# Patient Record
Sex: Male | Born: 1950 | Hispanic: No | Marital: Single | Smoking: Current every day smoker
Health system: Southern US, Community
[De-identification: ages and names within clinical notes are randomized; demographics above are authoritative.]

---

## 1999-02-02 ENCOUNTER — Encounter: Admission: RE | Admit: 1999-02-02 | Discharge: 1999-02-02 | Payer: Self-pay | Admitting: Neurosurgery

## 1999-02-02 ENCOUNTER — Encounter: Payer: Self-pay | Admitting: Neurosurgery

## 2009-04-27 ENCOUNTER — Ambulatory Visit: Payer: Self-pay | Admitting: Gastroenterology

## 2009-10-16 ENCOUNTER — Ambulatory Visit: Payer: Self-pay | Admitting: Family

## 2011-12-27 DIAGNOSIS — B1920 Unspecified viral hepatitis C without hepatic coma: Secondary | ICD-10-CM | POA: Insufficient documentation

## 2011-12-27 DIAGNOSIS — Z720 Tobacco use: Secondary | ICD-10-CM | POA: Insufficient documentation

## 2011-12-27 DIAGNOSIS — F1011 Alcohol abuse, in remission: Secondary | ICD-10-CM | POA: Insufficient documentation

## 2011-12-27 DIAGNOSIS — G8929 Other chronic pain: Secondary | ICD-10-CM | POA: Insufficient documentation

## 2012-06-28 ENCOUNTER — Ambulatory Visit: Payer: Self-pay | Admitting: Gastroenterology

## 2012-07-04 DIAGNOSIS — Z0189 Encounter for other specified special examinations: Secondary | ICD-10-CM | POA: Insufficient documentation

## 2013-07-05 DIAGNOSIS — Z800 Family history of malignant neoplasm of digestive organs: Secondary | ICD-10-CM | POA: Insufficient documentation

## 2013-09-09 ENCOUNTER — Ambulatory Visit: Payer: Self-pay | Admitting: Pain Medicine

## 2018-02-28 ENCOUNTER — Institutional Professional Consult (permissible substitution): Payer: Self-pay | Admitting: Internal Medicine

## 2018-03-13 ENCOUNTER — Ambulatory Visit
Admission: RE | Admit: 2018-03-13 | Discharge: 2018-03-13 | Disposition: A | Discharge status: Home / Self Care | Payer: Medicare HMO | Source: Ambulatory Visit | Attending: Pulmonary Disease | Admitting: Pulmonary Disease

## 2018-03-13 ENCOUNTER — Encounter: Payer: Self-pay | Admitting: Pulmonary Disease

## 2018-03-13 ENCOUNTER — Ambulatory Visit (INDEPENDENT_AMBULATORY_CARE_PROVIDER_SITE_OTHER): Payer: Medicare HMO | Admitting: Pulmonary Disease

## 2018-03-13 VITALS — BP 126/78 | HR 91 | Ht 76.0 in | Wt 149.0 lb

## 2018-03-13 DIAGNOSIS — F17210 Nicotine dependence, cigarettes, uncomplicated: Secondary | ICD-10-CM

## 2018-03-13 DIAGNOSIS — J439 Emphysema, unspecified: Secondary | ICD-10-CM

## 2018-03-13 DIAGNOSIS — R05 Cough: Secondary | ICD-10-CM

## 2018-03-13 DIAGNOSIS — R059 Cough, unspecified: Secondary | ICD-10-CM

## 2018-03-13 DIAGNOSIS — J449 Chronic obstructive pulmonary disease, unspecified: Secondary | ICD-10-CM | POA: Insufficient documentation

## 2018-03-13 DIAGNOSIS — R634 Abnormal weight loss: Secondary | ICD-10-CM

## 2018-03-13 DIAGNOSIS — Z7289 Other problems related to lifestyle: Secondary | ICD-10-CM

## 2018-03-13 DIAGNOSIS — Z789 Other specified health status: Secondary | ICD-10-CM

## 2018-03-13 MED ORDER — IPRATROPIUM-ALBUTEROL 20-100 MCG/ACT IN AERS: 1.0000 | INHALATION_SPRAY | Freq: Four times a day (QID) | RESPIRATORY_TRACT | 1 refills | Status: DC

## 2018-03-13 NOTE — Progress Notes (Signed)
Subjective:    Patient ID: Charles Ray, male    DOB: [birth year], 67 y.o.   MRN: [id number]  HPI The patient is 67 year old current smoker (1 PPD) who presents for evaluation of a cough that has been present for over a year.  He states that the cough is "constant" and is worse when he is moving around.  He states that albuterol improves it.  He does produce sputum particularly in the mornings this can be brownish to gray.  He has not seen any admixed blood.  Over the last 4 to 5 months he has lost approximately 20 pounds with no loss in appetite.  His last chest x-ray of record was in 2017.  He has had no chest imaging since.  He has never been given a diagnosis of COPD that he is aware of.  He has not had any orthopnea, paroxysmal nocturnal dyspnea or chest pain.  Does describe occasional lower extremity edema which usually resolves by elevating the lower extremities.  Does not endorse any fevers, chills or sweats.  He did does not endorse any other symptomatology.  Past medical history, surgical history and family history were reviewed and are as noted.  The patient smokes a pack of cigarettes per day has done so since age 45.  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  He has a dog as a pet no exotic pets or birds.  No unusual hobbies.  Has never been in the [employer].  He has lived in [place] all of his life.  He has not traveled outside the country no history of tuberculosis.  He does drink at least 3 glasses of "hard liquor" 3-4 times a week.   Review of Systems  Constitutional: Positive for unexpected weight change (Weight loss).  HENT: Negative.   Eyes: Negative.   Respiratory: Positive for cough.   Cardiovascular: Negative.   Gastrointestinal: Negative.   Endocrine: Negative.   Genitourinary: Negative.   Musculoskeletal: Negative.   Skin: Negative.   Allergic/Immunologic: Negative.   Neurological: Negative.   Hematological: Negative.     Psychiatric/Behavioral: Negative.   All other systems reviewed and are negative.      Objective:   Physical Exam  Constitutional: He is oriented to person, place, and time. He appears well-developed. He appears cachectic.  Non-toxic appearance. He appears ill (Chronically ill-appearing). No distress.  HENT:  Head: Normocephalic and atraumatic.  Right Ear: External ear normal.  Left Ear: External ear normal.  Nose: Nose normal.  Eyes: Pupils are equal, round, and reactive to light. EOM are normal. No scleral icterus.  Neck: Neck supple. No JVD present. No tracheal deviation present. No thyromegaly present.  Cardiovascular: Normal rate, regular rhythm, normal heart sounds and intact distal pulses.  Pulmonary/Chest: Effort normal and breath sounds normal. No respiratory distress.  Abdominal: Soft. He exhibits no distension.  Musculoskeletal: Normal range of motion. He exhibits no edema or tenderness.  Lymphadenopathy:    He has no cervical adenopathy.  Neurological: He is alert and oriented to person, place, and time.  No focal deficit  Skin: Skin is warm and dry. No rash noted. No erythema.  Psychiatric: He has a normal mood and affect. His behavior is normal. Judgment and thought content normal.    Spirometry was performed today shows no overt obstruction mid flows are reduced.  Cannot exclude mild concomitant restriction.  Chest x-ray was performed: Formal interpretation is pending but no overt masses noted.  Patient does have hyperinflation and interstitial changes consistent with chronic lung disease.      Assessment & Plan:   1.  COPD with chronic bronchitis and emphysema by clinical impression: Suspect that he has preserved FEV1 but need to obtain lung volumes, airway resistance and diffusion capacity determinations.  He will be scheduled for full PFTs.  Or now we will start Combivent 1 inhalation 4 times a day he appears to have improvement in his cough with  bronchodilators.  2.  Cough: This is likely due to poorly controlled COPD as well as constant irritation due to tobacco use.  Cannot exclude reflux associated with alcohol intake.  I have advised the patient to curtail cigarette smoking and to decrease alcohol intake.  He was also instructed on proper antireflux measures.  Started Combivent as above as he does note improvement with inhalers.  3.  Recent unexplained weight loss: Chest x-ray does not show evidence of mass however formal interpretation is pending.  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  4.  Tobacco dependence due to cigarettes: Patient was counseled with regards to discontinuation of smoking total time for counseling was 3 to 5 minutes.  5.  Alcohol use: Patient was advised to limit use of alcohol.  Thank you for allowing me to participate in this patient's care.

## 2018-03-13 NOTE — Patient Instructions (Addendum)
1.  You will be scheduled for breathing test.  2.  You will be scheduled to have a chest x-ray.  3.  We will see you in follow-up in 3 to 4 weeks time you are to contact us prior to that time should any new difficulties arise.

## 2018-03-29 ENCOUNTER — Ambulatory Visit: Payer: Medicare HMO | Attending: Pulmonary Disease

## 2018-03-29 DIAGNOSIS — J449 Chronic obstructive pulmonary disease, unspecified: Secondary | ICD-10-CM | POA: Insufficient documentation

## 2018-03-29 DIAGNOSIS — R05 Cough: Secondary | ICD-10-CM

## 2018-03-29 DIAGNOSIS — R059 Cough, unspecified: Secondary | ICD-10-CM

## 2018-03-29 MED ORDER — ALBUTEROL SULFATE (2.5 MG/3ML) 0.083% IN NEBU
2.5000 mg | INHALATION_SOLUTION | Freq: Once | RESPIRATORY_TRACT | Status: AC
  Administered 2018-03-29: 2.5 mg via RESPIRATORY_TRACT
  Filled 2018-03-29: qty 3

## 2018-04-05 ENCOUNTER — Ambulatory Visit: Payer: Medicare HMO | Admitting: Pulmonary Disease

## 2018-04-05 ENCOUNTER — Encounter: Payer: Self-pay | Admitting: Pulmonary Disease

## 2018-04-05 VITALS — BP 142/78 | HR 87 | Ht 76.0 in | Wt 157.0 lb

## 2018-04-05 DIAGNOSIS — R05 Cough: Secondary | ICD-10-CM | POA: Diagnosis not present

## 2018-04-05 DIAGNOSIS — R059 Cough, unspecified: Secondary | ICD-10-CM

## 2018-04-05 DIAGNOSIS — Z7289 Other problems related to lifestyle: Secondary | ICD-10-CM

## 2018-04-05 DIAGNOSIS — R634 Abnormal weight loss: Secondary | ICD-10-CM | POA: Diagnosis not present

## 2018-04-05 DIAGNOSIS — F17210 Nicotine dependence, cigarettes, uncomplicated: Secondary | ICD-10-CM

## 2018-04-05 DIAGNOSIS — Z789 Other specified health status: Secondary | ICD-10-CM

## 2018-04-05 DIAGNOSIS — J449 Chronic obstructive pulmonary disease, unspecified: Secondary | ICD-10-CM

## 2018-04-05 MED ORDER — UMECLIDINIUM-VILANTEROL 62.5-25 MCG/INH IN AEPB: 1.0000 | INHALATION_SPRAY | Freq: Every day | RESPIRATORY_TRACT | 6 refills | Status: DC

## 2018-04-05 NOTE — Progress Notes (Signed)
Patient seen in the office today and instructed on use of Anoro.  Patient expressed understanding and demonstrated technique. 

## 2018-04-05 NOTE — Progress Notes (Signed)
Subjective:    Patient ID: Charles Ray, male    DOB: Dec 20, 1950, 67 y.o.   MRN: [id number]  HPI The patient is 67 year old current smoker (1 PPD) who presents for follow-up of a cough that has been present for over a year.  We first evaluated him on 3 December. He states that the cough is "constant" and is worse when he is moving around. He states that albuterol improves it.  He does produce sputum particularly in the mornings this can be brownish to gray.  Since his prior visit he has been using albuterol regularly and has noted some improvement in his cough. He has not seen any hemoptysis.  Over the last 4 to 5 months he has lost approximately 20 pounds with no loss in appetite.  However, he is now regaining this weight. His last chest x-ray of record was in 2017, this was repeated on 3 December and showed findings consistent with emphysema but no mass. He has not had any orthopnea, paroxysmal nocturnal dyspnea or chest pain.  Does describe occasional lower extremity edema which usually resolves by elevating the lower extremities.  Does not endorse any fevers, chills or sweats.  He did does not endorse any other symptomatology.  He had pulmonary function testing on 19 December that shows mild obstructive defect by FEV1/FVC criteria, no significant bronchodilator response and moderate decrease in diffusion capacity.  This is consistent with emphysema with relatively preserved FEV1.  As noted previously he does consume alcohol daily.  Review of Systems 10 point review of systems was performed and is as noted above otherwise negative.    Objective:   Physical Exam  Constitutional: He is oriented to person, place, and time. He appears well-developed. He appears  thin.  Non-toxic appearance. He appears ill (Chronically ill-appearing), appears older than stated age. No distress.  HENT:  Head: Normocephalic and atraumatic.  Right Ear: External ear normal.  Left Ear: External ear normal.  Nose:  Nose normal.  Eyes: Pupils are equal, round, and reactive to light. EOM are normal. No scleral icterus.  Neck: Neck supple. No JVD present. No tracheal deviation present. No thyromegaly present.  Cardiovascular: Normal rate, regular rhythm, normal heart sounds and intact distal pulses.  Pulmonary/Chest: Effort normal and breath sounds normal. No respiratory distress.  Abdominal: Soft. He exhibits no distension.  Musculoskeletal: Normal range of motion. He exhibits no edema or tenderness.  Lymphadenopathy:    He has no cervical adenopathy.  Neurological: He is alert and oriented to person, place, and time.  No focal deficit  Skin: Skin is warm and dry. No rash noted. No erythema.  Psychiatric: He has a normal mood and affect. His behavior is normal. Judgment and thought content normal.       Assessment & Plan:   1.  COPD with chronic bronchitis and emphysema by clinical impression:  He has mild struct of defect by FEV1/FVC criteria.  Mild to moderate diffusion capacity impairment, consistent with emphysema with relatively preserved FEV1.  He did not get his Combivent inhaler filled.  We will discontinue this and start Anoro Ellipta, 1 inhalation daily.  The patient was taught the use of the dry powder inhaler as well as was provided with a coupon for a free month's worth.  We will see him in follow-up in 3 months time he is to contact us prior to that time should any new difficulties arise.  2.  Cough:  His cough is somewhat improved with use of albuterol  hopefully Anora will help ameliorate the symptom.  It is highly recommended that he discontinue smoking and this was discussed with him as he will continue to irritate his airway and likely perpetuate the symptom.  3.  Recent unexplained weight loss: Chest x-ray does not show evidence of mass he does have issues with emphysema.  He has started to regain lost weight.  His appetite is good.  4.  Tobacco dependence due to cigarettes: Patient  was counseled with regards to discontinuation of smoking total time for counseling was 3 to 5 minutes.  The patient's wife was also involved in this counseling as she is also smoking.  Recommended that both try to discontinue this habit together.  5.  Alcohol use: Patient was advised to limit use of alcohol.

## 2018-04-05 NOTE — Patient Instructions (Signed)
Anoro 1 puff daily  Follow up in 3 months

## 2018-09-20 ENCOUNTER — Encounter: Payer: Self-pay | Admitting: Emergency Medicine

## 2018-09-20 ENCOUNTER — Emergency Department
Admission: EM | Admit: 2018-09-20 | Discharge: 2018-09-20 | Disposition: A | Discharge status: Home / Self Care | Payer: Medicare HMO | Attending: Emergency Medicine | Admitting: Emergency Medicine

## 2018-09-20 ENCOUNTER — Emergency Department: Payer: Medicare HMO

## 2018-09-20 ENCOUNTER — Other Ambulatory Visit: Payer: Self-pay

## 2018-09-20 DIAGNOSIS — Z79899 Other long term (current) drug therapy: Secondary | ICD-10-CM | POA: Diagnosis not present

## 2018-09-20 DIAGNOSIS — F17210 Nicotine dependence, cigarettes, uncomplicated: Secondary | ICD-10-CM | POA: Diagnosis not present

## 2018-09-20 DIAGNOSIS — R531 Weakness: Secondary | ICD-10-CM | POA: Diagnosis not present

## 2018-09-20 DIAGNOSIS — M25552 Pain in left hip: Secondary | ICD-10-CM | POA: Diagnosis present

## 2018-09-20 LAB — BASIC METABOLIC PANEL
Anion gap: 6 (ref 5–15)
BUN: 28 mg/dL — ABNORMAL HIGH (ref 8–23)
CO2: 27 mmol/L (ref 22–32)
Calcium: 9 mg/dL (ref 8.9–10.3)
Chloride: 99 mmol/L (ref 98–111)
Creatinine, Ser: 0.99 mg/dL (ref 0.61–1.24)
GFR calc Af Amer: >60 mL/min (ref >60)
GFR calc non Af Amer: >60 mL/min (ref >60)
Glucose, Bld: 97 mg/dL (ref 70–99)
Potassium: 4.4 mmol/L (ref 3.5–5.1)
Sodium: 132 mmol/L — ABNORMAL LOW (ref 135–145)

## 2018-09-20 LAB — URINALYSIS, COMPLETE (UACMP) WITH MICROSCOPIC
Bacteria, UA: NONE SEEN
Bilirubin Urine: NEGATIVE
Glucose, UA: NEGATIVE mg/dL
Hgb urine dipstick: NEGATIVE
Ketones, ur: 5 mg/dL — AB
Leukocytes,Ua: NEGATIVE
Nitrite: NEGATIVE
Protein, ur: NEGATIVE mg/dL
Specific Gravity, Urine: 1.02 (ref 1.005–1.030)
Squamous Epithelial / HPF: NONE SEEN (ref 0–5)
pH: 5 (ref 5.0–8.0)

## 2018-09-20 LAB — CBC
HCT: 44.2 % (ref 39.0–52.0)
Hemoglobin: 14.8 g/dL (ref 13.0–17.0)
MCH: 31.2 pg (ref 26.0–34.0)
MCHC: 33.5 g/dL (ref 30.0–36.0)
MCV: 93.2 fL (ref 80.0–100.0)
Platelets: 357 10*3/uL (ref 150–400)
RBC: 4.74 MIL/uL (ref 4.22–5.81)
RDW: 14.3 % (ref 11.5–15.5)
WBC: 9 10*3/uL (ref 4.0–10.5)
nRBC: 0 % (ref 0.0–0.2)

## 2018-09-20 LAB — TROPONIN I: Troponin I: <0.03 ng/mL (ref <0.03)

## 2018-09-20 MED ORDER — SODIUM CHLORIDE 0.9 % IV BOLUS
1000.0000 mL | Freq: Once | INTRAVENOUS | Status: AC
  Administered 2018-09-20: 1000 mL via INTRAVENOUS

## 2018-09-20 NOTE — ED Provider Notes (Signed)
[facility] Emergency Department Provider Note   ____________________________________________   I have reviewed the triage vital signs and the nursing notes.   HISTORY  Chief Complaint Joint pain  History limited by: Not Limited   HPI Charles Ray is a 68 y.o. male who presents to the emergency department today with chief complaint of left hip and right knee pain.  The patient states he recently saw an orthopedic doctor for this and actually had injections performed.  However when asked about weakness he states he is also been feeling slightly weaker recently.  He states that he has had some falls but this is been happening for a long time.  He denies any recent fevers, chest pain or shortness of breath.   Records reviewed. Per medical record review patient has a history of recent visit to orthopedic surgeon for joint complaints.   History reviewed. No pertinent past medical history.  Patient Active Problem List   Diagnosis Date Noted  . Family history of colon cancer 07/05/2013  . Tests ordered 07/04/2012  . Hepatitis C 12/27/2011  . History of alcohol abuse 12/27/2011  . Tobacco use 12/27/2011  . Chronic pain 12/27/2011    History reviewed. No pertinent surgical history.  Prior to Admission medications   Medication Sig Start Date End Date Taking? Authorizing Provider  albuterol (VENTOLIN HFA) 108 (90 Base) MCG/ACT inhaler TWO PUFFS EVERY 4 HOURS AS NEEDED FOR WHEEZE AND COUGH. 10/31/17   [provider]  amitriptyline (ELAVIL) 150 MG tablet Take 2 tablets by mouth at bedtime. 09/08/17   [provider]  fluticasone (FLONASE) 50 MCG/ACT nasal spray Place into the nose. 09/06/17 09/06/18  [provider]  meloxicam (MOBIC) 15 MG tablet TAKE 1 TABLET BY MOUTH EVERY DAY 11/08/17   [provider]  Multiple Vitamin (MULTIVITAMIN) capsule Take by mouth.    [provider]  umeclidinium-vilanterol (ANORO ELLIPTA)  62.5-25 MCG/INH AEPB Inhale 1 puff into the lungs daily. 04/05/18   Salena SanerGonzalez, Carmen L, MD    Allergies Patient has no known allergies.  History reviewed. No pertinent family history.  Social History Social History   Tobacco Use  . Smoking status: Current Every Day Smoker    Packs/day: 1.00    Years: 50.00    Pack years: 50.00    Types: Cigarettes  . Smokeless tobacco: Never Used  Substance Use Topics  . Alcohol use: Yes    Alcohol/week: 3.0 standard drinks    Types: 3 Shots of liquor per week    Comment: 3 shots every evening  . Drug use: Never    Review of Systems Constitutional: No fever/chills. Positive for generalized weakness and falls.  Eyes: No visual changes. ENT: No sore throat. Cardiovascular: Denies chest pain. Respiratory: Denies shortness of breath. Gastrointestinal: No abdominal pain.  No nausea, no vomiting.  No diarrhea.   Genitourinary: Negative for dysuria. Musculoskeletal: Positive for left hip and right knee pain.  Skin: Negative for rash. Neurological: Negative for headaches, focal weakness or numbness.  ____________________________________________   PHYSICAL EXAM:  VITAL SIGNS: ED Triage Vitals  Enc Vitals Group     BP 09/20/18 1730 (!) 133/100     Pulse Rate 09/20/18 1728 81     Resp 09/20/18 1728 16     Temp 09/20/18 1728 97.9 F (36.6 C)     Temp Source 09/20/18 1728 Oral     SpO2 09/20/18 1632 96 %     Weight 09/20/18 1729 168 lb (76.2 kg)  Height 09/20/18 1729 6\' 4"  (1.93 m)     Head Circumference --      Peak Flow --      Pain Score 09/20/18 1728 0   Constitutional: Alert and oriented.  Eyes: Conjunctivae are normal.  ENT      Head: Normocephalic and atraumatic.      Nose: No congestion/rhinnorhea.      Mouth/Throat: Mucous membranes are moist.      Neck: No stridor. Hematological/Lymphatic/Immunilogical: No cervical lymphadenopathy. Cardiovascular: Normal rate, regular rhythm.  No murmurs, rubs, or gallops.   Respiratory: Normal respiratory effort without tachypnea nor retractions. Breath sounds are clear and equal bilaterally. No wheezes/rales/rhonchi. Gastrointestinal: Soft and non tender. No rebound. No guarding.  Genitourinary: Deferred Musculoskeletal: Normal range of motion in all extremities. No lower extremity edema. Neurologic:  Normal speech and language. No gross focal neurologic deficits are appreciated.  Skin:  Skin is warm, dry and intact. No rash noted. Psychiatric: Mood and affect are normal. Speech and behavior are normal. Patient exhibits appropriate insight and judgment.  ____________________________________________    LABS (pertinent positives/negatives)  Trop <0.03 BMP na 132, BUN 28 CBC wbc 9.0, hgb 14.8, plt 357 UA clear, ketones 5 otherwise unremarkable  ____________________________________________   EKG  I, Nance Pear, attending physician, personally viewed and interpreted this EKG  EKG Time: 1735 Rate: 77 Rhythm: normal sinus rhythm Axis: left axis deviation Intervals: qtc 448 QRS: narrow ST changes: no st elevation Impression: abnormal ekg   ____________________________________________    RADIOLOGY  CT head No acute abnormality  ____________________________________________   PROCEDURES  Procedures  ____________________________________________   INITIAL IMPRESSION / ASSESSMENT AND PLAN / ED COURSE  Pertinent labs & imaging results that were available during my care of the patient were reviewed by me and considered in my medical decision making (see chart for details).   Patient presented to the emergency department today with main complaint of joint pain although it appears that he really came to the emergency department today for weakness.  In terms of the joint pain patient is already following up with orthopedic surgery and recently had injections placed.  Do feel he can continue to follow-up.  No indication of joint infection.   In terms of the patient's weakness sodium level is slightly decreased.  Did wonder if patient was slightly dehydrated.  Patient was given IV fluids and said he felt better.  He did request discharge.  He was able to get up and ambulate with some help from nursing staff (patient states he normally uses a cane.)  Discussed with patient importance of following up with primary care.  ____________________________________________   FINAL CLINICAL IMPRESSION(S) / ED DIAGNOSES  Final diagnoses:  Weakness     Note: This dictation was prepared with Dragon dictation. Any transcriptional errors that result from this process are unintentional     Nance Pear, MD 09/20/18 2241

## 2018-09-20 NOTE — ED Triage Notes (Addendum)
Generalized weakness over past week.  Has fallen multiple times this week.  Today had trouble getting up so wife made pt come to ED.  No pain.  Does not feel dizzy or lightheaded and describes weakness as all over.  Unlabored.  Oriented X4.  Per pt no known medical problems. No arm drift. Speech clear.

## 2018-09-20 NOTE — ED Notes (Signed)
Pt to CT

## 2018-09-20 NOTE — ED Triage Notes (Signed)
Pt presents w/ c/o weakness starting today. Pt has had multiple falls in past 4 months. Pt fell this morning, injuring L forehead, hematoma, orthostatic for EMS, NSR. CSS negative, not anticoagulated.

## 2018-09-20 NOTE — Discharge Instructions (Addendum)
Please seek medical attention for any high fevers, chest pain, shortness of breath, change in behavior, persistent vomiting, bloody stool or any other new or concerning symptoms.  

## 2019-03-21 DIAGNOSIS — F17210 Nicotine dependence, cigarettes, uncomplicated: Secondary | ICD-10-CM | POA: Diagnosis not present

## 2019-03-21 DIAGNOSIS — Z79899 Other long term (current) drug therapy: Secondary | ICD-10-CM | POA: Diagnosis not present

## 2019-03-21 DIAGNOSIS — R634 Abnormal weight loss: Secondary | ICD-10-CM | POA: Diagnosis not present

## 2019-03-21 DIAGNOSIS — R300 Dysuria: Secondary | ICD-10-CM | POA: Diagnosis not present

## 2019-03-21 DIAGNOSIS — R3915 Urgency of urination: Secondary | ICD-10-CM | POA: Diagnosis not present

## 2019-03-21 DIAGNOSIS — R0600 Dyspnea, unspecified: Secondary | ICD-10-CM | POA: Diagnosis not present

## 2019-03-21 DIAGNOSIS — Z681 Body mass index (BMI) 19 or less, adult: Secondary | ICD-10-CM | POA: Diagnosis not present

## 2019-03-21 DIAGNOSIS — R350 Frequency of micturition: Secondary | ICD-10-CM | POA: Diagnosis not present

## 2019-03-21 DIAGNOSIS — Z125 Encounter for screening for malignant neoplasm of prostate: Secondary | ICD-10-CM | POA: Diagnosis not present

## 2019-03-21 DIAGNOSIS — R0609 Other forms of dyspnea: Secondary | ICD-10-CM | POA: Diagnosis not present

## 2019-10-01 DIAGNOSIS — H353132 Nonexudative age-related macular degeneration, bilateral, intermediate dry stage: Secondary | ICD-10-CM | POA: Diagnosis not present

## 2019-10-01 DIAGNOSIS — H5213 Myopia, bilateral: Secondary | ICD-10-CM | POA: Diagnosis not present

## 2019-10-01 DIAGNOSIS — H524 Presbyopia: Secondary | ICD-10-CM | POA: Diagnosis not present

## 2019-10-01 DIAGNOSIS — H2513 Age-related nuclear cataract, bilateral: Secondary | ICD-10-CM | POA: Diagnosis not present

## 2019-10-01 DIAGNOSIS — H532 Diplopia: Secondary | ICD-10-CM | POA: Diagnosis not present

## 2019-10-01 DIAGNOSIS — Z0100 Encounter for examination of eyes and vision without abnormal findings: Secondary | ICD-10-CM | POA: Diagnosis not present

## 2019-10-01 DIAGNOSIS — H43823 Vitreomacular adhesion, bilateral: Secondary | ICD-10-CM | POA: Diagnosis not present

## 2020-06-18 ENCOUNTER — Emergency Department: Payer: Medicare HMO

## 2020-06-18 ENCOUNTER — Inpatient Hospital Stay
Admission: EM | Admit: 2020-06-18 | Discharge: 2020-06-29 | DRG: 522 | Disposition: A | Discharge status: Skilled Nursing Facility | Payer: Medicare HMO | Attending: Hospitalist | Admitting: Hospitalist

## 2020-06-18 ENCOUNTER — Other Ambulatory Visit: Payer: Self-pay

## 2020-06-18 DIAGNOSIS — Z720 Tobacco use: Secondary | ICD-10-CM | POA: Diagnosis present

## 2020-06-18 DIAGNOSIS — S7292XA Unspecified fracture of left femur, initial encounter for closed fracture: Secondary | ICD-10-CM | POA: Diagnosis present

## 2020-06-18 DIAGNOSIS — S72042A Displaced fracture of base of neck of left femur, initial encounter for closed fracture: Secondary | ICD-10-CM | POA: Diagnosis not present

## 2020-06-18 DIAGNOSIS — F1010 Alcohol abuse, uncomplicated: Secondary | ICD-10-CM | POA: Diagnosis not present

## 2020-06-18 DIAGNOSIS — W19XXXA Unspecified fall, initial encounter: Secondary | ICD-10-CM

## 2020-06-18 DIAGNOSIS — M1612 Unilateral primary osteoarthritis, left hip: Secondary | ICD-10-CM | POA: Diagnosis present

## 2020-06-18 DIAGNOSIS — S72002D Fracture of unspecified part of neck of left femur, subsequent encounter for closed fracture with routine healing: Secondary | ICD-10-CM | POA: Diagnosis not present

## 2020-06-18 DIAGNOSIS — Z743 Need for continuous supervision: Secondary | ICD-10-CM | POA: Diagnosis not present

## 2020-06-18 DIAGNOSIS — R636 Underweight: Secondary | ICD-10-CM | POA: Diagnosis present

## 2020-06-18 DIAGNOSIS — M25552 Pain in left hip: Secondary | ICD-10-CM | POA: Diagnosis present

## 2020-06-18 DIAGNOSIS — Z79899 Other long term (current) drug therapy: Secondary | ICD-10-CM

## 2020-06-18 DIAGNOSIS — S72032A Displaced midcervical fracture of left femur, initial encounter for closed fracture: Principal | ICD-10-CM | POA: Diagnosis present

## 2020-06-18 DIAGNOSIS — J439 Emphysema, unspecified: Secondary | ICD-10-CM | POA: Diagnosis not present

## 2020-06-18 DIAGNOSIS — R5381 Other malaise: Secondary | ICD-10-CM | POA: Diagnosis not present

## 2020-06-18 DIAGNOSIS — R279 Unspecified lack of coordination: Secondary | ICD-10-CM | POA: Diagnosis not present

## 2020-06-18 DIAGNOSIS — F17210 Nicotine dependence, cigarettes, uncomplicated: Secondary | ICD-10-CM | POA: Diagnosis present

## 2020-06-18 DIAGNOSIS — Z681 Body mass index (BMI) 19 or less, adult: Secondary | ICD-10-CM | POA: Diagnosis not present

## 2020-06-18 DIAGNOSIS — Y9301 Activity, walking, marching and hiking: Secondary | ICD-10-CM | POA: Diagnosis present

## 2020-06-18 DIAGNOSIS — Y92480 Sidewalk as the place of occurrence of the external cause: Secondary | ICD-10-CM

## 2020-06-18 DIAGNOSIS — W010XXA Fall on same level from slipping, tripping and stumbling without subsequent striking against object, initial encounter: Secondary | ICD-10-CM | POA: Diagnosis present

## 2020-06-18 DIAGNOSIS — F32A Depression, unspecified: Secondary | ICD-10-CM | POA: Diagnosis present

## 2020-06-18 DIAGNOSIS — Z96642 Presence of left artificial hip joint: Secondary | ICD-10-CM | POA: Diagnosis not present

## 2020-06-18 DIAGNOSIS — S72022A Displaced fracture of epiphysis (separation) (upper) of left femur, initial encounter for closed fracture: Secondary | ICD-10-CM

## 2020-06-18 DIAGNOSIS — G8918 Other acute postprocedural pain: Secondary | ICD-10-CM | POA: Diagnosis not present

## 2020-06-18 DIAGNOSIS — Z20822 Contact with and (suspected) exposure to covid-19: Secondary | ICD-10-CM | POA: Diagnosis present

## 2020-06-18 DIAGNOSIS — Z419 Encounter for procedure for purposes other than remedying health state, unspecified: Secondary | ICD-10-CM

## 2020-06-18 DIAGNOSIS — S72002A Fracture of unspecified part of neck of left femur, initial encounter for closed fracture: Secondary | ICD-10-CM | POA: Diagnosis not present

## 2020-06-18 DIAGNOSIS — Z471 Aftercare following joint replacement surgery: Secondary | ICD-10-CM | POA: Diagnosis not present

## 2020-06-18 LAB — CBC WITH DIFFERENTIAL/PLATELET
Abs Immature Granulocytes: 0.09 10*3/uL — ABNORMAL HIGH (ref 0.00–0.07)
Basophils Absolute: 0.1 10*3/uL (ref 0.0–0.1)
Basophils Relative: 1 %
Eosinophils Absolute: 0.2 10*3/uL (ref 0.0–0.5)
Eosinophils Relative: 2 %
HCT: 37.9 % — ABNORMAL LOW (ref 39.0–52.0)
Hemoglobin: 12.6 g/dL — ABNORMAL LOW (ref 13.0–17.0)
Immature Granulocytes: 1 %
Lymphocytes Relative: 10 %
Lymphs Abs: 1.2 10*3/uL (ref 0.7–4.0)
MCH: 31.2 pg (ref 26.0–34.0)
MCHC: 33.2 g/dL (ref 30.0–36.0)
MCV: 93.8 fL (ref 80.0–100.0)
Monocytes Absolute: 1 10*3/uL (ref 0.1–1.0)
Monocytes Relative: 8 %
Neutro Abs: 9.6 10*3/uL — ABNORMAL HIGH (ref 1.7–7.7)
Neutrophils Relative %: 78 %
Platelets: 375 10*3/uL (ref 150–400)
RBC: 4.04 MIL/uL — ABNORMAL LOW (ref 4.22–5.81)
RDW: 14.2 % (ref 11.5–15.5)
WBC: 12.2 10*3/uL — ABNORMAL HIGH (ref 4.0–10.5)
nRBC: 0 % (ref 0.0–0.2)

## 2020-06-18 LAB — BASIC METABOLIC PANEL
Anion gap: 8 (ref 5–15)
BUN: 40 mg/dL — ABNORMAL HIGH (ref 8–23)
CO2: 26 mmol/L (ref 22–32)
Calcium: 8.7 mg/dL — ABNORMAL LOW (ref 8.9–10.3)
Chloride: 104 mmol/L (ref 98–111)
Creatinine, Ser: 1.02 mg/dL (ref 0.61–1.24)
GFR, Estimated: >60 mL/min (ref >60)
Glucose, Bld: 93 mg/dL (ref 70–99)
Potassium: 4.6 mmol/L (ref 3.5–5.1)
Sodium: 138 mmol/L (ref 135–145)

## 2020-06-18 LAB — COMPREHENSIVE METABOLIC PANEL
ALT: 16 U/L (ref 0–44)
AST: 17 U/L (ref 15–41)
Albumin: 3.4 g/dL — ABNORMAL LOW (ref 3.5–5.0)
Alkaline Phosphatase: 106 U/L (ref 38–126)
Anion gap: 7 (ref 5–15)
BUN: 41 mg/dL — ABNORMAL HIGH (ref 8–23)
CO2: 27 mmol/L (ref 22–32)
Calcium: 8.8 mg/dL — ABNORMAL LOW (ref 8.9–10.3)
Chloride: 104 mmol/L (ref 98–111)
Creatinine, Ser: 1.03 mg/dL (ref 0.61–1.24)
GFR, Estimated: >60 mL/min (ref >60)
Glucose, Bld: 103 mg/dL — ABNORMAL HIGH (ref 70–99)
Potassium: 4.2 mmol/L (ref 3.5–5.1)
Sodium: 138 mmol/L (ref 135–145)
Total Bilirubin: 0.5 mg/dL (ref 0.3–1.2)
Total Protein: 6.7 g/dL (ref 6.5–8.1)

## 2020-06-18 LAB — CBC
HCT: 39.2 % (ref 39.0–52.0)
Hemoglobin: 13.2 g/dL (ref 13.0–17.0)
MCH: 31.2 pg (ref 26.0–34.0)
MCHC: 33.7 g/dL (ref 30.0–36.0)
MCV: 92.7 fL (ref 80.0–100.0)
Platelets: 366 10*3/uL (ref 150–400)
RBC: 4.23 MIL/uL (ref 4.22–5.81)
RDW: 14.2 % (ref 11.5–15.5)
WBC: 14.2 10*3/uL — ABNORMAL HIGH (ref 4.0–10.5)
nRBC: 0 % (ref 0.0–0.2)

## 2020-06-18 LAB — RESP PANEL BY RT-PCR (FLU A&B, COVID) ARPGX2
Influenza A by PCR: NEGATIVE
Influenza B by PCR: NEGATIVE
SARS Coronavirus 2 by RT PCR: NEGATIVE

## 2020-06-18 LAB — PHOSPHORUS: Phosphorus: 2.9 mg/dL (ref 2.5–4.6)

## 2020-06-18 LAB — PROTIME-INR
INR: 1 (ref 0.8–1.2)
Prothrombin Time: 12.4 seconds (ref 11.4–15.2)

## 2020-06-18 LAB — MAGNESIUM: Magnesium: 2.1 mg/dL (ref 1.7–2.4)

## 2020-06-18 MED ORDER — MORPHINE SULFATE (PF) 2 MG/ML IV SOLN
0.5000 mg | INTRAVENOUS | Status: DC | PRN
  Administered 2020-06-18 – 2020-06-19 (×3): 0.5 mg via INTRAVENOUS
  Filled 2020-06-18 (×3): qty 1

## 2020-06-18 MED ORDER — MULTIVITAMINS PO CAPS: 1.0000 | ORAL_CAPSULE | Freq: Every day | ORAL | Status: DC

## 2020-06-18 MED ORDER — LORAZEPAM 1 MG PO TABS: 1.0000 mg | ORAL_TABLET | ORAL | Status: AC | PRN

## 2020-06-18 MED ORDER — HYDROCODONE-ACETAMINOPHEN 5-325 MG PO TABS
1.0000 | ORAL_TABLET | Freq: Four times a day (QID) | ORAL | Status: DC | PRN
  Administered 2020-06-18 – 2020-06-24 (×10): 1 via ORAL
  Administered 2020-06-25: 2 via ORAL
  Administered 2020-06-25: 1 via ORAL
  Administered 2020-06-26: 2 via ORAL
  Administered 2020-06-26 – 2020-06-27 (×2): 1 via ORAL
  Administered 2020-06-28: 2 via ORAL
  Filled 2020-06-18 (×2): qty 1
  Filled 2020-06-18 (×2): qty 2
  Filled 2020-06-18 (×3): qty 1
  Filled 2020-06-18: qty 2
  Filled 2020-06-18 (×2): qty 1
  Filled 2020-06-18: qty 2
  Filled 2020-06-18 (×4): qty 1

## 2020-06-18 MED ORDER — THIAMINE HCL 100 MG/ML IJ SOLN
100.0000 mg | Freq: Every day | INTRAMUSCULAR | Status: DC
  Administered 2020-06-21 – 2020-06-26 (×2): 100 mg via INTRAVENOUS
  Filled 2020-06-18 (×3): qty 2

## 2020-06-18 MED ORDER — AMITRIPTYLINE HCL 100 MG PO TABS
300.0000 mg | ORAL_TABLET | Freq: Every day | ORAL | Status: DC
Start: 2020-06-18 — End: 2020-06-29
  Administered 2020-06-18 – 2020-06-28 (×11): 300 mg via ORAL
  Filled 2020-06-18 (×14): qty 3

## 2020-06-18 MED ORDER — FOLIC ACID 1 MG PO TABS
1.0000 mg | ORAL_TABLET | Freq: Every day | ORAL | Status: DC
  Administered 2020-06-20 – 2020-06-29 (×10): 1 mg via ORAL
  Filled 2020-06-18 (×9): qty 1

## 2020-06-18 MED ORDER — THIAMINE HCL 100 MG PO TABS
100.0000 mg | ORAL_TABLET | Freq: Every day | ORAL | Status: DC
  Administered 2020-06-20 – 2020-06-29 (×8): 100 mg via ORAL
  Filled 2020-06-18 (×9): qty 1

## 2020-06-18 MED ORDER — POLYETHYLENE GLYCOL 3350 17 G PO PACK: 17.0000 g | PACK | Freq: Every day | ORAL | Status: DC | PRN

## 2020-06-18 MED ORDER — NICOTINE 21 MG/24HR TD PT24: 21.0000 mg | MEDICATED_PATCH | Freq: Every day | TRANSDERMAL | Status: AC | PRN

## 2020-06-18 MED ORDER — ALBUTEROL SULFATE HFA 108 (90 BASE) MCG/ACT IN AERS
2.0000 | INHALATION_SPRAY | Freq: Four times a day (QID) | RESPIRATORY_TRACT | Status: DC | PRN
  Filled 2020-06-18: qty 6.7

## 2020-06-18 MED ORDER — ADULT MULTIVITAMIN W/MINERALS CH
1.0000 | ORAL_TABLET | Freq: Every day | ORAL | Status: DC
  Administered 2020-06-20 – 2020-06-29 (×10): 1 via ORAL
  Filled 2020-06-18 (×10): qty 1

## 2020-06-18 MED ORDER — CEFAZOLIN SODIUM-DEXTROSE 2-4 GM/100ML-% IV SOLN
2.0000 g | Freq: Once | INTRAVENOUS | Status: AC
  Administered 2020-06-19: 2 g via INTRAVENOUS
  Filled 2020-06-18: qty 100

## 2020-06-18 MED ORDER — LORAZEPAM 2 MG/ML IJ SOLN: 1.0000 mg | INTRAMUSCULAR | Status: AC | PRN

## 2020-06-18 NOTE — ED Notes (Signed)
ED Provider at bedside. 

## 2020-06-18 NOTE — Progress Notes (Signed)
Full consult note and discussion with patient to follow tomorrow AM.  Called by ED staff. Imaging reviewed.  - Plan for surgery (hip hemiarthroplasty) tomorrow.  - NPO after midnight - Hold anticoagulation - Admit to Hospitalist team.

## 2020-06-18 NOTE — ED Notes (Signed)
Patient transported tofloor by Kory, RN.  

## 2020-06-18 NOTE — ED Triage Notes (Signed)
Pt fell stepping up a step going into a store and onto left hip. PT co left hip pain states not able to walk on it since injury. Denies any head or neck injury. No other complaints.

## 2020-06-18 NOTE — ED Notes (Signed)
Patient ok to eat per Dr. Sedalia Muta, admitting provider. Patient to be NPO at midnight.

## 2020-06-18 NOTE — ED Provider Notes (Signed)
[facility] Emergency Department Provider Note ____________________________________________   Event Date/Time   First MD Initiated Contact with Patient 06/18/20 2040     (approximate)  I have reviewed the triage vital signs and the nursing notes.   HISTORY  Chief Complaint Hip Pain    HPI ADDISON WHIDBEE is a 70 y.o. male with PMH as noted below who presents with left hip pain acute onset within the last hour when he tripped on a curb and fell onto that side.  He reports pain radiating towards the knee.  He did not hit his head and had no LOC.  He denies neck or back pain, and has no other injuries.  He has not been able to put any weight on that leg.    No past medical history on file.  Patient Active Problem List   Diagnosis Date Noted  . Family history of colon cancer 07/05/2013  . Tests ordered 07/04/2012  . Hepatitis C 12/27/2011  . History of alcohol abuse 12/27/2011  . Tobacco use 12/27/2011  . Chronic pain 12/27/2011    No past surgical history on file.  Prior to Admission medications   Medication Sig Start Date End Date Taking? Authorizing Provider  albuterol (VENTOLIN HFA) 108 (90 Base) MCG/ACT inhaler TWO PUFFS EVERY 4 HOURS AS NEEDED FOR WHEEZE AND COUGH. 10/31/17   [provider]  amitriptyline (ELAVIL) 150 MG tablet Take 2 tablets by mouth at bedtime. 09/08/17   [provider]  fluticasone (FLONASE) 50 MCG/ACT nasal spray Place into the nose. 09/06/17 09/06/18  [provider]  meloxicam (MOBIC) 15 MG tablet TAKE 1 TABLET BY MOUTH EVERY DAY 11/08/17   [provider]  Multiple Vitamin (MULTIVITAMIN) capsule Take by mouth.    [provider]  umeclidinium-vilanterol (ANORO ELLIPTA) 62.5-25 MCG/INH AEPB Inhale 1 puff into the lungs daily. 04/05/18   Salena Saner, MD    Allergies Patient has no known allergies.  No family history on file.  Social History Social History   Tobacco  Use  . Smoking status: Current Every Day Smoker    Packs/day: 1.00    Years: 50.00    Pack years: 50.00    Types: Cigarettes  . Smokeless tobacco: Never Used  Substance Use Topics  . Alcohol use: Yes    Alcohol/week: 3.0 standard drinks    Types: 3 Shots of liquor per week    Comment: 3 shots every evening  . Drug use: Never    Review of Systems  Constitutional: No fever. Eyes: No visual changes. ENT: No sore throat. Cardiovascular: Denies chest pain. Respiratory: Denies shortness of breath. Gastrointestinal: No vomiting. Genitourinary: Negative for flank pain. Musculoskeletal: Positive for left hip pain. Skin: Negative for rash. Neurological: Negative for focal weakness or numbness.   ____________________________________________   PHYSICAL EXAM:  VITAL SIGNS: ED Triage Vitals  Enc Vitals Group     BP 06/18/20 1941 104/73     Pulse Rate 06/18/20 1941 82     Resp 06/18/20 1941 20     Temp 06/18/20 1941 97.7 F (36.5 C)     Temp Source 06/18/20 1941 Oral     SpO2 06/18/20 1941 94 %     Weight 06/18/20 1944 168 lb (76.2 kg)     Height 06/18/20 1944 6\' 5"  (1.956 m)     Head Circumference --      Peak Flow --      Pain Score 06/18/20 1944 7  Pain Loc --      Pain Edu? --      Excl. in GC? --     Constitutional: Alert and oriented.  Uncomfortable appearing but in no acute distress. Eyes: Conjunctivae are normal.  Head: Atraumatic. Nose: No congestion/rhinnorhea. Mouth/Throat: Mucous membranes are moist.   Neck: Normal range of motion.  Cardiovascular: Normal rate, regular rhythm.  Good peripheral circulation. Respiratory: Normal respiratory effort.  No retractions.  Gastrointestinal: No distention.  Musculoskeletal: No lower extremity edema.  Extremities warm and well perfused.  Pain on range of motion of left hip.  Slight shortening to the left leg. Neurologic:  Normal speech and language. No gross focal neurologic deficits are appreciated.  Skin:  Skin  is warm and dry. No rash noted. Psychiatric: Mood and affect are normal. Speech and behavior are normal.  ____________________________________________   LABS (all labs ordered are listed, but only abnormal results are displayed)  Labs Reviewed  BASIC METABOLIC PANEL - Abnormal; Notable for the following components:      Result Value   BUN 40 (*)    Calcium 8.7 (*)    All other components within normal limits  CBC WITH DIFFERENTIAL/PLATELET - Abnormal; Notable for the following components:   WBC 12.2 (*)    RBC 4.04 (*)    Hemoglobin 12.6 (*)    HCT 37.9 (*)    Neutro Abs 9.6 (*)    Abs Immature Granulocytes 0.09 (*)    All other components within normal limits  RESP PANEL BY RT-PCR (FLU A&B, COVID) ARPGX2  PROTIME-INR   ____________________________________________  EKG   ____________________________________________  RADIOLOGY  XR L hip interpreted by me shows transcervical femur fracture Chest x-ray interpreted by me shows no focal infiltrate or edema  ____________________________________________   PROCEDURES  Procedure(s) performed: No  Procedures  Critical Care performed: No ____________________________________________   INITIAL IMPRESSION / ASSESSMENT AND PLAN / ED COURSE  Pertinent labs & imaging results that were available during my care of the patient were reviewed by me and considered in my medical decision making (see chart for details).  70 year old male with PMH as noted above presents with left hip pain after a mechanical fall from standing height.  He denies hitting his head and has no other injuries.  He was unable to bear weight on the left leg.  On exam, he is uncomfortable but overall well-appearing.  His vital signs are normal.  Physical exam is remarkable for shortening of the left leg and pain on range of motion of the left hip.  Neurologic exam is nonfocal.  Presentation is concerning for a hip fracture.  Will obtain x-rays and  reassess.  ----------------------------------------- 9:29 PM on 06/18/2020 -----------------------------------------  X-rays confirm a transcervical left femur fracture.  I consulted Dr. Allena Katz from orthopedics.  I then consulted Dr. Sedalia Muta from the hospitalist service for admission.  ____________________________________________   FINAL CLINICAL IMPRESSION(S) / ED DIAGNOSES  Final diagnoses:  Closed fracture of left hip, initial encounter (HCC)      NEW MEDICATIONS STARTED DURING THIS VISIT:  New Prescriptions   No medications on file     Note:  This document was prepared using Dragon voice recognition software and may include unintentional dictation errors.   Dionne Bucy, MD 06/18/20 2130

## 2020-06-18 NOTE — H&P (Signed)
History and Physical   ED MANDICH WUJ:[id number] DOB: 10-19-1950 DOA: 06/18/2020  PCP: Jerl Mina, MD  Patient coming from: Home  I have personally briefly reviewed patient's old medical records in [facility] EMR.  Chief Concern: Left hip pain  HPI: Charles Ray is a 70 y.o. male with medical history significant for depression, alcohol abuse, tobacco abuse, presents to the emergency department for chief concerns of left hip pain after a fall.  Patient reports that he was stepping onto the sidewalk when he tripped and fell.  Patient and spouse was leaving a seafood restaurant that they went to for dinner.  He denies head trauma and loss of consciousness.  He denies chest pain, shortness of breath, dizziness, nausea, vomiting, abdominal pain.  He states that this is never happened before.  He states that the pain is 10 out of 10 and is sharp and persistent since the fall.  He is not able to bear weight.  Social history: Patient is retired and formerly worked Hydrographic surveyor.  He lives at home with his spouse.  He drinks alcohol every day, half a pint a day.  He denies history of DTs, seizures.  He smokes 1 pack of cigarettes per day.  He denies recreational drug use.  Spouse at bedside states that patient has not had time to drinks alcohol yet.  Vaccination: Patient is fully vaccinated for COVID-19 including booster.  ROS: Constitutional: no weight change, no fever ENT/Mouth: no sore throat, no rhinorrhea Eyes: no eye pain, no vision changes Cardiovascular: no chest pain, no dyspnea,  no edema, no palpitations Respiratory: no cough, no sputum, no wheezing Gastrointestinal: no nausea, no vomiting, no diarrhea, no constipation Genitourinary: no urinary incontinence, no dysuria, no hematuria Musculoskeletal: no arthralgias, no myalgias, + left hip pain Skin: no skin lesions, no pruritus, Neuro: + weakness, no loss of consciousness, no syncope Psych: no anxiety, no depression,  no decrease appetite Heme/Lymph: no bruising, no bleeding  ED Course: Discussed with ED provider, patient requiring hospitalization due to left proximal femur secondary to mechanical fall.  Vitals in the ED was remarkable for temperature of 97.7, respiration rate of 18, heart rate of 85, blood pressure 105/95, satting 100% on room air.  Assessment/Plan  Active Problems:   Tobacco use   Femur fracture, left (HCC)   Alcohol abuse   Acute displaced transcervical fracture of the proximal left femur-secondary to mechanical fall -Orthopedic surgery, Dr. Allena Katz has been consulted -N.p.o. at midnight -Pain control: Norco 5 mg 1 to 2 tablets, every 6 hours as needed for moderate pain, morphine 0.5 mg IV every 2 hours for severe pain  Alcohol use -Last drink was 06/17/2020 -Patient drinks half a pint a day -CIWA protocol  Tobacco abuse-1 pack/day -Nicotine patch ordered  Depression-resumed home amitriptyline 200 mg nightly  Chart reviewed.   DVT prophylaxis: SCDs Code Status: Full code Diet: Heart healthy now, n.p.o. after midnight Family Communication: Updated spouse at bedside Disposition Plan: Pending clinical course Consults called: Orthopedic Admission status: Inpatient MedSurg  No past medical history on file.  No past surgical history on file.  Social History:  reports that he has been smoking cigarettes. He has a 50.00 pack-year smoking history. He has never used smokeless tobacco. He reports current alcohol use of about 3.0 standard drinks of alcohol per week. He reports that he does not use drugs.  No Known Allergies No family history on file. Family history: Family history reviewed and not pertinent  Prior to  Admission medications   Medication Sig Start Date End Date Taking? Authorizing Provider  amitriptyline (ELAVIL) 150 MG tablet Take 2 tablets by mouth at bedtime. 09/08/17  Yes [provider]  Multiple Vitamin (MULTIVITAMIN) capsule Take 1 capsule by  mouth daily.   Yes [provider]  albuterol (VENTOLIN HFA) 108 (90 Base) MCG/ACT inhaler TWO PUFFS EVERY 4 HOURS AS NEEDED FOR WHEEZE AND COUGH. 10/31/17   [provider]  fluticasone (FLONASE) 50 MCG/ACT nasal spray Place 1 spray into both nostrils daily. 09/06/17 09/06/18  [provider]   Physical Exam: Vitals:   06/18/20 1941 06/18/20 1944 06/18/20 2057  BP: 104/73 104/73 (!) 105/95  Pulse: 82 93 85  Resp: 20 20 18   Temp: 97.7 F (36.5 C) 97.7 F (36.5 C)   TempSrc: Oral Oral   SpO2: 94% 100% 100%  Weight:  76.2 kg   Height:  6\' 5"  (1.956 m)    Constitutional: appears age-appropriate, frail, NAD, calm, comfortable Eyes: PERRL, lids and conjunctivae normal ENMT: Mucous membranes are moist. Posterior pharynx clear of any exudate or lesions. Age-appropriate dentition. Hearing appropriate Neck: normal, supple, no masses, no thyromegaly Respiratory: clear to auscultation bilaterally, no wheezing, no crackles. Normal respiratory effort. No accessory muscle use.  Cardiovascular: Regular rate and rhythm, no murmurs / rubs / gallops. No extremity edema. 2+ pedal pulses. No carotid bruits.  Abdomen: no tenderness, no masses palpated, no hepatosplenomegaly. Bowel sounds positive.  Musculoskeletal: no clubbing / cyanosis. No joint deformity upper and lower extremities. Good ROM, no contractures, no atrophy. Normal muscle tone.  Skin: no rashes, lesions, ulcers. No induration Neurologic: Sensation intact. Strength 5/5 in all 4.  Psychiatric: Normal judgment and insight. Alert and oriented x 3. Normal mood.   EKG: independently reviewed, showing sinus rhythm with rate 80, QTc 485  Chest x-ray on Admission: I personally reviewed and I agree with radiologist reading as below.  DG Chest 1 View  Result Date: 06/18/2020 CLINICAL DATA:  Pain status post fall. EXAM: CHEST  1 VIEW COMPARISON:  March 13, 2018 FINDINGS: The heart size and mediastinal contours are within  normal limits. Both lungs are clear. The visualized skeletal structures are unremarkable. Aortic calcifications are noted. Emphysematous changes are noted. There are old healed left-sided rib fractures. IMPRESSION: No active disease. Electronically Signed   By: 08/18/2020 M.D.   On: 06/18/2020 20:34   DG Hip Unilat W or Wo Pelvis 2-3 Views Left  Result Date: 06/18/2020 CLINICAL DATA:  Pain EXAM: DG HIP (WITH OR WITHOUT PELVIS) 2-3V LEFT COMPARISON:  None. FINDINGS: There is an acute displaced transcervical fracture of the proximal left femur. There is no dislocation. There is osteopenia. There are no significant degenerative changes. IMPRESSION: Acute displaced transcervical fracture of the proximal left femur. Electronically Signed   By: 08/18/2020 M.D.   On: 06/18/2020 20:28   Labs on Admission: I have personally reviewed following labs  CBC: Recent Labs  Lab 06/18/20 2055  WBC 12.2*  NEUTROABS 9.6*  HGB 12.6*  HCT 37.9*  MCV 93.8  PLT 375   Basic Metabolic Panel: Recent Labs  Lab 06/18/20 2055  NA 138  K 4.6  CL 104  CO2 26  GLUCOSE 93  BUN 40*  CREATININE 1.02  CALCIUM 8.7*   GFR: Estimated Creatinine Clearance: 70.6 mL/min (by C-G formula based on SCr of 1.02 mg/dL).  Urine analysis:    Component Value Date/Time   COLORURINE YELLOW (A) 09/20/2018 1748   APPEARANCEUR CLEAR (  A) 09/20/2018 1748   LABSPEC 1.020 09/20/2018 1748   PHURINE 5.0 09/20/2018 1748   GLUCOSEU NEGATIVE 09/20/2018 1748   HGBUR NEGATIVE 09/20/2018 1748   BILIRUBINUR NEGATIVE 09/20/2018 1748   KETONESUR 5 (A) 09/20/2018 1748   PROTEINUR NEGATIVE 09/20/2018 1748   NITRITE NEGATIVE 09/20/2018 1748   LEUKOCYTESUR NEGATIVE 09/20/2018 1748   Jarrett Albor N Jarad Barth D.O. [facility]  If 7PM-7AM, please contact overnight-coverage provider If 7AM-7PM, please contact day coverage provider [email or web address]  06/18/2020, 9:54 PM

## 2020-06-19 ENCOUNTER — Inpatient Hospital Stay: Payer: Medicare HMO

## 2020-06-19 ENCOUNTER — Encounter
Admission: EM | Disposition: A | Discharge status: Skilled Nursing Facility | Payer: Self-pay | Source: Home / Self Care | Attending: Hospitalist

## 2020-06-19 ENCOUNTER — Inpatient Hospital Stay: Payer: Medicare HMO | Admitting: Registered Nurse

## 2020-06-19 ENCOUNTER — Encounter: Payer: Self-pay | Admitting: Internal Medicine

## 2020-06-19 DIAGNOSIS — S72002A Fracture of unspecified part of neck of left femur, initial encounter for closed fracture: Secondary | ICD-10-CM

## 2020-06-19 DIAGNOSIS — W19XXXA Unspecified fall, initial encounter: Secondary | ICD-10-CM

## 2020-06-19 DIAGNOSIS — Z720 Tobacco use: Secondary | ICD-10-CM | POA: Diagnosis not present

## 2020-06-19 DIAGNOSIS — F1010 Alcohol abuse, uncomplicated: Secondary | ICD-10-CM | POA: Diagnosis not present

## 2020-06-19 HISTORY — PX: TOTAL HIP ARTHROPLASTY: SHX124

## 2020-06-19 LAB — CBC
HCT: 37.7 % — ABNORMAL LOW (ref 39.0–52.0)
Hemoglobin: 12.4 g/dL — ABNORMAL LOW (ref 13.0–17.0)
MCH: 31 pg (ref 26.0–34.0)
MCHC: 32.9 g/dL (ref 30.0–36.0)
MCV: 94.3 fL (ref 80.0–100.0)
Platelets: 311 10*3/uL (ref 150–400)
RBC: 4 MIL/uL — ABNORMAL LOW (ref 4.22–5.81)
RDW: 14 % (ref 11.5–15.5)
WBC: 14.6 10*3/uL — ABNORMAL HIGH (ref 4.0–10.5)
nRBC: 0 % (ref 0.0–0.2)

## 2020-06-19 LAB — CREATININE, SERUM
Creatinine, Ser: 0.84 mg/dL (ref 0.61–1.24)
GFR, Estimated: >60 mL/min (ref >60)

## 2020-06-19 LAB — ABO/RH: ABO/RH(D): O POS

## 2020-06-19 LAB — SURGICAL PCR SCREEN
MRSA, PCR: NEGATIVE
Staphylococcus aureus: NEGATIVE

## 2020-06-19 LAB — TYPE AND SCREEN
ABO/RH(D): O POS
Antibody Screen: NEGATIVE

## 2020-06-19 SURGERY — ARTHROPLASTY, HIP, TOTAL,POSTERIOR APPROACH
Anesthesia: General | Site: Hip | Laterality: Left

## 2020-06-19 SURGERY — HEMIARTHROPLASTY, HIP, DIRECT ANTERIOR APPROACH, FOR FRACTURE
Anesthesia: Choice | Site: Hip | Laterality: Left

## 2020-06-19 MED ORDER — EPHEDRINE SULFATE 50 MG/ML IJ SOLN
INTRAMUSCULAR | Status: DC | PRN
  Administered 2020-06-19: 5 mg via INTRAVENOUS
  Administered 2020-06-19: 10 mg via INTRAVENOUS
  Administered 2020-06-19: 5 mg via INTRAVENOUS

## 2020-06-19 MED ORDER — BUPIVACAINE HCL (PF) 0.5 % IJ SOLN
INTRAMUSCULAR | Status: DC | PRN
  Administered 2020-06-19: 3 mL

## 2020-06-19 MED ORDER — MIDAZOLAM HCL 5 MG/5ML IJ SOLN
INTRAMUSCULAR | Status: DC | PRN
  Administered 2020-06-19: 2 mg via INTRAVENOUS

## 2020-06-19 MED ORDER — ONDANSETRON HCL 4 MG PO TABS: 4.0000 mg | ORAL_TABLET | Freq: Four times a day (QID) | ORAL | Status: DC | PRN

## 2020-06-19 MED ORDER — TRANEXAMIC ACID-NACL 1000-0.7 MG/100ML-% IV SOLN: 1000.0000 mg | Freq: Once | INTRAVENOUS | Status: AC

## 2020-06-19 MED ORDER — MAGNESIUM HYDROXIDE 400 MG/5ML PO SUSP
30.0000 mL | Freq: Every day | ORAL | Status: DC | PRN
Start: 2020-06-19 — End: 2020-06-29
  Administered 2020-06-22: 30 mL via ORAL
  Filled 2020-06-19: qty 30

## 2020-06-19 MED ORDER — ACETAMINOPHEN 10 MG/ML IV SOLN
INTRAVENOUS | Status: DC | PRN
  Administered 2020-06-19: 1000 mg via INTRAVENOUS

## 2020-06-19 MED ORDER — PROPOFOL 10 MG/ML IV BOLUS
INTRAVENOUS | Status: DC | PRN
  Administered 2020-06-19 (×2): 20 mg via INTRAVENOUS

## 2020-06-19 MED ORDER — PANTOPRAZOLE SODIUM 40 MG PO TBEC
40.0000 mg | DELAYED_RELEASE_TABLET | Freq: Every day | ORAL | Status: DC
  Administered 2020-06-19 – 2020-06-29 (×11): 40 mg via ORAL
  Filled 2020-06-19 (×11): qty 1

## 2020-06-19 MED ORDER — LACTATED RINGERS IV SOLN: INTRAVENOUS | Status: DC | PRN

## 2020-06-19 MED ORDER — DOCUSATE SODIUM 100 MG PO CAPS
100.0000 mg | ORAL_CAPSULE | Freq: Two times a day (BID) | ORAL | Status: DC
  Administered 2020-06-19 – 2020-06-29 (×18): 100 mg via ORAL
  Filled 2020-06-19 (×19): qty 1

## 2020-06-19 MED ORDER — BUPIVACAINE-EPINEPHRINE 0.25% -1:200000 IJ SOLN
INTRAMUSCULAR | Status: DC | PRN
  Administered 2020-06-19: 30 mL

## 2020-06-19 MED ORDER — MIDAZOLAM HCL 2 MG/2ML IJ SOLN
INTRAMUSCULAR | Status: AC
  Filled 2020-06-19: qty 2

## 2020-06-19 MED ORDER — ACETAMINOPHEN 10 MG/ML IV SOLN: 1000.0000 mg | Freq: Once | INTRAVENOUS | Status: DC | PRN

## 2020-06-19 MED ORDER — OXYCODONE HCL 5 MG/5ML PO SOLN: 5.0000 mg | Freq: Once | ORAL | Status: DC | PRN

## 2020-06-19 MED ORDER — PROPOFOL 500 MG/50ML IV EMUL
INTRAVENOUS | Status: DC | PRN
  Administered 2020-06-19: 100 ug/kg/min via INTRAVENOUS

## 2020-06-19 MED ORDER — SODIUM CHLORIDE 0.9 % IV SOLN: INTRAVENOUS | Status: DC

## 2020-06-19 MED ORDER — ACETAMINOPHEN 10 MG/ML IV SOLN
INTRAVENOUS | Status: AC
  Filled 2020-06-19: qty 100

## 2020-06-19 MED ORDER — MAGNESIUM CITRATE PO SOLN
1.0000 | Freq: Once | ORAL | Status: DC | PRN
  Filled 2020-06-19: qty 296

## 2020-06-19 MED ORDER — OXYCODONE HCL 5 MG PO TABS: 5.0000 mg | ORAL_TABLET | Freq: Once | ORAL | Status: DC | PRN

## 2020-06-19 MED ORDER — ONDANSETRON HCL 4 MG/2ML IJ SOLN: 4.0000 mg | Freq: Four times a day (QID) | INTRAMUSCULAR | Status: DC | PRN

## 2020-06-19 MED ORDER — PROPOFOL 500 MG/50ML IV EMUL
INTRAVENOUS | Status: AC
  Filled 2020-06-19: qty 50

## 2020-06-19 MED ORDER — PHENYLEPHRINE HCL (PRESSORS) 10 MG/ML IV SOLN
INTRAVENOUS | Status: DC | PRN
  Administered 2020-06-19: 100 ug via INTRAVENOUS
  Administered 2020-06-19: 200 ug via INTRAVENOUS
  Administered 2020-06-19: 100 ug via INTRAVENOUS
  Administered 2020-06-19: 200 ug via INTRAVENOUS

## 2020-06-19 MED ORDER — SODIUM CHLORIDE 0.9 % IV SOLN
INTRAVENOUS | Status: DC | PRN
  Administered 2020-06-19: 60 mL

## 2020-06-19 MED ORDER — EPHEDRINE 5 MG/ML INJ
INTRAVENOUS | Status: AC
  Filled 2020-06-19: qty 10

## 2020-06-19 MED ORDER — FENTANYL CITRATE (PF) 100 MCG/2ML IJ SOLN
INTRAMUSCULAR | Status: DC | PRN
  Administered 2020-06-19: 25 ug via INTRAVENOUS
  Administered 2020-06-19: 50 ug via INTRAVENOUS

## 2020-06-19 MED ORDER — BUPIVACAINE HCL (PF) 0.5 % IJ SOLN
INTRAMUSCULAR | Status: AC
  Filled 2020-06-19: qty 10

## 2020-06-19 MED ORDER — ENOXAPARIN SODIUM 40 MG/0.4ML ~~LOC~~ SOLN
40.0000 mg | SUBCUTANEOUS | Status: DC
  Administered 2020-06-20 – 2020-06-29 (×10): 40 mg via SUBCUTANEOUS
  Filled 2020-06-19 (×10): qty 0.4

## 2020-06-19 MED ORDER — NEOMYCIN-POLYMYXIN B GU 40-200000 IR SOLN
Status: DC | PRN
  Administered 2020-06-19: 4 mL

## 2020-06-19 MED ORDER — BISACODYL 10 MG RE SUPP: 10.0000 mg | Freq: Every day | RECTAL | Status: DC | PRN

## 2020-06-19 MED ORDER — MENTHOL 3 MG MT LOZG
1.0000 | LOZENGE | OROMUCOSAL | Status: DC | PRN
  Filled 2020-06-19: qty 9

## 2020-06-19 MED ORDER — FENTANYL CITRATE (PF) 100 MCG/2ML IJ SOLN
INTRAMUSCULAR | Status: AC
  Filled 2020-06-19: qty 2

## 2020-06-19 MED ORDER — DIPHENHYDRAMINE HCL 12.5 MG/5ML PO ELIX
12.5000 mg | ORAL_SOLUTION | ORAL | Status: DC | PRN
  Administered 2020-06-27: 25 mg via ORAL
  Filled 2020-06-19: qty 10

## 2020-06-19 MED ORDER — CEFAZOLIN SODIUM-DEXTROSE 2-4 GM/100ML-% IV SOLN
2.0000 g | Freq: Four times a day (QID) | INTRAVENOUS | Status: AC
  Administered 2020-06-19 – 2020-06-20 (×2): 2 g via INTRAVENOUS
  Filled 2020-06-19 (×2): qty 100

## 2020-06-19 MED ORDER — TRANEXAMIC ACID-NACL 1000-0.7 MG/100ML-% IV SOLN
INTRAVENOUS | Status: AC
  Administered 2020-06-19: 1000 mg via INTRAVENOUS
  Filled 2020-06-19: qty 100

## 2020-06-19 MED ORDER — PHENOL 1.4 % MT LIQD
1.0000 | OROMUCOSAL | Status: DC | PRN
  Filled 2020-06-19: qty 177

## 2020-06-19 MED ORDER — ALUM & MAG HYDROXIDE-SIMETH 200-200-20 MG/5ML PO SUSP
30.0000 mL | ORAL | Status: DC | PRN
Start: 2020-06-19 — End: 2020-06-29

## 2020-06-19 MED ORDER — METOCLOPRAMIDE HCL 5 MG/ML IJ SOLN: 5.0000 mg | Freq: Three times a day (TID) | INTRAMUSCULAR | Status: DC | PRN

## 2020-06-19 MED ORDER — FENTANYL CITRATE (PF) 100 MCG/2ML IJ SOLN: 25.0000 ug | INTRAMUSCULAR | Status: DC | PRN

## 2020-06-19 MED ORDER — CEFAZOLIN SODIUM-DEXTROSE 2-4 GM/100ML-% IV SOLN
INTRAVENOUS | Status: AC
  Filled 2020-06-19: qty 100

## 2020-06-19 MED ORDER — MUPIROCIN 2 % EX OINT
1.0000 "application " | TOPICAL_OINTMENT | Freq: Two times a day (BID) | CUTANEOUS | Status: DC
  Administered 2020-06-20 – 2020-06-21 (×4): 1 via NASAL
  Filled 2020-06-19 (×2): qty 22

## 2020-06-19 MED ORDER — METOCLOPRAMIDE HCL 10 MG PO TABS: 5.0000 mg | ORAL_TABLET | Freq: Three times a day (TID) | ORAL | Status: DC | PRN

## 2020-06-19 MED ORDER — ONDANSETRON HCL 4 MG/2ML IJ SOLN: 4.0000 mg | Freq: Once | INTRAMUSCULAR | Status: DC | PRN

## 2020-06-19 MED ORDER — SODIUM CHLORIDE 0.9 % IV SOLN
INTRAVENOUS | Status: DC | PRN
  Administered 2020-06-19: 50 ug/min via INTRAVENOUS

## 2020-06-19 SURGICAL SUPPLY — 70 items
APL PRP STRL LF DISP 70% ISPRP (MISCELLANEOUS) ×1
BLADE SAGITTAL AGGR TOOTH XLG (BLADE) ×2 IMPLANT
BNDG COHESIVE 6X5 TAN STRL LF (GAUZE/BANDAGES/DRESSINGS) ×6 IMPLANT
BOWL CEMENT MIXING ADV NOZZLE (MISCELLANEOUS) ×1 IMPLANT
CANISTER SUCT 1200ML W/VALVE (MISCELLANEOUS) ×2 IMPLANT
CANISTER WOUND CARE 500ML ATS (WOUND CARE) ×2 IMPLANT
CEMENT BONE 40GM (Cement) ×2 IMPLANT
CHLORAPREP W/TINT 26 (MISCELLANEOUS) ×2 IMPLANT
COVER BACK TABLE REUSABLE LG (DRAPES) ×2 IMPLANT
COVER WAND RF STERILE (DRAPES) ×2 IMPLANT
CUP ACETAB VERSA DBL 28X58 DMI (Orthopedic Implant) ×1 IMPLANT
DRAPE 3/4 80X56 (DRAPES) ×6 IMPLANT
DRAPE C-ARM XRAY 36X54 (DRAPES) ×2 IMPLANT
DRAPE INCISE IOBAN 66X60 STRL (DRAPES) IMPLANT
DRAPE POUCH INSTRU U-SHP 10X18 (DRAPES) ×2 IMPLANT
DRESSING SURGICEL FIBRLLR 1X2 (HEMOSTASIS) ×2 IMPLANT
DRSG MEPILEX SACRM 8.7X9.8 (GAUZE/BANDAGES/DRESSINGS) ×2 IMPLANT
DRSG OPSITE POSTOP 4X8 (GAUZE/BANDAGES/DRESSINGS) ×4 IMPLANT
DRSG PREVENA PLUS CUSTOM (GAUZE/BANDAGES/DRESSINGS) ×1
DRSG SURGICEL FIBRILLAR 1X2 (HEMOSTASIS) ×4
ELECT BLADE 6.5 EXT (BLADE) ×2 IMPLANT
ELECT REM PT RETURN 9FT ADLT (ELECTROSURGICAL) ×2
ELECTRODE REM PT RTRN 9FT ADLT (ELECTROSURGICAL) ×1 IMPLANT
GLOVE SURG SYN 9.0  PF PI (GLOVE) ×4
GLOVE SURG SYN 9.0 PF PI (GLOVE) ×2 IMPLANT
GLOVE SURG UNDER POLY LF SZ9 (GLOVE) ×2 IMPLANT
GOWN SRG 2XL LVL 4 RGLN SLV (GOWNS) ×1 IMPLANT
GOWN STRL NON-REIN 2XL LVL4 (GOWNS) ×2
GOWN STRL REUS W/ TWL LRG LVL3 (GOWN DISPOSABLE) ×1 IMPLANT
GOWN STRL REUS W/TWL LRG LVL3 (GOWN DISPOSABLE) ×2
HEMOVAC 400CC 10FR (MISCELLANEOUS) IMPLANT
HIP FEM HD S 28 (Head) ×1 IMPLANT
HOLDER FOLEY CATH W/STRAP (MISCELLANEOUS) ×2 IMPLANT
HOOD PEEL AWAY FLYTE STAYCOOL (MISCELLANEOUS) ×2 IMPLANT
IRRIGATION SURGIPHOR STRL (IV SOLUTION) IMPLANT
KIT PREVENA INCISION MGT 13 (CANNISTER) ×2 IMPLANT
KNIFE SCULPS 14X20 (INSTRUMENTS) ×1 IMPLANT
MANIFOLD NEPTUNE II (INSTRUMENTS) ×2 IMPLANT
MAT ABSORB  FLUID 56X50 GRAY (MISCELLANEOUS) ×2
MAT ABSORB FLUID 56X50 GRAY (MISCELLANEOUS) ×1 IMPLANT
NDL SAFETY ECLIPSE 18X1.5 (NEEDLE) ×1 IMPLANT
NDL SPNL 20GX3.5 QUINCKE YW (NEEDLE) ×2 IMPLANT
NEEDLE HYPO 18GX1.5 SHARP (NEEDLE) ×2
NEEDLE SPNL 20GX3.5 QUINCKE YW (NEEDLE) ×4 IMPLANT
NOZZLE FLEX THIN (MISCELLANEOUS) ×1 IMPLANT
NS IRRIG 1000ML POUR BTL (IV SOLUTION) ×2 IMPLANT
PACK HIP COMPR (MISCELLANEOUS) ×2 IMPLANT
PRESSURIZER FEM CANAL M (MISCELLANEOUS) ×1 IMPLANT
RESTRICTOR CEMENT SZ 5 C-STEM (Cement) ×1 IMPLANT
SCALPEL PROTECTED #10 DISP (BLADE) ×4 IMPLANT
SHELL ACETABULAR SZ0 58MM (Shell) ×1 IMPLANT
SOL PREP PVP 2OZ (MISCELLANEOUS) ×2
SOLUTION PREP PVP 2OZ (MISCELLANEOUS) ×1 IMPLANT
SPONGE DRAIN TRACH 4X4 STRL 2S (GAUZE/BANDAGES/DRESSINGS) ×2 IMPLANT
STAPLER SKIN PROX 35W (STAPLE) ×2 IMPLANT
STEM FEM HIP SZ4 STD (Stem) ×1 IMPLANT
STRAP SAFETY 5IN WIDE (MISCELLANEOUS) ×2 IMPLANT
SUT DVC 2 QUILL PDO  T11 36X36 (SUTURE) ×2
SUT DVC 2 QUILL PDO T11 36X36 (SUTURE) ×1 IMPLANT
SUT SILK 0 (SUTURE) ×2
SUT SILK 0 30XBRD TIE 6 (SUTURE) ×1 IMPLANT
SUT V-LOC 90 ABS DVC 3-0 CL (SUTURE) ×2 IMPLANT
SUT VIC AB 1 CT1 36 (SUTURE) ×2 IMPLANT
SYR 20ML LL LF (SYRINGE) ×2 IMPLANT
SYR 30ML LL (SYRINGE) ×2 IMPLANT
SYR 50ML LL SCALE MARK (SYRINGE) ×4 IMPLANT
SYR BULB IRRIG 60ML STRL (SYRINGE) ×2 IMPLANT
TAPE MICROFOAM 4IN (TAPE) ×2 IMPLANT
TOWEL OR 17X26 4PK STRL BLUE (TOWEL DISPOSABLE) ×2 IMPLANT
TRAY FOLEY MTR SLVR 16FR STAT (SET/KITS/TRAYS/PACK) ×2 IMPLANT

## 2020-06-19 NOTE — Anesthesia Postprocedure Evaluation (Signed)
Anesthesia Post Note  Patient: Charles Ray  Procedure(s) Performed: TOTAL HIP ARTHROPLASTY (Left Hip)  Patient location during evaluation: PACU Anesthesia Type: Combined General/Spinal Level of consciousness: oriented and awake and alert Pain management: pain level controlled Vital Signs Assessment: post-procedure vital signs reviewed and stable Respiratory status: spontaneous breathing, respiratory function stable and patient connected to nasal cannula oxygen Cardiovascular status: blood pressure returned to baseline and stable Postop Assessment: no headache, no backache and no apparent nausea or vomiting Anesthetic complications: no   No complications documented.   Last Vitals:  Vitals:   06/19/20 1915 06/19/20 1925  BP: 131/84 (!) 141/76  Pulse: 77 74  Resp: 19 19  Temp:  37.2 C  SpO2: 92% 92%    Last Pain:  Vitals:   06/19/20 1925  TempSrc:   PainSc: 0-No pain                 Corinda Gubler

## 2020-06-19 NOTE — Anesthesia Preprocedure Evaluation (Signed)
Anesthesia Evaluation  Patient identified by MRN, date of birth, ID band Patient awake    Reviewed: Allergy & Precautions, NPO status , Patient's Chart, lab work & pertinent test results  History of Anesthesia Complications Negative for: history of anesthetic complications  Airway Mallampati: III  TM Distance: >3 FB Neck ROM: Full    Dental  (+) Edentulous Lower, Edentulous Upper   Pulmonary neg sleep apnea, neg COPD, Current Smoker and Patient abstained from smoking.,  1 pack per day smoker   Pulmonary exam normal breath sounds clear to auscultation       Cardiovascular Exercise Tolerance: Good METS(-) hypertension(-) CAD and (-) Past MI negative cardio ROS  (-) dysrhythmias  Rhythm:Regular Rate:Normal - Systolic murmurs    Neuro/Psych negative neurological ROS  negative psych ROS   GI/Hepatic neg GERD  ,(+)     substance abuse  alcohol use, Hepatitis -, CHalf pint a day drinker.    Endo/Other  neg diabetes  Renal/GU negative Renal ROS     Musculoskeletal   Abdominal   Peds  Hematology   Anesthesia Other Findings History reviewed. No pertinent past medical history.  Patient does not see physician regularly  Reproductive/Obstetrics                             Anesthesia Physical Anesthesia Plan  ASA: II  Anesthesia Plan: General/Spinal   Post-op Pain Management:    Induction: Intravenous  PONV Risk Score and Plan: 3 and Ondansetron, Dexamethasone, Propofol infusion, TIVA, Midazolam and Treatment may vary due to age or medical condition  Airway Management Planned: Natural Airway  Additional Equipment: None  Intra-op Plan:   Post-operative Plan:   Informed Consent: I have reviewed the patients History and Physical, chart, labs and discussed the procedure including the risks, benefits and alternatives for the proposed anesthesia with the patient or authorized  representative who has indicated his/her understanding and acceptance.       Plan Discussed with: CRNA and Surgeon  Anesthesia Plan Comments: (Discussed R/B/A of neuraxial anesthesia technique with patient: - rare risks of spinal/epidural hematoma, nerve damage, infection - Risk of PDPH - Risk of nausea and vomiting - Risk of conversion to general anesthesia and its associated risks, including sore throat, damage to lips/teeth/oropharynx, and rare risks such as cardiac and respiratory events.  Patient counseled on benefits of smoking cessation, and increased perioperative risks associated with continued smoking. Patient voiced understanding.)        Anesthesia Quick Evaluation

## 2020-06-19 NOTE — Transfer of Care (Signed)
Immediate Anesthesia Transfer of Care Note  Patient: Charles Ray  Procedure(s) Performed: TOTAL HIP ARTHROPLASTY (Left Hip)  Patient Location: PACU  Anesthesia Type:Spinal  Level of Consciousness: awake, alert  and oriented  Airway & Oxygen Therapy: Patient Spontanous Breathing  Post-op Assessment: Report given to RN and Post -op Vital signs reviewed and stable  Post vital signs: Reviewed and stable  Last Vitals:  Vitals Value Taken Time  BP 124/71 06/19/20 1800  Temp    Pulse 81 06/19/20 1802  Resp 19 06/19/20 1802  SpO2 87 % 06/19/20 1802  Vitals shown include unvalidated device data.  Last Pain:  Vitals:   06/19/20 1510  TempSrc: Temporal  PainSc: 7          Complications: No complications documented.

## 2020-06-19 NOTE — Consult Note (Signed)
Reason for Consult: Displaced left hip fracture Referring Physician: Dr. Curt Jews is an 70 y.o. male.  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  He stumbled on a curb last night falling and injuring his left hip.  He denies prodromal symptoms or loss of consciousness.  He normally can get to the grocery store walks without a cane and likes to fish.  No past medical history on file.  No past surgical history on file.  No family history on file.  Social History:  reports that he has been smoking cigarettes. He has a 50.00 pack-year smoking history. He has never used smokeless tobacco. He reports current alcohol use of about 3.0 standard drinks of alcohol per week. He reports that he does not use drugs.  Allergies: No Known Allergies  Medications: I have reviewed the patient's current medications.  Results for orders placed or performed during the hospital encounter of 06/18/20 (from the past 48 hour(s))  Basic metabolic panel     Status: Abnormal   Collection Time: 06/18/20  8:Charles PM  Result Value Ref Range   Sodium 138 135 - 145 mmol/L   Potassium 4.6 3.5 - 5.1 mmol/L   Chloride 104 98 - 111 mmol/L   CO2 26 22 - 32 mmol/L   Glucose, Bld 93 70 - 99 mg/dL    Comment: Glucose reference range applies only to samples taken after fasting for at least 8 hours.   BUN 40 (H) 8 - 23 mg/dL   Creatinine, Ser 4.09 0.61 - 1.24 mg/dL   Calcium 8.7 (L) 8.9 - 10.3 mg/dL   GFR, Estimated >81 >19 mL/min    Comment: (NOTE) Calculated using the CKD-EPI Creatinine Equation (2021)    Anion gap 8 5 - 15    Comment: Performed at [facility], [address] [place], [place] [address]  CBC with Differential     Status: Abnormal   Collection Time: 06/18/20  8:Charles PM  Result Value Ref Range   WBC 12.2 (H) 4.0 - 10.5 K/uL   RBC 4.04 (L) 4.22 - 5.81 MIL/uL   Hemoglobin 12.6 (L) 13.0 - 17.0 g/dL   HCT 95.6 (L) 21.3 - 08.6 %   MCV 93.8 80.0  - 100.0 fL   MCH 31.2 26.0 - 34.0 pg   MCHC 33.2 30.0 - 36.0 g/dL   RDW 57.8 46.9 - 62.9 %   Platelets 375 150 - 400 K/uL   nRBC 0.0 0.0 - 0.2 %   Neutrophils Relative % 78 %   Neutro Abs 9.6 (H) 1.7 - 7.7 K/uL   Lymphocytes Relative 10 %   Lymphs Abs 1.2 0.7 - 4.0 K/uL   Monocytes Relative 8 %   Monocytes Absolute 1.0 0.1 - 1.0 K/uL   Eosinophils Relative 2 %   Eosinophils Absolute 0.2 0.0 - 0.5 K/uL   Basophils Relative 1 %   Basophils Absolute 0.1 0.0 - 0.1 K/uL   Immature Granulocytes 1 %   Abs Immature Granulocytes 0.09 (H) 0.00 - 0.07 K/uL    Comment: Performed at [facility], [address] [place], [place] [address]  Resp Panel by RT-PCR (Flu A&B, Covid) Nasopharyngeal Swab     Status: None   Collection Time: 06/18/20  8:Charles PM   Specimen: Nasopharyngeal Swab; Nasopharyngeal(NP) swabs in vial transport medium  Result Value Ref Range   SARS Coronavirus 2 by RT PCR NEGATIVE NEGATIVE    Comment: (NOTE) SARS-CoV-2  target nucleic acids are NOT DETECTED.  The SARS-CoV-2 RNA is generally detectable in upper respiratory specimens during the acute phase of infection. The lowest concentration of SARS-CoV-2 viral copies this assay can detect is 138 copies/mL. A negative result does not preclude SARS-Cov-2 infection and should not be used as the sole basis for treatment or other patient management decisions. A negative result may occur with  improper specimen collection/handling, submission of specimen other than nasopharyngeal swab, presence of viral mutation(s) within the areas targeted by this assay, and inadequate number of viral copies(<138 copies/mL). A negative result must be combined with clinical observations, patient history, and epidemiological information. The expected result is Negative.  Fact Sheet for Patients:  BloggerCourse.com  Fact Sheet for Healthcare Providers:  SeriousBroker.it  This test is no  t yet approved or cleared by the [place] FDA and  has been authorized for detection and/or diagnosis of SARS-CoV-2 by FDA under an Emergency Use Authorization (EUA). This EUA will remain  in effect (meaning this test can be used) for the duration of the COVID-19 declaration under Section 564(b)(1) of the Act, 21 U.S.C.section 360bbb-3(b)(1), unless the authorization is terminated  or revoked sooner.       Influenza A by PCR NEGATIVE NEGATIVE   Influenza B by PCR NEGATIVE NEGATIVE    Comment: (NOTE) The Xpert Xpress SARS-CoV-2/FLU/RSV plus assay is intended as an aid in the diagnosis of influenza from Nasopharyngeal swab specimens and should not be used as a sole basis for treatment. Nasal washings and aspirates are unacceptable for Xpert Xpress SARS-CoV-2/FLU/RSV testing.  Fact Sheet for Patients: BloggerCourse.com  Fact Sheet for Healthcare Providers: SeriousBroker.it  This test is not yet approved or cleared by the [place] FDA and has been authorized for detection and/or diagnosis of SARS-CoV-2 by FDA under an Emergency Use Authorization (EUA). This EUA will remain in effect (meaning this test can be used) for the duration of the COVID-19 declaration under Section 564(b)(1) of the Act, 21 U.S.C. section 360bbb-3(b)(1), unless the authorization is terminated or revoked.  Performed at [facility], [address] [place], [place] [address]   Protime-INR     Status: None   Collection Time: 06/18/20 10:56 PM  Result Value Ref Range   Prothrombin Time 12.4 11.4 - 15.2 seconds   INR 1.0 0.8 - 1.2    Comment: Performed at [facility], [address] [place], [place] [address]  Comprehensive metabolic panel     Status: Abnormal   Collection Time: 06/18/20 10:56 PM  Result Value Ref Range   Sodium 138 135 - 145 mmol/L   Potassium 4.2 3.5 - 5.1 mmol/L   Chloride 104 98 - 111 mmol/L   CO2 27 22  - 32 mmol/L   Glucose, Bld 103 (H) 70 - 99 mg/dL    Comment: Glucose reference range applies only to samples taken after fasting for at least 8 hours.   BUN 41 (H) 8 - 23 mg/dL   Creatinine, Ser 1.76 0.61 - 1.24 mg/dL   Calcium 8.8 (L) 8.9 - 10.3 mg/dL   Total Protein 6.7 6.5 - 8.1 g/dL   Albumin 3.4 (L) 3.5 - 5.0 g/dL   AST 17 15 - 41 U/L   ALT 16 0 - 44 U/L   Alkaline Phosphatase 106 38 - 126 U/L   Total Bilirubin 0.5 0.3 - 1.2 mg/dL   GFR, Estimated >16 >07 mL/min    Comment: (NOTE) Calculated using the CKD-EPI Creatinine Equation (2021)  Anion gap 7 5 - 15    Comment: Performed at [facility], [address] [place], [place] [address]  Magnesium     Status: None   Collection Time: 06/18/20 10:56 PM  Result Value Ref Range   Magnesium 2.1 1.7 - 2.4 mg/dL    Comment: Performed at [facility], [address] [place], [place] [address]  Phosphorus     Status: None   Collection Time: 06/18/20 10:56 PM  Result Value Ref Range   Phosphorus 2.9 2.5 - 4.6 mg/dL    Comment: Performed at [facility], [address] [place], [place] [address]  CBC     Status: Abnormal   Collection Time: 06/18/20 10:56 PM  Result Value Ref Range   WBC 14.2 (H) 4.0 - 10.5 K/uL   RBC 4.23 4.22 - 5.81 MIL/uL   Hemoglobin 13.2 13.0 - 17.0 g/dL   HCT 37.4 82.7 - 07.8 %   MCV 92.7 80.0 - 100.0 fL   MCH 31.2 26.0 - 34.0 pg   MCHC 33.7 30.0 - 36.0 g/dL   RDW 67.5 44.9 - 20.1 %   Platelets 366 150 - 400 K/uL   nRBC 0.0 0.0 - 0.2 %    Comment: Performed at [facility], [address], [place], [place] [address]  Surgical PCR screen     Status: None   Collection Time: 06/19/20  5:14 AM   Specimen: Nasal Mucosa; Nasal Swab  Result Value Ref Range   MRSA, PCR NEGATIVE NEGATIVE   Staphylococcus aureus NEGATIVE NEGATIVE    Comment: (NOTE) The Xpert SA Assay (FDA approved for NASAL specimens in patients 62 years of age and older), is one component of a  comprehensive surveillance program. It is not intended to diagnose infection nor to guide or monitor treatment. Performed at [facility], [address] [place], [place] [address]     DG Chest 1 View  Result Date: 06/18/2020 CLINICAL DATA:  Pain status post fall. EXAM: CHEST  1 VIEW COMPARISON:  March 13, 2018 FINDINGS: The heart size and mediastinal contours are within normal limits. Both lungs are clear. The visualized skeletal structures are unremarkable. Aortic calcifications are noted. Emphysematous changes are noted. There are Ray healed left-sided rib fractures. IMPRESSION: No active disease. Electronically Signed   By: Katherine Mantle M.D.   On: 06/18/2020 20:34   DG Hip Unilat W or Wo Pelvis 2-3 Views Left  Result Date: 06/18/2020 CLINICAL DATA:  Pain EXAM: DG HIP (WITH OR WITHOUT PELVIS) 2-3V LEFT COMPARISON:  None. FINDINGS: There is an acute displaced transcervical fracture of the proximal left femur. There is no dislocation. There is osteopenia. There are no significant degenerative changes. IMPRESSION: Acute displaced transcervical fracture of the proximal left femur. Electronically Signed   By: Katherine Mantle M.D.   On: 06/18/2020 20:28    Review of Systems Blood pressure 129/80, pulse 91, temperature 97.7 F (36.5 C), temperature source Oral, resp. rate 18, height 6\' 6"  (1.981 m), weight 66.4 kg, SpO2 92 %. Physical Exam Left leg is shortened and externally rotated with palpable pulses.  He is able to flex down the toes.  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  Site marked  06/19/2020, 7:43 AM

## 2020-06-19 NOTE — TOC Initial Note (Signed)
Transition of Care [facility]) - Initial/Assessment Note    Patient Details  Name: Charles Ray MRN: [id number] Date of Birth: April 19, 1950  [facility]) CM/SW Contact:    Caryn Section, RN Phone Number: 06/19/2020, 1:42 PM  Clinical Narrative:      TOC in to see patient and wife at bedside.  Patient awaiting surgery.  Patient and wife stated they are in agreement for patient to go to SNF rehab after discharge.  No discharge orders yet.  Patient and wife confirm there are no concerns with obtaining medications and wife can care for patient after SNF as they are both retired.  No further concerns or questions at this time.  TOC will follow through discharge.        Patient Goals and CMS Choice        Expected Discharge Plan and Services                                                Prior Living Arrangements/Services                       Activities of Daily Living Home Assistive Devices/Equipment: Cane (specify quad or straight) (homemade standard) ADL Screening (condition at time of admission) Patient's cognitive ability adequate to safely complete daily activities?: Yes Is the patient deaf or have difficulty hearing?: Yes Does the patient have difficulty seeing, even when wearing glasses/contacts?: No Does the patient have difficulty concentrating, remembering, or making decisions?: No Patient able to express need for assistance with ADLs?: Yes Does the patient have difficulty dressing or bathing?: No Independently performs ADLs?: Yes (appropriate for developmental age) Does the patient have difficulty walking or climbing stairs?: No Weakness of Legs: Left Weakness of Arms/Hands: None  Permission Sought/Granted                  Emotional Assessment              Admission diagnosis:  Fall [W19.XXXA] Femur fracture, left (HCC) [S72.92XA] Closed fracture of left hip, initial encounter [facility]) [S72.002A] Patient Active Problem List    Diagnosis Date Noted  . Closed fracture of left hip (HCC)   . Fall   . Femur fracture, left (HCC) 06/18/2020  . Alcohol abuse 06/18/2020  . Family history of colon cancer 07/05/2013  . Tests ordered 07/04/2012  . Hepatitis C 12/27/2011  . History of alcohol abuse 12/27/2011  . Tobacco use 12/27/2011  . Chronic pain 12/27/2011   PCP:  Jerl Mina, MD Pharmacy:   [phone] - [place], [place] - [address] [place] [address] Phone: [phone] Fax: [phone]     Social Determinants of Health (SDOH) Interventions    Readmission Risk Interventions No flowsheet data found.

## 2020-06-19 NOTE — Progress Notes (Signed)
OT Cancellation Note  Patient Details Name: Charles Ray MRN: [id number] DOB: [birth year]   Cancelled Treatment:    Reason Eval/Treat Not Completed: Medical issues which prohibited therapy. OT order received and chart reviewed. Per ortho consult, "Plan is for left total hip replacement later today". OT will re-attempt after surgical procedure and pt is able to participate.  Jackquline Denmark, MS, OTR/L , CBIS ascom [phone]  06/19/20, 8:58 AM   06/19/2020, 8:56 AM

## 2020-06-19 NOTE — Anesthesia Procedure Notes (Signed)
Spinal  Patient location during procedure: OR Start time: 06/19/2020 4:08 PM End time: 06/19/2020 4:11 PM Staffing Performed: resident/CRNA  Anesthesiologist: Arita Miss, MD Resident/CRNA: Tollie Eth, CRNA Preanesthetic Checklist Completed: patient identified, IV checked, site marked, risks and benefits discussed, surgical consent, monitors and equipment checked, pre-op evaluation and timeout performed Spinal Block Patient position: sitting Prep: Betadine Patient monitoring: heart rate, continuous pulse ox, blood pressure and cardiac monitor Approach: midline Location: L4-5 Injection technique: single-shot Needle Needle type: Whitacre and Introducer  Needle gauge: 24 G Needle length: 9 cm Additional Notes Negative paresthesia. Negative blood return. Positive free-flowing CSF. Expiration date of kit checked and confirmed. Patient tolerated procedure well, without complications.

## 2020-06-19 NOTE — Progress Notes (Signed)
PT Cancellation Note  Patient Details Name: Charles Ray MRN: [id number] DOB: 09-11-1950   Cancelled Treatment:    Reason Eval/Treat Not Completed: Patient not medically ready (PT orders placed prior to surgery. Will complete order, wait for new postoperative order. Per protocol, traumatic orthopedic surgeries are not typically seen on POD-0.)   Rosalene Wardrop C 06/19/2020, 8:58 AM

## 2020-06-19 NOTE — Progress Notes (Signed)
PROGRESS NOTE    Charles Ray  ZOX:[id number] DOB: 1950-05-17 DOA: 06/18/2020 PCP: Jerl Mina, MD   Brief Narrative: Taken from H&P. Charles Ray is a 70 y.o. male with medical history significant for depression, alcohol abuse, tobacco abuse, presents to the emergency department for chief concerns of left hip pain after having a mechanical fall.  Denies any presyncope or syncope.  Found to have displaced transcervical fracture of proximal left femur.  Orthopedic was consulted and he will be going for total hip replacement today.  Subjective: Patient continued to have left hip pain, stating today is not a good day for him.  Wife at bedside.  Patient drinks alcohol regularly, denies any prior withdrawal symptoms but per wife if he stopped drinking for couple of days he develops very strong urge and become little irritable.  Patient and wife will be interested going to rehab before returning home as she might not be able to provide the care he needed at home.  Assessment & Plan:   Active Problems:   Tobacco use   Femur fracture, left (HCC)   Alcohol abuse  Acute displaced transcervical fracture of proximal left femur.  Secondary to mechanical fall.  Going for total hip replacement later today. -Continue with pain management. -Follow-up post surgical recommendations. -PT/OT evaluation after the procedure  Alcohol abuse.  Patient drinks half a pint daily.  Last drink was on 06/17/2020. -Continue with CIWA protocol  Tobacco abuse.  Smokes 1 pack/day -Continue with nicotine patch  History of depression.  No acute concern. -Continue with home dose of amitriptyline at night.  Objective: Vitals:   06/18/20 2305 06/19/20 0318 06/19/20 0804 06/19/20 1142  BP: (!) 147/83 129/80 (!) 151/87 (!) 155/79  Pulse: 81 91 83 79  Resp: 20 18 16 16   Temp:  97.7 F (36.5 C) 97.8 F (36.6 C) 97.9 F (36.6 C)  TempSrc:  Oral Oral Oral  SpO2: 96% 92% 95% 99%  Weight: 66.4 kg     Height:  6\' 6"  (1.981 m)       Intake/Output Summary (Last 24 hours) at 06/19/2020 1329 Last data filed at 06/19/2020 1200 Gross per 24 hour  Intake --  Output 450 ml  Net -450 ml   Filed Weights   06/18/20 1944 06/18/20 2305  Weight: 76.2 kg 66.4 kg    Examination:  General exam: Not well kept elderly man, appears calm and comfortable  Respiratory system: Clear to auscultation. Respiratory effort normal. Cardiovascular system: S1 & S2 heard, RRR.  Gastrointestinal system: Soft, nontender, nondistended, bowel sounds positive. Central nervous system: Alert and oriented. No focal neurological deficits. Extremities: No edema, no cyanosis, pulses intact and symmetrical. Psychiatry: Judgement and insight appear normal. Mood & affect appropriate.    DVT prophylaxis: SCDs, can start Lovenox after surgery Code Status: Full Family Communication: Wife was updated at bedside Disposition Plan:  Status is: Inpatient  Remains inpatient appropriate because:Inpatient level of care appropriate due to severity of illness   Dispo: The patient is from: Home              Anticipated d/c is to: To be determined              Patient currently is not medically stable to d/c.   Difficult to place patient No              Level of care: Med-Surg  All the records are reviewed and case discussed with Care Management/Social Worker. Management plans discussed  with the patient, nursing and they are in agreement.  Consultants:   Orthopedic surgery  Procedures:  Antimicrobials:   Data Reviewed: I have personally reviewed following labs and imaging studies  CBC: Recent Labs  Lab 06/18/20 2055 06/18/20 2256  WBC 12.2* 14.2*  NEUTROABS 9.6*  --   HGB 12.6* 13.2  HCT 37.9* 39.2  MCV 93.8 92.7  PLT 375 366   Basic Metabolic Panel: Recent Labs  Lab 06/18/20 2055 06/18/20 2256  NA 138 138  K 4.6 4.2  CL 104 104  CO2 26 27  GLUCOSE 93 103*  BUN 40* 41*  CREATININE 1.02 1.03  CALCIUM 8.7* 8.8*   MG  --  2.1  PHOS  --  2.9   GFR: Estimated Creatinine Clearance: 60.9 mL/min (by C-G formula based on SCr of 1.03 mg/dL). Liver Function Tests: Recent Labs  Lab 06/18/20 2256  AST 17  ALT 16  ALKPHOS 106  BILITOT 0.5  PROT 6.7  ALBUMIN 3.4*   No results for input(s): LIPASE, AMYLASE in the last 168 hours. No results for input(s): AMMONIA in the last 168 hours. Coagulation Profile: Recent Labs  Lab 06/18/20 2256  INR 1.0   Cardiac Enzymes: No results for input(s): CKTOTAL, CKMB, CKMBINDEX, TROPONINI in the last 168 hours. BNP (last 3 results) No results for input(s): PROBNP in the last 8760 hours. HbA1C: No results for input(s): HGBA1C in the last 72 hours. CBG: No results for input(s): GLUCAP in the last 168 hours. Lipid Profile: No results for input(s): CHOL, HDL, LDLCALC, TRIG, CHOLHDL, LDLDIRECT in the last 72 hours. Thyroid Function Tests: No results for input(s): TSH, T4TOTAL, FREET4, T3FREE, THYROIDAB in the last 72 hours. Anemia Panel: No results for input(s): VITAMINB12, FOLATE, FERRITIN, TIBC, IRON, RETICCTPCT in the last 72 hours. Sepsis Labs: No results for input(s): PROCALCITON, LATICACIDVEN in the last 168 hours.  Recent Results (from the past 240 hour(s))  Resp Panel by RT-PCR (Flu A&B, Covid) Nasopharyngeal Swab     Status: None   Collection Time: 06/18/20  8:55 PM   Specimen: Nasopharyngeal Swab; Nasopharyngeal(NP) swabs in vial transport medium  Result Value Ref Range Status   SARS Coronavirus 2 by RT PCR NEGATIVE NEGATIVE Final    Comment: (NOTE) SARS-CoV-2 target nucleic acids are NOT DETECTED.  The SARS-CoV-2 RNA is generally detectable in upper respiratory specimens during the acute phase of infection. The lowest concentration of SARS-CoV-2 viral copies this assay can detect is 138 copies/mL. A negative result does not preclude SARS-Cov-2 infection and should not be used as the sole basis for treatment or other patient management  decisions. A negative result may occur with  improper specimen collection/handling, submission of specimen other than nasopharyngeal swab, presence of viral mutation(s) within the areas targeted by this assay, and inadequate number of viral copies(<138 copies/mL). A negative result must be combined with clinical observations, patient history, and epidemiological information. The expected result is Negative.  Fact Sheet for Patients:  BloggerCourse.com  Fact Sheet for Healthcare Providers:  SeriousBroker.it  This test is no t yet approved or cleared by the [place] FDA and  has been authorized for detection and/or diagnosis of SARS-CoV-2 by FDA under an Emergency Use Authorization (EUA). This EUA will remain  in effect (meaning this test can be used) for the duration of the COVID-19 declaration under Section 564(b)(1) of the Act, 21 U.S.C.section 360bbb-3(b)(1), unless the authorization is terminated  or revoked sooner.       Influenza A by PCR  NEGATIVE NEGATIVE Final   Influenza B by PCR NEGATIVE NEGATIVE Final    Comment: (NOTE) The Xpert Xpress SARS-CoV-2/FLU/RSV plus assay is intended as an aid in the diagnosis of influenza from Nasopharyngeal swab specimens and should not be used as a sole basis for treatment. Nasal washings and aspirates are unacceptable for Xpert Xpress SARS-CoV-2/FLU/RSV testing.  Fact Sheet for Patients: BloggerCourse.com  Fact Sheet for Healthcare Providers: SeriousBroker.it  This test is not yet approved or cleared by the [place] FDA and has been authorized for detection and/or diagnosis of SARS-CoV-2 by FDA under an Emergency Use Authorization (EUA). This EUA will remain in effect (meaning this test can be used) for the duration of the COVID-19 declaration under Section 564(b)(1) of the Act, 21 U.S.C. section 360bbb-3(b)(1), unless the  authorization is terminated or revoked.  Performed at [facility], [address] [place], [place] [address]   Surgical PCR screen     Status: None   Collection Time: 06/19/20  5:14 AM   Specimen: Nasal Mucosa; Nasal Swab  Result Value Ref Range Status   MRSA, PCR NEGATIVE NEGATIVE Final   Staphylococcus aureus NEGATIVE NEGATIVE Final    Comment: (NOTE) The Xpert SA Assay (FDA approved for NASAL specimens in patients 66 years of age and older), is one component of a comprehensive surveillance program. It is not intended to diagnose infection nor to guide or monitor treatment. Performed at [facility], [address] [place], [place] [address]      Radiology Studies: DG Chest 1 View  Result Date: 06/18/2020 CLINICAL DATA:  Pain status post fall. EXAM: CHEST  1 VIEW COMPARISON:  March 13, 2018 FINDINGS: The heart size and mediastinal contours are within normal limits. Both lungs are clear. The visualized skeletal structures are unremarkable. Aortic calcifications are noted. Emphysematous changes are noted. There are old healed left-sided rib fractures. IMPRESSION: No active disease. Electronically Signed   By: Katherine Mantle M.D.   On: 06/18/2020 20:34   DG Hip Unilat W or Wo Pelvis 2-3 Views Left  Result Date: 06/18/2020 CLINICAL DATA:  Pain EXAM: DG HIP (WITH OR WITHOUT PELVIS) 2-3V LEFT COMPARISON:  None. FINDINGS: There is an acute displaced transcervical fracture of the proximal left femur. There is no dislocation. There is osteopenia. There are no significant degenerative changes. IMPRESSION: Acute displaced transcervical fracture of the proximal left femur. Electronically Signed   By: Katherine Mantle M.D.   On: 06/18/2020 20:28    Scheduled Meds: . amitriptyline  300 mg Oral QHS  . folic acid  1 mg Oral Daily  . multivitamin with minerals  1 tablet Oral Daily  . mupirocin ointment  1 application Nasal BID  . thiamine  100 mg Oral Daily   Or   . thiamine  100 mg Intravenous Daily   Continuous Infusions: .  ceFAZolin (ANCEF) IV       LOS: 1 day   Time spent: 35 minutes. More than 50% of the time was spent in counseling/coordination of care  Arnetha Courser, MD [facility]  If 7PM-7AM, please contact night-coverage [email or web address]  06/19/2020, 1:29 PM   This record has been created using Conservation officer, historic buildings. Errors have been sought and corrected,but may not always be located. Such creation errors do not reflect on the standard of care.

## 2020-06-19 NOTE — Op Note (Signed)
06/19/2020  6:05 PM  PATIENT:  Charles Ray  70 y.o. male  PRE-OPERATIVE DIAGNOSIS:  Left Hip Fracture and osteoarthritis  POST-OPERATIVE DIAGNOSIS:  Left Hip Fracture and osteoarthritis  PROCEDURE:  Procedure(s): TOTAL HIP ARTHROPLASTY (Left)  SURGEON: Leitha Schuller, MD  ASSISTANTS: None  ANESTHESIA:   spinal  EBL:  Total I/O In: 900 [I.V.:700; IV Piggyback:200] Out: 500 [Urine:400; Blood:100]  BLOOD ADMINISTERED:none  DRAINS: Incisional wound VAC   LOCAL MEDICATIONS USED:  MARCAINE    and OTHER Exparel  SPECIMEN:  Source of Specimen:  Left femoral head and neck  DISPOSITION OF SPECIMEN:  PATHOLOGY  COUNTS:  YES  TOURNIQUET:  * No tourniquets in log *  IMPLANTS: Medacta AMIS cemented 4 standard stem with 58 millimeter Mpact TM cup and liner with metal S 28 mm head and size 5 cement restrictor from [employer]  DICTATION: .Dragon Dictation   The patient was brought to the operating room and after spinal anesthesia was obtained patient was placed on the operative table with the ipsilateral foot into the Medacta attachment, contralateral leg on a well-padded table. C-arm was brought in and preop template x-ray taken. After prepping and draping in usual sterile fashion appropriate patient identification and timeout procedures were completed. Anterior approach to the hip was obtained and centered over the greater trochanter and TFL muscle. The subcutaneous tissue was incised hemostasis being achieved by electrocautery. TFL fascia was incised and the muscle retracted laterally deep retractor placed. The lateral femoral circumflex vessels were identified and ligated. The anterior capsule was exposed and a capsulotomy performed.  There is extensive fracture hematoma which was evacuated fracture site exposed.  The neck was identified and a femoral neck cut carried out with a saw. The head was removed without difficulty and showed sclerotic femoral head and acetabulum. Reaming was carried  out to 58 mm and a 58 mm cup trial gave appropriate tightness to the acetabular component a 58 DM cup was impacted into position. The leg was then externally rotated and ischiofemoral and pubofemoral releases carried out. The femur was sequentially broached to a size 5, size 5 standard with S head trials were placed and the final components chosen. The 4 cemented stem was inserted after preparing the canal by irrigation was placed in the cement plug and then drying the canal, and after the cement had set, this was completed with a S metal 28 mm head and 58 mm liner. The hip was reduced and was stable the wound was thoroughly irrigated with fibrillar placed along the posterior capsule and medial neck. The deep fascia ws closed using a heavy Quill after infiltration of 30 cc of quarter percent Sensorcaine with epinephrine.3-0 V-loc to close the skin with skin staples.  Incisional wound VAC applied and e patient was sent to recovery in stable condition.   PLAN OF CARE: Admit to inpatient

## 2020-06-20 DIAGNOSIS — S72002A Fracture of unspecified part of neck of left femur, initial encounter for closed fracture: Secondary | ICD-10-CM | POA: Diagnosis not present

## 2020-06-20 DIAGNOSIS — F1010 Alcohol abuse, uncomplicated: Secondary | ICD-10-CM | POA: Diagnosis not present

## 2020-06-20 DIAGNOSIS — W19XXXA Unspecified fall, initial encounter: Secondary | ICD-10-CM | POA: Diagnosis not present

## 2020-06-20 DIAGNOSIS — Z720 Tobacco use: Secondary | ICD-10-CM | POA: Diagnosis not present

## 2020-06-20 LAB — BASIC METABOLIC PANEL
Anion gap: 8 (ref 5–15)
BUN: 19 mg/dL (ref 8–23)
CO2: 21 mmol/L — ABNORMAL LOW (ref 22–32)
Calcium: 8.3 mg/dL — ABNORMAL LOW (ref 8.9–10.3)
Chloride: 104 mmol/L (ref 98–111)
Creatinine, Ser: 0.87 mg/dL (ref 0.61–1.24)
GFR, Estimated: >60 mL/min (ref >60)
Glucose, Bld: 118 mg/dL — ABNORMAL HIGH (ref 70–99)
Potassium: 4 mmol/L (ref 3.5–5.1)
Sodium: 133 mmol/L — ABNORMAL LOW (ref 135–145)

## 2020-06-20 LAB — CBC
HCT: 35.5 % — ABNORMAL LOW (ref 39.0–52.0)
Hemoglobin: 12.5 g/dL — ABNORMAL LOW (ref 13.0–17.0)
MCH: 32.1 pg (ref 26.0–34.0)
MCHC: 35.2 g/dL (ref 30.0–36.0)
MCV: 91 fL (ref 80.0–100.0)
Platelets: 301 10*3/uL (ref 150–400)
RBC: 3.9 MIL/uL — ABNORMAL LOW (ref 4.22–5.81)
RDW: 14.1 % (ref 11.5–15.5)
WBC: 11.7 10*3/uL — ABNORMAL HIGH (ref 4.0–10.5)
nRBC: 0 % (ref 0.0–0.2)

## 2020-06-20 NOTE — Progress Notes (Signed)
  Subjective: 1 Day Post-Op Procedure(s) (LRB): TOTAL HIP ARTHROPLASTY (Left) Patient reports pain as well-controlled.  Wife Charles Ray at bedside. Patient is well, and has had no acute complaints or problems Plan is to go Skilled nursing facility after hospital stay. Negative for chest pain and shortness of breath Fever: no Gastrointestinal: negative for nausea and vomiting.   Patient has not had a bowel movement.  Objective: Vital signs in last 24 hours: Temp:  [97.5 F (36.4 C)-99.1 F (37.3 C)] 98.8 F (37.1 C) (03/12 1145) Pulse Rate:  [73-102] 95 (03/12 1145) Resp:  [14-19] 18 (03/12 1145) BP: (103-155)/(60-87) 103/60 (03/12 1145) SpO2:  [88 %-100 %] 96 % (03/12 1145)  Intake/Output from previous day:  Intake/Output Summary (Last 24 hours) at 06/20/2020 1301 Last data filed at 06/20/2020 1036 Gross per 24 hour  Intake 2105.58 ml  Output 1150 ml  Net 955.58 ml    Intake/Output this shift: Total I/O In: 240 [P.O.:240] Out: -   Labs: Recent Labs    06/18/20 2055 06/18/20 2256 06/19/20 2029 06/20/20 0532  HGB 12.6* 13.2 12.4* 12.5*   Recent Labs    06/19/20 2029 06/20/20 0532  WBC 14.6* 11.7*  RBC 4.00* 3.90*  HCT 37.7* 35.5*  PLT 311 301   Recent Labs    06/18/20 2256 06/19/20 2029 06/20/20 0532  NA 138  --  133*  K 4.2  --  4.0  CL 104  --  104  CO2 27  --  21*  BUN 41*  --  19  CREATININE 1.03 0.84 0.87  GLUCOSE 103*  --  118*  CALCIUM 8.8*  --  8.3*   Recent Labs    06/18/20 2256  INR 1.0     EXAM General - Patient is Alert, Appropriate and Oriented Extremity - Neurovascular intact Dorsiflexion/Plantar flexion intact Compartment soft Dressing/Incision -Pravena in place and working properly Motor Function - intact, moving foot and toes well on exam.    Assessment/Plan: 1 Day Post-Op Procedure(s) (LRB): TOTAL HIP ARTHROPLASTY (Left) Active Problems:   Tobacco use   Femur fracture, left (HCC)   Alcohol abuse   Closed fracture of  left hip (HCC)   Fall  Estimated body mass index is 16.92 kg/m as calculated from the following:   Height as of this encounter: 6\' 6"  (1.981 m).   Weight as of this encounter: 66.4 kg. Advance diet Up with therapy    DVT Prophylaxis - Lovenox, foot pumps and SCDs Weight-Bearing as tolerated to left leg  , PA-C [facility] Orthopaedic Surgery 06/20/2020, 1:01 PM

## 2020-06-20 NOTE — Evaluation (Signed)
Occupational Therapy Evaluation Patient Details Name: Charles Ray MRN: [id number] DOB: [birth year] Today's Date: 06/20/2020    History of Present Illness Charles Ray is a 70 y.o. male undergoing a left total hip arthroplasty (anterior approach) following a mechancial fall resulting in displaced transcervical fracture of proximal left femur.   Clinical Impression   Pt seen for OT evaluation this date, POD#1 from above surgery. Pt was independent in all ADLs prior to surgery,  however occasionally using a walking stick for mobility due to balance concerns. Pt reports having 3 or 4 falls in the previous 3 months. Pt currently requires Max A for LB dressing while in seated position due to pain, hand and foot tremors, and limited AROM of L hip. Mod A for sit<>stand, transfers, ambulation. Pt with poor standing balance, refusing to use RW. Provided educ re: self care skills, falls prevention strategies, home/routines modifications, DME/AE for LB bathing and dressing tasks, car transfer techniques, D/C recs. Pt would benefit from additional OT during acute hospitalization. Recommend STR post discharge to improve pt's functional mobility and strength and to reduce falls risks. Pt and wife in agreement with this recommendation.     Follow Up Recommendations  SNF    Equipment Recommendations  Tub/shower seat;3 in 1 bedside commode    Recommendations for Other Services       Precautions / Restrictions Precautions Precautions: Fall Precaution Comments: hip (anterior approach) Restrictions Weight Bearing Restrictions: Yes LLE Weight Bearing: Weight bearing as tolerated      Mobility Bed Mobility Overal bed mobility: Needs Assistance Bed Mobility: Supine to Sit     Supine to sit: Min guard          Transfers Overall transfer level: Needs assistance Transfers: Sit to/from Stand Sit to Stand: Mod assist   General transfer comment: Pt refusing to use RW    Balance Overall  balance assessment: Needs assistance Sitting-balance support: No upper extremity supported;Feet supported Sitting balance-Leahy Scale: Good     Standing balance support: Bilateral upper extremity supported Standing balance-Leahy Scale: Poor                             ADL either performed or assessed with clinical judgement   ADL Overall ADL's : Needs assistance/impaired Eating/Feeding: Independent Eating/Feeding Details (indicate cue type and reason): much spilled food, 2/2 hand tremors Grooming: Set up           Upper Body Dressing : Minimal assistance Upper Body Dressing Details (indicate cue type and reason): required VCs for arm placement into sleeves Lower Body Dressing: Maximal assistance Lower Body Dressing Details (indicate cue type and reason): for donning/doffing socks                     Vision Baseline Vision/History: Wears glasses Wears Glasses: At all times Patient Visual Report: No change from baseline Additional Comments: from recliner and while wearing glasses, pt unable to read phone #s on white board in room (~ 10 ft away)     Perception     Praxis      Pertinent Vitals/Pain Pain Assessment: 0-10 Pain Score: 8  Pain Location: L hip Pain Intervention(s): Limited activity within patient's tolerance;RN gave pain meds during session;Repositioned;Monitored during session     Hand Dominance     Extremity/Trunk Assessment Upper Extremity Assessment Upper Extremity Assessment: Overall WFL for tasks assessed   Lower Extremity Assessment Lower Extremity Assessment: LLE deficits/detail LLE  Deficits / Details: s/p Left THA       Communication Communication Communication: No difficulties   Cognition Arousal/Alertness: Awake/alert Behavior During Therapy: WFL for tasks assessed/performed Overall Cognitive Status: Within Functional Limits for tasks assessed                                     General Comments   tremors in b/l UE and LE; 2 lacerations on face, 2/2 fall    Exercises Total Joint Exercises Ankle Circles/Pumps: AROM;10 reps;Both;Seated Other Exercises Other Exercises: Provided education re: AE, DME, home/routine modifications, DC recs. Engaged in bed mobility, sit<>stand, transfers, grooming, UB and LB dressing, feeding, ambulation, toileting   Shoulder Instructions      Home Living Family/patient expects to be discharged to:: Private residence Living Arrangements: Spouse/significant other Available Help at Discharge: Family Type of Home: House Home Access: Stairs to enter Secretary/administrator of Steps: 3 Entrance Stairs-Rails: None Home Layout: One level     Bathroom Shower/Tub: Walk-in shower;Tub/shower unit   Bathroom Toilet: Standard     Home Equipment: Environmental consultant - 2 wheels;Other (comment);Grab bars - tub/shower   Additional Comments: walking stick      Prior Functioning/Environment Level of Independence: Independent                 OT Problem List: Decreased strength;Decreased range of motion;Decreased activity tolerance;Impaired balance (sitting and/or standing);Decreased safety awareness;Pain;Decreased knowledge of use of DME or AE      OT Treatment/Interventions: Self-care/ADL training;Therapeutic exercise;Patient/family education;Balance training;Energy conservation;Therapeutic activities;DME and/or AE instruction    OT Goals(Current goals can be found in the care plan section) Acute Rehab OT Goals Patient Stated Goal: to go home OT Goal Formulation: With patient Time For Goal Achievement: 07/04/20 Potential to Achieve Goals: Good ADL Goals Pt Will Perform Lower Body Dressing: with adaptive equipment;sit to/from stand;with min guard assist Pt Will Perform Toileting - Clothing Manipulation and hygiene: with min guard assist;sit to/from stand Pt Will Perform Tub/Shower Transfer: with modified independence;Stand pivot transfer;shower seat  OT  Frequency: Min 1X/week   Barriers to D/C:            Co-evaluation              AM-PAC OT "6 Clicks" Daily Activity     Outcome Measure Help from another person eating meals?: A Little Help from another person taking care of personal grooming?: A Little Help from another person toileting, which includes using toliet, bedpan, or urinal?: A Lot Help from another person bathing (including washing, rinsing, drying)?: A Lot Help from another person to put on and taking off regular upper body clothing?: A Little Help from another person to put on and taking off regular lower body clothing?: A Lot 6 Click Score: 15   End of Session Equipment Utilized During Treatment: Engineer, water Communication:  (pt requests nicotine patch)  Activity Tolerance: Patient tolerated treatment well Patient left: in chair;with call bell/phone within reach;with chair alarm set;with nursing/sitter in room;with family/visitor present  OT Visit Diagnosis: Unsteadiness on feet (R26.81);Other abnormalities of gait and mobility (R26.89);Muscle weakness (generalized) (M62.81);History of falling (Z91.81);Pain Pain - Right/Left: Left Pain - part of body: Hip                Time: 5732-2025 OT Time Calculation (min): 70 min Charges:  OT General Charges $OT Visit: 1 Visit OT Evaluation $OT Eval Moderate Complexity: 1 Mod OT Treatments $  Self Care/Home Management : 53-67 mins $Therapeutic Exercise: 8-22 mins  Latina Craver, PhD, MS, OTR/L 06/20/20, 10:49 AM

## 2020-06-20 NOTE — Evaluation (Signed)
Physical Therapy Evaluation Patient Details Name: Charles Ray MRN: [id number] DOB: [birth year] Today's Date: 06/20/2020   History of Present Illness  Pt is a 70 y/o M admitted on 06/18/20 with c/c of L hip pain following a fall after tripping on the sidewalk. 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 Rosita Kea on 06/19/20. Pt is now WBAT LLE. PMH: depression, alcohol abuse, tobacco abuse  Clinical Impression  Pt seen for PT evaluation with sitter & wife present for session. Pt restless & impulsive, requiring max cuing to remain sitting until PT is ready for pt to stand, as well as max education re: need for PT intervention & need for medical clearance prior to d/c. Pt is able to ambulate a short distance in room with mod/max assist from PT & 2nd person managing IV pole. Pt requires MAX cuing throughout for proper use of RW (ambulate within base of AD & to not push it too far out in front of him). Pt demonstrates LLE weakness throughout gait & notes feeling "dizzy" after ambulating - BP checked & nurse made aware. Pt would benefit from STR upon d/c to maximize independence with functional mobility, reduce fall risk prior to return home, & decrease caregiver burden. Will continue to see pt acutely for strengthening, balance, & progress gait with LRAD.      Follow Up Recommendations SNF;Supervision/Assistance - 24 hour    Equipment Recommendations  None recommended by PT    Recommendations for Other Services       Precautions / Restrictions Precautions Precautions: Fall;Anterior Hip Precaution Comments: hip (anterior approach) Restrictions Weight Bearing Restrictions: Yes LLE Weight Bearing: Weight bearing as tolerated      Mobility  Bed Mobility Overal bed mobility: Needs Assistance Bed Mobility: Supine to Sit     Supine to sit: Min guard     General bed mobility comments: not observed, pt received sitting  EOB & left sitting in recliner    Transfers Overall transfer level: Needs assistance Equipment used: Rolling walker (2 wheeled) Transfers: Sit to/from Stand Sit to Stand: Mod assist;From elevated surface Stand pivot transfers: Mod assist       General transfer comment: max cuing for safe hand placement/to push to standing  Ambulation/Gait Ambulation/Gait assistance: Mod assist;+2 safety/equipment;Max assist (2nd person managing IV pole) Gait Distance (Feet): 12 Feet Assistive device: Rolling walker (2 wheeled) Gait Pattern/deviations: Decreased step length - left;Decreased step length - right;Decreased stride length;Decreased dorsiflexion - left;Decreased weight shift to left Gait velocity: decreased   General Gait Details: decreased LLE foot clearance, L knee maintains flexion throughout majority of ambulation  Stairs            Wheelchair Mobility    Modified Rankin (Stroke Patients Only)       Balance Overall balance assessment: Needs assistance Sitting-balance support: No upper extremity supported;Feet supported Sitting balance-Leahy Scale: Good     Standing balance support: Bilateral upper extremity supported Standing balance-Leahy Scale: Poor Standing balance comment: requires BUE support on RW                             Pertinent Vitals/Pain Pain Assessment: 0-10 Pain Score: 8  Pain Location: L hip Pain Descriptors / Indicators: Aching;Sore Pain Intervention(s): Monitored during session;Limited activity within patient's tolerance (sitter reports pt is premedicated)    Home Living Family/patient expects to be discharged to:: Skilled nursing  facility Living Arrangements: Spouse/significant other Available Help at Discharge: Family Type of Home: House Home Access: Stairs to enter Entrance Stairs-Rails: None Entrance Stairs-Number of Steps: 3 Home Layout: One level Home Equipment: Walker - 2 wheels;Other (comment);Grab bars -  tub/shower Additional Comments: walking stick    Prior Function Level of Independence: Independent         Comments: without AD, no other falls than this one     Hand Dominance        Extremity/Trunk Assessment   Upper Extremity Assessment Upper Extremity Assessment: Overall WFL for tasks assessed    Lower Extremity Assessment Lower Extremity Assessment: LLE deficits/detail LLE Deficits / Details: s/p Left THA, 2/5 L knee extension in sitting       Communication   Communication: No difficulties  Cognition Arousal/Alertness: Awake/alert Behavior During Therapy: Impulsive Overall Cognitive Status: Difficult to assess                                 General Comments: Pt AxOx4 but still speaking about going home, requires sitter for safety      General Comments General comments (skin integrity, edema, etc.): BUE tremors noted, pt c/o feeling dizzy after ambulating & BP 103/60 mmHg In LUE (MAP 74) - nurse notified, provided pt with HEP handout but did not review exercises yet    Exercises Total Joint Exercises Ankle Circles/Pumps: AROM;10 reps;Both;Seated Long Arc Quad: AROM;Strengthening;Left;10 reps;Seated (pt supporting under thigh with BUE)    Assessment/Plan    PT Assessment Patient needs continued PT services  PT Problem List Decreased balance;Decreased strength;Decreased cognition;Decreased knowledge of precautions;Pain;Decreased knowledge of use of DME;Decreased mobility;Decreased activity tolerance;Decreased safety awareness;Decreased skin integrity       PT Treatment Interventions DME instruction;Functional mobility training;Balance training;Patient/family education;Modalities;Neuromuscular re-education;Wheelchair mobility training;Therapeutic activities;Gait training;Stair training;Therapeutic exercise;Cognitive remediation;Manual techniques    PT Goals (Current goals can be found in the Care Plan section)  Acute Rehab PT Goals Patient  Stated Goal: go home PT Goal Formulation: With patient/family Time For Goal Achievement: 07/04/20 Potential to Achieve Goals: Fair    Frequency BID   Barriers to discharge Decreased caregiver support;Inaccessible home environment      Co-evaluation               AM-PAC PT "6 Clicks" Mobility  Outcome Measure Help needed turning from your back to your side while in a flat bed without using bedrails?: A Little Help needed moving from lying on your back to sitting on the side of a flat bed without using bedrails?: A Lot Help needed moving to and from a bed to a chair (including a wheelchair)?: A Lot Help needed standing up from a chair using your arms (e.g., wheelchair or bedside chair)?: A Lot Help needed to walk in hospital room?: Total Help needed climbing 3-5 steps with a railing? : Total 6 Click Score: 11    End of Session Equipment Utilized During Treatment: Gait belt Activity Tolerance: Patient tolerated treatment well Patient left: in chair;with call bell/phone within reach;with chair alarm set;with family/visitor present;with nursing/sitter in room Nurse Communication: Mobility status (BP & c/o dizziness, potential need for ted hose) PT Visit Diagnosis: Unsteadiness on feet (R26.81);Muscle weakness (generalized) (M62.81);Difficulty in walking, not elsewhere classified (R26.2);Pain Pain - Right/Left: Left Pain - part of body: Hip    Time: 6160-7371 PT Time Calculation (min) (ACUTE ONLY): 20 min   Charges:   PT Evaluation $PT Eval Low Complexity:  1 Low PT Treatments $Therapeutic Activity: 8-22 mins        Aleda Grana, PT, DPT 06/20/20, 12:52 PM   Sandi Mariscal 06/20/2020, 12:49 PM

## 2020-06-20 NOTE — Progress Notes (Signed)
Foley catheter removed. Patient tolerated well. Patient given urinal at bedside.

## 2020-06-20 NOTE — Progress Notes (Signed)
Physical Therapy Treatment Patient Details Name: Charles Ray MRN: [id number] DOB: 05/19/1950 Today's Date: 06/20/2020    History of Present Illness Pt is a 70 y/o M admitted on 06/18/20 with c/c of L hip pain following a fall after tripping on the sidewalk. 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 Rosita Kea on 06/19/20. Pt is now WBAT LLE. PMH: depression, alcohol abuse, tobacco abuse    PT Comments    Pt seen for PT tx with son present for session. Pt is able to ambulate a total of 10 ft with pt reporting "that's about all my ankle can take" (but anticipate pt meant L hip) - with pt reporting he's limited 2/2 pain & fatigue. Pt performs LLE strengthening exercises with cuing/education from PT. During earlier session pt reports only this fall but during this session pt's son notes pt falls every other day at home.     Follow Up Recommendations  SNF;Supervision/Assistance - 24 hour     Equipment Recommendations  None recommended by PT    Recommendations for Other Services       Precautions / Restrictions Precautions Precautions: Fall;Anterior Hip Precaution Comments: hip (anterior approach) Restrictions Weight Bearing Restrictions: Yes LLE Weight Bearing: Weight bearing as tolerated    Mobility  Bed Mobility Overal bed mobility: Needs Assistance Bed Mobility: Supine to Sit     Supine to sit: Min guard     General bed mobility comments: not observed, pt received sitting EOB & left sitting in recliner    Transfers Overall transfer level: Needs assistance Equipment used: Rolling walker (2 wheeled) Transfers: Sit to/from Stand Sit to Stand: Mod assist Stand pivot transfers: Mod assist       General transfer comment: max cuing for safe hand placement/to push to standing  Ambulation/Gait Ambulation/Gait assistance: Mod assist;+2 safety/equipment Gait Distance (Feet):  (5 ft forwards + 5 ft  backwards) Assistive device: Rolling walker (2 wheeled) Gait Pattern/deviations: Decreased step length - left;Decreased step length - right;Decreased stride length;Decreased dorsiflexion - left;Decreased weight shift to left Gait velocity: decreased   General Gait Details: decreased LLE foot clearance, absent LLE heel strike   Stairs             Wheelchair Mobility    Modified Rankin (Stroke Patients Only)       Balance Overall balance assessment: Needs assistance Sitting-balance support: No upper extremity supported;Feet supported Sitting balance-Leahy Scale: Good     Standing balance support: Bilateral upper extremity supported Standing balance-Leahy Scale: Poor Standing balance comment: requires BUE support on RW                            Cognition Arousal/Alertness: Awake/alert Behavior During Therapy: Impulsive Overall Cognitive Status: Difficult to assess                                 General Comments: Pt AxOx4, requires sitter for safety      Exercises Total Joint Exercises Short Arc Quad: AAROM;Strengthening;Left;10 reps;Seated (seated in recliner with BLE elevated on footrest) Heel Slides: AAROM;Strengthening;Left;10 reps;Seated (seated in recliner with BLE elevated on footrest) Hip ABduction/ADduction: AROM;Strengthening;Both;10 reps;Seated (seated in recliner with BLE elevated on footrest;  hip adduction pillow squeezes) Long Arc Quad: AROM;Strengthening;Left;10 reps;Seated (BUE underneath thigh for support)    General Comments General comments (skin integrity,  edema, etc.): PT dons BLE ted hose at beginning of session.      Pertinent Vitals/Pain Pain Assessment: Faces Pain Score: 8  Faces Pain Scale: Hurts even more Pain Location: L hip Pain Descriptors / Indicators: Sore Pain Intervention(s): Limited activity within patient's tolerance;Monitored during session    Home Living Family/patient expects to be discharged  to:: Skilled nursing facility Living Arrangements: Spouse/significant other Available Help at Discharge: Family Type of Home: House Home Access: Stairs to enter Entrance Stairs-Rails: None Home Layout: One level Home Equipment: Environmental consultant - 2 wheels;Other (comment);Grab bars - tub/shower Additional Comments: walking stick    Prior Function Level of Independence: Independent      Comments: without AD, no other falls than this one   PT Goals (current goals can now be found in the care plan section) Acute Rehab PT Goals Patient Stated Goal: go home PT Goal Formulation: With patient/family Time For Goal Achievement: 07/04/20 Potential to Achieve Goals: Fair Progress towards PT goals: Progressing toward goals    Frequency    BID      PT Plan Current plan remains appropriate    Co-evaluation              AM-PAC PT "6 Clicks" Mobility   Outcome Measure  Help needed turning from your back to your side while in a flat bed without using bedrails?: A Little Help needed moving from lying on your back to sitting on the side of a flat bed without using bedrails?: A Lot Help needed moving to and from a bed to a chair (including a wheelchair)?: A Lot Help needed standing up from a chair using your arms (e.g., wheelchair or bedside chair)?: A Lot Help needed to walk in hospital room?: Total Help needed climbing 3-5 steps with a railing? : Total 6 Click Score: 11    End of Session Equipment Utilized During Treatment: Gait belt Activity Tolerance: Patient limited by fatigue;Patient limited by pain Patient left: in chair;with call bell/phone within reach;with chair alarm set;with family/visitor present Psychiatrist re-entering room) Nurse Communication:  (PT donned ted hose) PT Visit Diagnosis: Unsteadiness on feet (R26.81);Muscle weakness (generalized) (M62.81);Difficulty in walking, not elsewhere classified (R26.2);Pain;Repeated falls (R29.6) Pain - Right/Left: Left Pain - part of  body: Hip     Time: 6644-0347 PT Time Calculation (min) (ACUTE ONLY): 17 min  Charges:  $Therapeutic Activity: 8-22 mins                     Aleda Grana, PT, DPT 06/20/20, 1:54 PM    Sandi Mariscal 06/20/2020, 1:52 PM

## 2020-06-20 NOTE — Progress Notes (Signed)
PROGRESS NOTE    Charles Barterhomas C Parker  ZOX:[id number] DOB: May 02, 1950 DOA: 06/18/2020 PCP: Jerl MinaHedrick, James, MD   Brief Narrative: Taken from H&P. Charles Ray is a 70 y.o. male with medical history significant for depression, alcohol abuse, tobacco abuse, presents to the emergency department for chief concerns of left hip pain after having a mechanical fall.  Denies any presyncope or syncope.  Found to have displaced transcervical fracture of proximal left femur.  Orthopedic was consulted and he underwent successful left total hip replacement on 06/19/2020.  Subjective: Patient was sitting comfortably in chair when seen today.  Wife at bedside.  Stating that pain is well controlled.  Plan is to go to SNF before returning home.  Assessment & Plan:   Active Problems:   Tobacco use   Femur fracture, left (HCC)   Alcohol abuse   Closed fracture of left hip (HCC)   Fall  Acute displaced transcervical fracture of proximal left femur.  Secondary to mechanical fall.  Underwent total hip replacement yesterday. -Continue with pain management. -Follow-up post surgical recommendations. -PT/OT evaluation -SNF placement -TOC consult -Postoperative hemoglobin at 12.5.  Alcohol abuse.  Patient drinks half a pint daily.  Last drink was on 06/17/2020.  No sign of withdrawal at this time. -Continue with CIWA protocol  Tobacco abuse.  Smokes 1 pack/day -Continue with nicotine patch  History of depression.  No acute concern. -Continue with home dose of amitriptyline at night.  Objective: Vitals:   06/19/20 2345 06/20/20 0408 06/20/20 0800 06/20/20 1145  BP: (!) 146/84 139/84 107/67 103/60  Pulse: 89 (!) 101 (!) 102 95  Resp: 17 18 18 18   Temp: 98.9 F (37.2 C) 99.1 F (37.3 C) 98.1 F (36.7 C) 98.8 F (37.1 C)  TempSrc:   Oral Oral  SpO2: 91% 93% 95% 96%  Weight:      Height:        Intake/Output Summary (Last 24 hours) at 06/20/2020 1534 Last data filed at 06/20/2020 1401 Gross per 24  hour  Intake 2345.58 ml  Output 1150 ml  Net 1195.58 ml   Filed Weights   06/18/20 1944 06/18/20 2305  Weight: 76.2 kg 66.4 kg    Examination:  General.  Elderly gentleman, in no acute distress.  Wound VAC in place. Pulmonary.  Lungs clear bilaterally, normal respiratory effort. CV.  Regular rate and rhythm, no JVD, rub or murmur. Abdomen.  Soft, nontender, nondistended, BS positive. CNS.  Alert and oriented x3.  No focal neurologic deficit. Extremities.  No edema, no cyanosis, pulses intact and symmetrical. Psychiatry.  Judgment and insight appears normal.  DVT prophylaxis: Lovenox Code Status: Full Family Communication: Wife was updated at bedside Disposition Plan:  Status is: Inpatient  Remains inpatient appropriate because:Inpatient level of care appropriate due to severity of illness   Dispo: The patient is from: Home              Anticipated d/c is to: SNF              Patient currently is not medically stable to d/c.   Difficult to place patient No              Level of care: Med-Surg  All the records are reviewed and case discussed with Care Management/Social Worker. Management plans discussed with the patient, nursing and they are in agreement.  Consultants:   Orthopedic surgery  Procedures:  Antimicrobials:   Data Reviewed: I have personally reviewed following labs and imaging studies  CBC: Recent Labs  Lab 06/18/20 2055 06/18/20 2256 06/19/20 2029 06/20/20 0532  WBC 12.2* 14.2* 14.6* 11.7*  NEUTROABS 9.6*  --   --   --   HGB 12.6* 13.2 12.4* 12.5*  HCT 37.9* 39.2 37.7* 35.5*  MCV 93.8 92.7 94.3 91.0  PLT 375 366 311 301   Basic Metabolic Panel: Recent Labs  Lab 06/18/20 2055 06/18/20 2256 06/19/20 2029 06/20/20 0532  NA 138 138  --  133*  K 4.6 4.2  --  4.0  CL 104 104  --  104  CO2 26 27  --  21*  GLUCOSE 93 103*  --  118*  BUN 40* 41*  --  19  CREATININE 1.02 1.03 0.84 0.87  CALCIUM 8.7* 8.8*  --  8.3*  MG  --  2.1  --   --    PHOS  --  2.9  --   --    GFR: Estimated Creatinine Clearance: 72.1 mL/min (by C-G formula based on SCr of 0.87 mg/dL). Liver Function Tests: Recent Labs  Lab 06/18/20 2256  AST 17  ALT 16  ALKPHOS 106  BILITOT 0.5  PROT 6.7  ALBUMIN 3.4*   No results for input(s): LIPASE, AMYLASE in the last 168 hours. No results for input(s): AMMONIA in the last 168 hours. Coagulation Profile: Recent Labs  Lab 06/18/20 2256  INR 1.0   Cardiac Enzymes: No results for input(s): CKTOTAL, CKMB, CKMBINDEX, TROPONINI in the last 168 hours. BNP (last 3 results) No results for input(s): PROBNP in the last 8760 hours. HbA1C: No results for input(s): HGBA1C in the last 72 hours. CBG: No results for input(s): GLUCAP in the last 168 hours. Lipid Profile: No results for input(s): CHOL, HDL, LDLCALC, TRIG, CHOLHDL, LDLDIRECT in the last 72 hours. Thyroid Function Tests: No results for input(s): TSH, T4TOTAL, FREET4, T3FREE, THYROIDAB in the last 72 hours. Anemia Panel: No results for input(s): VITAMINB12, FOLATE, FERRITIN, TIBC, IRON, RETICCTPCT in the last 72 hours. Sepsis Labs: No results for input(s): PROCALCITON, LATICACIDVEN in the last 168 hours.  Recent Results (from the past 240 hour(s))  Resp Panel by RT-PCR (Flu A&B, Covid) Nasopharyngeal Swab     Status: None   Collection Time: 06/18/20  8:55 PM   Specimen: Nasopharyngeal Swab; Nasopharyngeal(NP) swabs in vial transport medium  Result Value Ref Range Status   SARS Coronavirus 2 by RT PCR NEGATIVE NEGATIVE Final    Comment: (NOTE) SARS-CoV-2 target nucleic acids are NOT DETECTED.  The SARS-CoV-2 RNA is generally detectable in upper respiratory specimens during the acute phase of infection. The lowest concentration of SARS-CoV-2 viral copies this assay can detect is 138 copies/mL. A negative result does not preclude SARS-Cov-2 infection and should not be used as the sole basis for treatment or other patient management decisions. A  negative result may occur with  improper specimen collection/handling, submission of specimen other than nasopharyngeal swab, presence of viral mutation(s) within the areas targeted by this assay, and inadequate number of viral copies(<138 copies/mL). A negative result must be combined with clinical observations, patient history, and epidemiological information. The expected result is Negative.  Fact Sheet for Patients:  BloggerCourse.com  Fact Sheet for Healthcare Providers:  SeriousBroker.it  This test is no t yet approved or cleared by the [place] FDA and  has been authorized for detection and/or diagnosis of SARS-CoV-2 by FDA under an Emergency Use Authorization (EUA). This EUA will remain  in effect (meaning this test can be used) for the  duration of the COVID-19 declaration under Section 564(b)(1) of the Act, 21 U.S.C.section 360bbb-3(b)(1), unless the authorization is terminated  or revoked sooner.       Influenza A by PCR NEGATIVE NEGATIVE Final   Influenza B by PCR NEGATIVE NEGATIVE Final    Comment: (NOTE) The Xpert Xpress SARS-CoV-2/FLU/RSV plus assay is intended as an aid in the diagnosis of influenza from Nasopharyngeal swab specimens and should not be used as a sole basis for treatment. Nasal washings and aspirates are unacceptable for Xpert Xpress SARS-CoV-2/FLU/RSV testing.  Fact Sheet for Patients: BloggerCourse.com  Fact Sheet for Healthcare Providers: SeriousBroker.it  This test is not yet approved or cleared by the [place] FDA and has been authorized for detection and/or diagnosis of SARS-CoV-2 by FDA under an Emergency Use Authorization (EUA). This EUA will remain in effect (meaning this test can be used) for the duration of the COVID-19 declaration under Section 564(b)(1) of the Act, 21 U.S.C. section 360bbb-3(b)(1), unless the authorization  is terminated or revoked.  Performed at [facility], [address] [place], [place] [address]   Surgical PCR screen     Status: None   Collection Time: 06/19/20  5:14 AM   Specimen: Nasal Mucosa; Nasal Swab  Result Value Ref Range Status   MRSA, PCR NEGATIVE NEGATIVE Final   Staphylococcus aureus NEGATIVE NEGATIVE Final    Comment: (NOTE) The Xpert SA Assay (FDA approved for NASAL specimens in patients 70 years of age and older), is one component of a comprehensive surveillance program. It is not intended to diagnose infection nor to guide or monitor treatment. Performed at [facility], [address] [place], [place] [address]      Radiology Studies: DG Chest 1 View  Result Date: 06/18/2020 CLINICAL DATA:  Pain status post fall. EXAM: CHEST  1 VIEW COMPARISON:  March 13, 2018 FINDINGS: The heart size and mediastinal contours are within normal limits. Both lungs are clear. The visualized skeletal structures are unremarkable. Aortic calcifications are noted. Emphysematous changes are noted. There are old healed left-sided rib fractures. IMPRESSION: No active disease. Electronically Signed   By: Katherine Mantle M.D.   On: 06/18/2020 20:34   DG HIP OPERATIVE UNILAT WITH PELVIS LEFT  Result Date: 06/19/2020 CLINICAL DATA:  Left hip arthroplasty. EXAM: OPERATIVE LEFT HIP (WITH PELVIS IF PERFORMED) TECHNIQUE: Fluoroscopic spot image(s) were submitted for interpretation post-operatively. COMPARISON:  Preoperative radiographs yesterday. FINDINGS: Two fluoroscopic spot views of the left hip obtained in the operating room. Interval left hip arthroplasty. Fluoroscopy time 12 seconds. Dose not provided. IMPRESSION: Procedural fluoroscopy during left hip arthroplasty. Electronically Signed   By: Narda Rutherford M.D.   On: 06/19/2020 19:02   DG HIP UNILAT W OR W/O PELVIS 2-3 VIEWS LEFT  Result Date: 06/19/2020 CLINICAL DATA:  Postop. EXAM: DG HIP (WITH OR WITHOUT  PELVIS) 2-3V LEFT COMPARISON:  Preoperative radiograph yesterday. FINDINGS: Left hip arthroplasty in expected alignment. No periprosthetic lucency or fracture. Recent postsurgical change includes air and edema in the soft tissues and lateral skin staples. IMPRESSION: Left hip arthroplasty without immediate postoperative complication. Electronically Signed   By: Narda Rutherford M.D.   On: 06/19/2020 19:00   DG Hip Unilat W or Wo Pelvis 2-3 Views Left  Result Date: 06/18/2020 CLINICAL DATA:  Pain EXAM: DG HIP (WITH OR WITHOUT PELVIS) 2-3V LEFT COMPARISON:  None. FINDINGS: There is an acute displaced transcervical fracture of the proximal left femur. There is no dislocation. There is osteopenia. There are no  significant degenerative changes. IMPRESSION: Acute displaced transcervical fracture of the proximal left femur. Electronically Signed   By: Katherine Mantle M.D.   On: 06/18/2020 20:28    Scheduled Meds: . amitriptyline  300 mg Oral QHS  . docusate sodium  100 mg Oral BID  . enoxaparin (LOVENOX) injection  40 mg Subcutaneous Q24H  . folic acid  1 mg Oral Daily  . multivitamin with minerals  1 tablet Oral Daily  . mupirocin ointment  1 application Nasal BID  . pantoprazole  40 mg Oral Daily  . thiamine  100 mg Oral Daily   Or  . thiamine  100 mg Intravenous Daily   Continuous Infusions: . sodium chloride Stopped (06/20/20 0302)     LOS: 2 days   Time spent: 25 minutes. More than 50% of the time was spent in counseling/coordination of care  Arnetha Courser, MD [facility]  If 7PM-7AM, please contact night-coverage [email or web address]  06/20/2020, 3:34 PM   This record has been created using Conservation officer, historic buildings. Errors have been sought and corrected,but may not always be located. Such creation errors do not reflect on the standard of care.

## 2020-06-21 DIAGNOSIS — S72002A Fracture of unspecified part of neck of left femur, initial encounter for closed fracture: Secondary | ICD-10-CM | POA: Diagnosis not present

## 2020-06-21 DIAGNOSIS — W19XXXA Unspecified fall, initial encounter: Secondary | ICD-10-CM | POA: Diagnosis not present

## 2020-06-21 DIAGNOSIS — F1010 Alcohol abuse, uncomplicated: Secondary | ICD-10-CM | POA: Diagnosis not present

## 2020-06-21 DIAGNOSIS — Z720 Tobacco use: Secondary | ICD-10-CM | POA: Diagnosis not present

## 2020-06-21 NOTE — Progress Notes (Signed)
PROGRESS NOTE    Charles Ray  JJK:[id number] DOB: June 07, 1950 DOA: 06/18/2020 PCP: Jerl Mina, MD   Brief Narrative: Taken from H&P. Charles Ray is a 70 y.o. male with medical history significant for depression, alcohol abuse, tobacco abuse, presents to the emergency department for chief concerns of left hip pain after having a mechanical fall.  Denies any presyncope or syncope.  Found to have displaced transcervical fracture of proximal left femur.  Orthopedic was consulted and he underwent successful left total hip replacement on 06/19/2020.  Subjective: Patient was sitting upright in his bed, eating breakfast when seen today.  Wife at bedside. Wife at bedside.  Having some 6-7 out of 10 pain and waiting for pain medication.  Assessment & Plan:   Active Problems:   Tobacco use   Femur fracture, left (HCC)   Alcohol abuse   Closed fracture of left hip (HCC)   Fall  Acute displaced transcervical fracture of proximal left femur.  Secondary to mechanical fall.  Underwent total hip replacement yesterday. -Continue with pain management. -Follow-up post surgical recommendations. -PT/OT evaluation -SNF placement -TOC consult -Postoperative hemoglobin at 12.5.  Alcohol abuse.  Patient drinks half a pint daily.  Last drink was on 06/17/2020.  No sign of withdrawal at this time. -Continue with CIWA protocol  Tobacco abuse.  Smokes 1 pack/day -Continue with nicotine patch  History of depression.  No acute concern. -Continue with home dose of amitriptyline at night.  Objective: Vitals:   06/20/20 1145 06/20/20 1552 06/20/20 2107 06/21/20 0505  BP: 103/60 117/80 129/77 95/61  Pulse: 95 (!) 103 88 90  Resp: 18 18 17 17   Temp: 98.8 F (37.1 C) 98 F (36.7 C) 98.2 F (36.8 C) (!) 97.5 F (36.4 C)  TempSrc: Oral Oral Oral Oral  SpO2: 96% 95% 97% 97%  Weight:      Height:        Intake/Output Summary (Last 24 hours) at 06/21/2020 0815 Last data filed at 06/21/2020  06/23/2020 Gross per 24 hour  Intake 800 ml  Output 1350 ml  Net -550 ml   Filed Weights   06/18/20 1944 06/18/20 2305  Weight: 76.2 kg 66.4 kg    Examination:  General.  Well-developed elderly man, in no acute distress. Pulmonary.  Lungs clear bilaterally, normal respiratory effort. CV.  Regular rate and rhythm, no JVD, rub or murmur. Abdomen.  Soft, nontender, nondistended, BS positive. CNS.  Alert and oriented x3.  No focal neurologic deficit. Extremities.  No edema, no cyanosis, pulses intact and symmetrical. Psychiatry.  Judgment and insight appears normal.  DVT prophylaxis: Lovenox Code Status: Full Family Communication: Wife was updated at bedside Disposition Plan:  Status is: Inpatient  Remains inpatient appropriate because:Inpatient level of care appropriate due to severity of illness   Dispo: The patient is from: Home              Anticipated d/c is to: SNF              Patient currently is not medically stable to d/c.   Difficult to place patient No              Level of care: Med-Surg  All the records are reviewed and case discussed with Care Management/Social Worker. Management plans discussed with the patient, nursing and they are in agreement.  Consultants:   Orthopedic surgery  Procedures:  Antimicrobials:   Data Reviewed: I have personally reviewed following labs and imaging studies  CBC: Recent  Labs  Lab 06/18/20 2055 06/18/20 2256 06/19/20 2029 06/20/20 0532  WBC 12.2* 14.2* 14.6* 11.7*  NEUTROABS 9.6*  --   --   --   HGB 12.6* 13.2 12.4* 12.5*  HCT 37.9* 39.2 37.7* 35.5*  MCV 93.8 92.7 94.3 91.0  PLT 375 366 311 301   Basic Metabolic Panel: Recent Labs  Lab 06/18/20 2055 06/18/20 2256 06/19/20 2029 06/20/20 0532  NA 138 138  --  133*  K 4.6 4.2  --  4.0  CL 104 104  --  104  CO2 26 27  --  21*  GLUCOSE 93 103*  --  118*  BUN 40* 41*  --  19  CREATININE 1.02 1.03 0.84 0.87  CALCIUM 8.7* 8.8*  --  8.3*  MG  --  2.1  --   --    PHOS  --  2.9  --   --    GFR: Estimated Creatinine Clearance: 72.1 mL/min (by C-G formula based on SCr of 0.87 mg/dL). Liver Function Tests: Recent Labs  Lab 06/18/20 2256  AST 17  ALT 16  ALKPHOS 106  BILITOT 0.5  PROT 6.7  ALBUMIN 3.4*   No results for input(s): LIPASE, AMYLASE in the last 168 hours. No results for input(s): AMMONIA in the last 168 hours. Coagulation Profile: Recent Labs  Lab 06/18/20 2256  INR 1.0   Cardiac Enzymes: No results for input(s): CKTOTAL, CKMB, CKMBINDEX, TROPONINI in the last 168 hours. BNP (last 3 results) No results for input(s): PROBNP in the last 8760 hours. HbA1C: No results for input(s): HGBA1C in the last 72 hours. CBG: No results for input(s): GLUCAP in the last 168 hours. Lipid Profile: No results for input(s): CHOL, HDL, LDLCALC, TRIG, CHOLHDL, LDLDIRECT in the last 72 hours. Thyroid Function Tests: No results for input(s): TSH, T4TOTAL, FREET4, T3FREE, THYROIDAB in the last 72 hours. Anemia Panel: No results for input(s): VITAMINB12, FOLATE, FERRITIN, TIBC, IRON, RETICCTPCT in the last 72 hours. Sepsis Labs: No results for input(s): PROCALCITON, LATICACIDVEN in the last 168 hours.  Recent Results (from the past 240 hour(s))  Resp Panel by RT-PCR (Flu A&B, Covid) Nasopharyngeal Swab     Status: None   Collection Time: 06/18/20  8:55 PM   Specimen: Nasopharyngeal Swab; Nasopharyngeal(NP) swabs in vial transport medium  Result Value Ref Range Status   SARS Coronavirus 2 by RT PCR NEGATIVE NEGATIVE Final    Comment: (NOTE) SARS-CoV-2 target nucleic acids are NOT DETECTED.  The SARS-CoV-2 RNA is generally detectable in upper respiratory specimens during the acute phase of infection. The lowest concentration of SARS-CoV-2 viral copies this assay can detect is 138 copies/mL. A negative result does not preclude SARS-Cov-2 infection and should not be used as the sole basis for treatment or other patient management decisions. A  negative result may occur with  improper specimen collection/handling, submission of specimen other than nasopharyngeal swab, presence of viral mutation(s) within the areas targeted by this assay, and inadequate number of viral copies(<138 copies/mL). A negative result must be combined with clinical observations, patient history, and epidemiological information. The expected result is Negative.  Fact Sheet for Patients:  BloggerCourse.com  Fact Sheet for Healthcare Providers:  SeriousBroker.it  This test is no t yet approved or cleared by the [place] FDA and  has been authorized for detection and/or diagnosis of SARS-CoV-2 by FDA under an Emergency Use Authorization (EUA). This EUA will remain  in effect (meaning this test can be used) for the duration of  the COVID-19 declaration under Section 564(b)(1) of the Act, 21 U.S.C.section 360bbb-3(b)(1), unless the authorization is terminated  or revoked sooner.       Influenza A by PCR NEGATIVE NEGATIVE Final   Influenza B by PCR NEGATIVE NEGATIVE Final    Comment: (NOTE) The Xpert Xpress SARS-CoV-2/FLU/RSV plus assay is intended as an aid in the diagnosis of influenza from Nasopharyngeal swab specimens and should not be used as a sole basis for treatment. Nasal washings and aspirates are unacceptable for Xpert Xpress SARS-CoV-2/FLU/RSV testing.  Fact Sheet for Patients: BloggerCourse.com  Fact Sheet for Healthcare Providers: SeriousBroker.it  This test is not yet approved or cleared by the [place] FDA and has been authorized for detection and/or diagnosis of SARS-CoV-2 by FDA under an Emergency Use Authorization (EUA). This EUA will remain in effect (meaning this test can be used) for the duration of the COVID-19 declaration under Section 564(b)(1) of the Act, 21 U.S.C. section 360bbb-3(b)(1), unless the authorization  is terminated or revoked.  Performed at [facility], [address], [place], [place] [address]   Surgical PCR screen     Status: None   Collection Time: 06/19/20  5:14 AM   Specimen: Nasal Mucosa; Nasal Swab  Result Value Ref Range Status   MRSA, PCR NEGATIVE NEGATIVE Final   Staphylococcus aureus NEGATIVE NEGATIVE Final    Comment: (NOTE) The Xpert SA Assay (FDA approved for NASAL specimens in patients 93 years of age and older), is one component of a comprehensive surveillance program. It is not intended to diagnose infection nor to guide or monitor treatment. Performed at [facility], [address], [place], [place] [address]      Radiology Studies: DG HIP OPERATIVE UNILAT WITH PELVIS LEFT  Result Date: 06/19/2020 CLINICAL DATA:  Left hip arthroplasty. EXAM: OPERATIVE LEFT HIP (WITH PELVIS IF PERFORMED) TECHNIQUE: Fluoroscopic spot image(s) were submitted for interpretation post-operatively. COMPARISON:  Preoperative radiographs yesterday. FINDINGS: Two fluoroscopic spot views of the left hip obtained in the operating room. Interval left hip arthroplasty. Fluoroscopy time 12 seconds. Dose not provided. IMPRESSION: Procedural fluoroscopy during left hip arthroplasty. Electronically Signed   By: Narda Rutherford M.D.   On: 06/19/2020 19:02   DG HIP UNILAT W OR W/O PELVIS 2-3 VIEWS LEFT  Result Date: 06/19/2020 CLINICAL DATA:  Postop. EXAM: DG HIP (WITH OR WITHOUT PELVIS) 2-3V LEFT COMPARISON:  Preoperative radiograph yesterday. FINDINGS: Left hip arthroplasty in expected alignment. No periprosthetic lucency or fracture. Recent postsurgical change includes air and edema in the soft tissues and lateral skin staples. IMPRESSION: Left hip arthroplasty without immediate postoperative complication. Electronically Signed   By: Narda Rutherford M.D.   On: 06/19/2020 19:00    Scheduled Meds: . amitriptyline  300 mg Oral QHS  . docusate sodium  100 mg Oral BID  .  enoxaparin (LOVENOX) injection  40 mg Subcutaneous Q24H  . folic acid  1 mg Oral Daily  . multivitamin with minerals  1 tablet Oral Daily  . mupirocin ointment  1 application Nasal BID  . pantoprazole  40 mg Oral Daily  . thiamine  100 mg Oral Daily   Or  . thiamine  100 mg Intravenous Daily   Continuous Infusions: . sodium chloride Stopped (06/20/20 0302)     LOS: 3 days   Time spent: 25 minutes. More than 50% of the time was spent in counseling/coordination of care  Arnetha Courser, MD [facility]  If 7PM-7AM, please contact night-coverage [email or web address]  06/21/2020, 8:15 AM  This record has been created using Systems analyst. Errors have been sought and corrected,but may not always be located. Such creation errors do not reflect on the standard of care.

## 2020-06-21 NOTE — Progress Notes (Signed)
Physical Therapy Treatment Patient Details Name: Charles Ray MRN: [id number] DOB: June 15, 1950 Today's Date: 06/21/2020    History of Present Illness Pt is a 70 y/o M admitted on 06/18/20 with c/c of L hip pain following a fall after tripping on the sidewalk. 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 Rosita Kea on 06/19/20. Pt is now WBAT LLE. PMH: depression, alcohol abuse, tobacco abuse    PT Comments    Participated in exercises as described below.  To EOB with min a x 1 and cues to continue to EOB.  Steady in sitting.  Stood with min a x 2 and took several steps to recliner at bedside.  Flexed posture and knees during transfer but overall improved.  Does not reach back for chair despite cues and encouragement to do so.   Follow Up Recommendations  SNF;Supervision/Assistance - 24 hour     Equipment Recommendations  None recommended by PT    Recommendations for Other Services       Precautions / Restrictions Precautions Precautions: Fall;Anterior Hip Restrictions Weight Bearing Restrictions: Yes LLE Weight Bearing: Weight bearing as tolerated    Mobility  Bed Mobility Overal bed mobility: Needs Assistance Bed Mobility: Supine to Sit     Supine to sit: Min assist          Transfers Overall transfer level: Needs assistance Equipment used: Rolling walker (2 wheeled) Transfers: Sit to/from Stand   Stand pivot transfers: Min assist;+2 safety/equipment          Ambulation/Gait Ambulation/Gait assistance: Min assist;+2 safety/equipment;+2 physical assistance Gait Distance (Feet): 3 Feet Assistive device: Rolling walker (2 wheeled) Gait Pattern/deviations: Decreased step length - left;Decreased step length - right;Decreased stride length;Decreased dorsiflexion - left;Decreased weight shift to left Gait velocity: decreased   General Gait Details: decreased LLE foot clearance, absent LLE heel  strike   Stairs             Wheelchair Mobility    Modified Rankin (Stroke Patients Only)       Balance Overall balance assessment: Needs assistance Sitting-balance support: No upper extremity supported;Feet supported Sitting balance-Leahy Scale: Good     Standing balance support: Bilateral upper extremity supported Standing balance-Leahy Scale: Poor Standing balance comment: requires BUE support on RW                            Cognition Arousal/Alertness: Awake/alert Behavior During Therapy: WFL for tasks assessed/performed Overall Cognitive Status: Within Functional Limits for tasks assessed                                        Exercises Other Exercises Other Exercises: LLE supine ex x 10 per protocol    General Comments        Pertinent Vitals/Pain Pain Assessment: 0-10 Pain Score: 7  Pain Location: L hip Pain Descriptors / Indicators: Sore Pain Intervention(s): Limited activity within patient's tolerance;RN gave pain meds during session;Monitored during session;Repositioned    Home Living                      Prior Function            PT Goals (current goals can now be found in the care plan section) Progress towards PT goals: Progressing toward goals  Frequency    BID      PT Plan Current plan remains appropriate    Co-evaluation              AM-PAC PT "6 Clicks" Mobility   Outcome Measure  Help needed turning from your back to your side while in a flat bed without using bedrails?: A Little Help needed moving from lying on your back to sitting on the side of a flat bed without using bedrails?: A Little Help needed moving to and from a bed to a chair (including a wheelchair)?: A Lot Help needed standing up from a chair using your arms (e.g., wheelchair or bedside chair)?: A Lot Help needed to walk in hospital room?: A Lot Help needed climbing 3-5 steps with a railing? : Total 6 Click  Score: 13    End of Session Equipment Utilized During Treatment: Gait belt Activity Tolerance: Patient tolerated treatment well;Patient limited by pain Patient left: in chair;with call bell/phone within reach;with chair alarm set;with family/visitor present   PT Visit Diagnosis: Unsteadiness on feet (R26.81);Muscle weakness (generalized) (M62.81);Difficulty in walking, not elsewhere classified (R26.2);Pain;Repeated falls (R29.6) Pain - Right/Left: Left Pain - part of body: Hip     Time: 8338-2505 PT Time Calculation (min) (ACUTE ONLY): 14 min  Charges:  $Therapeutic Exercise: 8-22 mins                    Danielle Dess, PTA 06/21/20, 10:20 AM

## 2020-06-21 NOTE — Progress Notes (Signed)
  Subjective: 2 Days Post-Op Procedure(s) (LRB): TOTAL HIP ARTHROPLASTY (Left) Patient reports pain as well-controlled.   Patient is well, and has had no acute complaints or problems Plan is to go Rehab after hospital stay. Negative for chest pain and shortness of breath Fever: no Gastrointestinal: negative for nausea and vomiting.   Patient has not had a bowel movement.  Objective: Vital signs in last 24 hours: Temp:  [97.5 F (36.4 C)-98.8 F (37.1 C)] 98.3 F (36.8 C) (03/13 0839) Pulse Rate:  [88-103] 92 (03/13 0839) Resp:  [17-18] 18 (03/13 0839) BP: (95-129)/(60-91) 121/91 (03/13 0839) SpO2:  [92 %-97 %] 92 % (03/13 0839)  Intake/Output from previous day:  Intake/Output Summary (Last 24 hours) at 06/21/2020 1027 Last data filed at 06/21/2020 8675 Gross per 24 hour  Intake 800 ml  Output 1350 ml  Net -550 ml    Intake/Output this shift: No intake/output data recorded.  Labs: Recent Labs    06/18/20 2055 06/18/20 2256 06/19/20 2029 06/20/20 0532  HGB 12.6* 13.2 12.4* 12.5*   Recent Labs    06/19/20 2029 06/20/20 0532  WBC 14.6* 11.7*  RBC 4.00* 3.90*  HCT 37.7* 35.5*  PLT 311 301   Recent Labs    06/18/20 2256 06/19/20 2029 06/20/20 0532  NA 138  --  133*  K 4.2  --  4.0  CL 104  --  104  CO2 27  --  21*  BUN 41*  --  19  CREATININE 1.03 0.84 0.87  GLUCOSE 103*  --  118*  CALCIUM 8.8*  --  8.3*   Recent Labs    06/18/20 2256  INR 1.0     EXAM General - Patient is Alert, Appropriate and Oriented Extremity - Neurovascular intact Dorsiflexion/Plantar flexion intact Compartment soft; no SCDs in room Dressing/Incision -Pravena in place and working, nothing in Barrister's clerk Function - intact, moving foot and toes well on exam.    Assessment/Plan: 2 Days Post-Op Procedure(s) (LRB): TOTAL HIP ARTHROPLASTY (Left) Active Problems:   Tobacco use   Femur fracture, left (HCC)   Alcohol abuse   Closed fracture of left hip (HCC)    Fall  Estimated body mass index is 16.92 kg/m as calculated from the following:   Height as of this encounter: 6\' 6"  (1.981 m).   Weight as of this encounter: 66.4 kg. Advance diet Up with therapy  Spoke with nursing regarding lack of SCDs.   DVT Prophylaxis - Lovenox, Ted hose and SCDs Weight-Bearing as tolerated to left leg  , PA-C [facility] Orthopaedic Surgery 06/21/2020, 10:27 AM

## 2020-06-22 ENCOUNTER — Encounter: Payer: Self-pay | Admitting: Orthopedic Surgery

## 2020-06-22 DIAGNOSIS — S72002A Fracture of unspecified part of neck of left femur, initial encounter for closed fracture: Secondary | ICD-10-CM | POA: Diagnosis not present

## 2020-06-22 DIAGNOSIS — Z720 Tobacco use: Secondary | ICD-10-CM | POA: Diagnosis not present

## 2020-06-22 DIAGNOSIS — F1010 Alcohol abuse, uncomplicated: Secondary | ICD-10-CM | POA: Diagnosis not present

## 2020-06-22 DIAGNOSIS — W19XXXA Unspecified fall, initial encounter: Secondary | ICD-10-CM | POA: Diagnosis not present

## 2020-06-22 MED ORDER — HYDROCODONE-ACETAMINOPHEN 5-325 MG PO TABS: 1.0000 | ORAL_TABLET | Freq: Four times a day (QID) | ORAL | 0 refills | Status: DC | PRN

## 2020-06-22 MED ORDER — ENOXAPARIN SODIUM 40 MG/0.4ML ~~LOC~~ SOLN: 40.0000 mg | SUBCUTANEOUS | 0 refills | Status: DC

## 2020-06-22 NOTE — Progress Notes (Signed)
PROGRESS NOTE    Charles Ray  QMG:[id number] DOB: 01-08-1951 DOA: 06/18/2020 PCP: Jerl Mina, MD   Brief Narrative: Taken from H&P. Charles Ray is a 70 y.o. male with medical history significant for depression, alcohol abuse, tobacco abuse, presents to the emergency department for chief concerns of left hip pain after having a mechanical fall.  Denies any presyncope or syncope.  Found to have displaced transcervical fracture of proximal left femur.  Orthopedic was consulted and he underwent successful left total hip replacement on 06/19/2020.  Subjective: Patient was sitting in chair when seen today.  Just finished working up with PT.  Stating that pain is bearable.  No other complaint.  Wife at bedside.  Assessment & Plan:   Active Problems:   Tobacco use   Femur fracture, left (HCC)   Alcohol abuse   Closed fracture of left hip (HCC)   Fall  Acute displaced transcervical fracture of proximal left femur.  Secondary to mechanical fall.  Underwent total hip replacement yesterday. -Continue with pain management. -Follow-up post surgical recommendations. -PT/OT evaluation -SNF placement -TOC consult -Postoperative hemoglobin at 12.5. -Patient will be discharged on Lovenox for 14 days as DVT prophylaxis.  Alcohol abuse.  Patient drinks half a pint daily.  Last drink was on 06/17/2020.  No sign of withdrawal at this time. -Continue with CIWA protocol  Tobacco abuse.  Smokes 1 pack/day -Continue with nicotine patch  History of depression.  No acute concern. -Continue with home dose of amitriptyline at night.  Objective: Vitals:   06/21/20 2101 06/22/20 0026 06/22/20 0339 06/22/20 0801  BP: 131/79 119/67 112/90 124/72  Pulse: 90 92 88 89  Resp: 17 20 17 16   Temp: 98.3 F (36.8 C) 98.6 F (37 C) 98 F (36.7 C) 98.3 F (36.8 C)  TempSrc: Oral     SpO2: 95% 93% 93% 93%  Weight:      Height:        Intake/Output Summary (Last 24 hours) at 06/22/2020 1401 Last  data filed at 06/22/2020 0027 Gross per 24 hour  Intake 360 ml  Output 100 ml  Net 260 ml   Filed Weights   06/18/20 1944 06/18/20 2305  Weight: 76.2 kg 66.4 kg    Examination:  General.  Well-developed elderly man, in no acute distress. Pulmonary.  Lungs clear bilaterally, normal respiratory effort. CV.  Regular rate and rhythm, no JVD, rub or murmur. Abdomen.  Soft, nontender, nondistended, BS positive. CNS.  Alert and oriented x3.  No focal neurologic deficit. Extremities.  No edema, no cyanosis, pulses intact and symmetrical. Psychiatry.  Judgment and insight appears normal.  DVT prophylaxis: Lovenox Code Status: Full Family Communication: Wife was updated at bedside Disposition Plan:  Status is: Inpatient  Remains inpatient appropriate because:Inpatient level of care appropriate due to severity of illness   Dispo: The patient is from: Home              Anticipated d/c is to: SNF              Patient currently is not medically stable to d/c.   Difficult to place patient No              Level of care: Med-Surg  All the records are reviewed and case discussed with Care Management/Social Worker. Management plans discussed with the patient, nursing and they are in agreement.  Consultants:   Orthopedic surgery  Procedures:  Antimicrobials:   Data Reviewed: I have personally reviewed following  labs and imaging studies  CBC: Recent Labs  Lab 06/18/20 2055 06/18/20 2256 06/19/20 2029 06/20/20 0532  WBC 12.2* 14.2* 14.6* 11.7*  NEUTROABS 9.6*  --   --   --   HGB 12.6* 13.2 12.4* 12.5*  HCT 37.9* 39.2 37.7* 35.5*  MCV 93.8 92.7 94.3 91.0  PLT 375 366 311 301   Basic Metabolic Panel: Recent Labs  Lab 06/18/20 2055 06/18/20 2256 06/19/20 2029 06/20/20 0532  NA 138 138  --  133*  K 4.6 4.2  --  4.0  CL 104 104  --  104  CO2 26 27  --  21*  GLUCOSE 93 103*  --  118*  BUN 40* 41*  --  19  CREATININE 1.02 1.03 0.84 0.87  CALCIUM 8.7* 8.8*  --  8.3*  MG   --  2.1  --   --   PHOS  --  2.9  --   --    GFR: Estimated Creatinine Clearance: 72.1 mL/min (by C-G formula based on SCr of 0.87 mg/dL). Liver Function Tests: Recent Labs  Lab 06/18/20 2256  AST 17  ALT 16  ALKPHOS 106  BILITOT 0.5  PROT 6.7  ALBUMIN 3.4*   No results for input(s): LIPASE, AMYLASE in the last 168 hours. No results for input(s): AMMONIA in the last 168 hours. Coagulation Profile: Recent Labs  Lab 06/18/20 2256  INR 1.0   Cardiac Enzymes: No results for input(s): CKTOTAL, CKMB, CKMBINDEX, TROPONINI in the last 168 hours. BNP (last 3 results) No results for input(s): PROBNP in the last 8760 hours. HbA1C: No results for input(s): HGBA1C in the last 72 hours. CBG: No results for input(s): GLUCAP in the last 168 hours. Lipid Profile: No results for input(s): CHOL, HDL, LDLCALC, TRIG, CHOLHDL, LDLDIRECT in the last 72 hours. Thyroid Function Tests: No results for input(s): TSH, T4TOTAL, FREET4, T3FREE, THYROIDAB in the last 72 hours. Anemia Panel: No results for input(s): VITAMINB12, FOLATE, FERRITIN, TIBC, IRON, RETICCTPCT in the last 72 hours. Sepsis Labs: No results for input(s): PROCALCITON, LATICACIDVEN in the last 168 hours.  Recent Results (from the past 240 hour(s))  Resp Panel by RT-PCR (Flu A&B, Covid) Nasopharyngeal Swab     Status: None   Collection Time: 06/18/20  8:55 PM   Specimen: Nasopharyngeal Swab; Nasopharyngeal(NP) swabs in vial transport medium  Result Value Ref Range Status   SARS Coronavirus 2 by RT PCR NEGATIVE NEGATIVE Final    Comment: (NOTE) SARS-CoV-2 target nucleic acids are NOT DETECTED.  The SARS-CoV-2 RNA is generally detectable in upper respiratory specimens during the acute phase of infection. The lowest concentration of SARS-CoV-2 viral copies this assay can detect is 138 copies/mL. A negative result does not preclude SARS-Cov-2 infection and should not be used as the sole basis for treatment or other patient  management decisions. A negative result may occur with  improper specimen collection/handling, submission of specimen other than nasopharyngeal swab, presence of viral mutation(s) within the areas targeted by this assay, and inadequate number of viral copies(<138 copies/mL). A negative result must be combined with clinical observations, patient history, and epidemiological information. The expected result is Negative.  Fact Sheet for Patients:  BloggerCourse.com  Fact Sheet for Healthcare Providers:  SeriousBroker.it  This test is no t yet approved or cleared by the [place] FDA and  has been authorized for detection and/or diagnosis of SARS-CoV-2 by FDA under an Emergency Use Authorization (EUA). This EUA will remain  in effect (meaning this test  can be used) for the duration of the COVID-19 declaration under Section 564(b)(1) of the Act, 21 U.S.C.section 360bbb-3(b)(1), unless the authorization is terminated  or revoked sooner.       Influenza A by PCR NEGATIVE NEGATIVE Final   Influenza B by PCR NEGATIVE NEGATIVE Final    Comment: (NOTE) The Xpert Xpress SARS-CoV-2/FLU/RSV plus assay is intended as an aid in the diagnosis of influenza from Nasopharyngeal swab specimens and should not be used as a sole basis for treatment. Nasal washings and aspirates are unacceptable for Xpert Xpress SARS-CoV-2/FLU/RSV testing.  Fact Sheet for Patients: BloggerCourse.com  Fact Sheet for Healthcare Providers: SeriousBroker.it  This test is not yet approved or cleared by the [place] FDA and has been authorized for detection and/or diagnosis of SARS-CoV-2 by FDA under an Emergency Use Authorization (EUA). This EUA will remain in effect (meaning this test can be used) for the duration of the COVID-19 declaration under Section 564(b)(1) of the Act, 21 U.S.C. section 360bbb-3(b)(1),  unless the authorization is terminated or revoked.  Performed at [facility], [address], [place], [place] [address]   Surgical PCR screen     Status: None   Collection Time: 06/19/20  5:14 AM   Specimen: Nasal Mucosa; Nasal Swab  Result Value Ref Range Status   MRSA, PCR NEGATIVE NEGATIVE Final   Staphylococcus aureus NEGATIVE NEGATIVE Final    Comment: (NOTE) The Xpert SA Assay (FDA approved for NASAL specimens in patients 5 years of age and older), is one component of a comprehensive surveillance program. It is not intended to diagnose infection nor to guide or monitor treatment. Performed at [facility], [address], [place], [place] [address]      Radiology Studies: No results found.  Scheduled Meds: . amitriptyline  300 mg Oral QHS  . docusate sodium  100 mg Oral BID  . enoxaparin (LOVENOX) injection  40 mg Subcutaneous Q24H  . folic acid  1 mg Oral Daily  . multivitamin with minerals  1 tablet Oral Daily  . pantoprazole  40 mg Oral Daily  . thiamine  100 mg Oral Daily   Or  . thiamine  100 mg Intravenous Daily   Continuous Infusions: . sodium chloride Stopped (06/20/20 0302)     LOS: 4 days   Time spent: 25 minutes. More than 50% of the time was spent in counseling/coordination of care  Arnetha Courser, MD [facility]  If 7PM-7AM, please contact night-coverage [email or web address]  06/22/2020, 2:01 PM   This record has been created using Conservation officer, historic buildings. Errors have been sought and corrected,but may not always be located. Such creation errors do not reflect on the standard of care.

## 2020-06-22 NOTE — NC FL2 (Signed)
[place] MEDICAID FL2 LEVEL OF CARE SCREENING TOOL     IDENTIFICATION  Patient Name: Charles Ray Birthdate: 1951-01-21 Sex: male Admission Date (Current Location): 06/18/2020  [place] and [place] Number:  Chiropodist and Address:  [facility], [address] [place], [place] [address]      Provider Number: [id number]  Attending Physician Name and Address:  Arnetha Courser, MD  Relative Name and Phone Number:       Current Level of Care: Hospital Recommended Level of Care: Skilled Nursing Facility Prior Approval Number:    Date Approved/Denied:   PASRR Number: Manual review  Discharge Plan: SNF    Current Diagnoses: Patient Active Problem List   Diagnosis Date Noted  . Closed fracture of left hip (HCC)   . Fall   . Femur fracture, left (HCC) 06/18/2020  . Alcohol abuse 06/18/2020  . Family history of colon cancer 07/05/2013  . Tests ordered 07/04/2012  . Hepatitis C 12/27/2011  . History of alcohol abuse 12/27/2011  . Tobacco use 12/27/2011  . Chronic pain 12/27/2011    Orientation RESPIRATION BLADDER Height & Weight     Self,Time,Situation,Place  Normal Continent Weight: 146 lb 6.2 oz (66.4 kg) Height:  6\' 6"  (198.1 cm)  BEHAVIORAL SYMPTOMS/MOOD NEUROLOGICAL BOWEL NUTRITION STATUS   (None)  (None) Continent Diet (Regular)  AMBULATORY STATUS COMMUNICATION OF NEEDS Skin   Limited Assist Verbally Wound Vac,Surgical wounds,Skin abrasions,Bruising,Other (Comment) (Contact dermatitis. Open wound on left posterior tibial (foam prn). Remove prevena wound vac (left thigh) on 3/22.)                       Personal Care Assistance Level of Assistance  Bathing,Feeding,Dressing Bathing Assistance: Maximum assistance Feeding assistance: Independent Dressing Assistance: Maximum assistance     Functional Limitations Info  Sight,Hearing,Speech Sight Info: Adequate Hearing Info: Adequate Speech Info: Adequate    SPECIAL CARE  FACTORS FREQUENCY  PT (By licensed PT),OT (By licensed OT)     PT Frequency: 5 x week OT Frequency: 5 x week            Contractures Contractures Info: Not present    Additional Factors Info  Code Status,Allergies Code Status Info: Full code Allergies Info: NKDA           Current Medications (06/22/2020):  This is the current hospital active medication list Current Facility-Administered Medications  Medication Dose Route Frequency Provider Last Rate Last Admin  . 0.9 %  sodium chloride infusion   Intravenous Continuous 06/24/2020, MD   Stopped at 06/20/20 0302  . albuterol (VENTOLIN HFA) 108 (90 Base) MCG/ACT inhaler 2 puff  2 puff Inhalation Q6H PRN 08/20/20, MD      . alum & mag hydroxide-simeth (MAALOX/MYLANTA) 200-200-20 MG/5ML suspension 30 mL  30 mL Oral Q4H PRN 10-18-2000, MD      . amitriptyline (ELAVIL) tablet 300 mg  300 mg Oral QHS Kennedy Bucker, MD   300 mg at 06/21/20 2107  . bisacodyl (DULCOLAX) suppository 10 mg  10 mg Rectal Daily PRN 2108, MD      . diphenhydrAMINE (BENADRYL) 12.5 MG/5ML elixir 12.5-25 mg  12.5-25 mg Oral Q4H PRN 05-11-1984, MD      . docusate sodium (COLACE) capsule 100 mg  100 mg Oral BID Kennedy Bucker, MD   100 mg at 06/22/20 0820  . enoxaparin (LOVENOX) injection 40 mg  40 mg Subcutaneous Q24H 06/24/20, MD   40  mg at 06/22/20 0821  . folic acid (FOLVITE) tablet 1 mg  1 mg Oral Daily Kennedy Bucker, MD   1 mg at 06/22/20 0820  . HYDROcodone-acetaminophen (NORCO/VICODIN) 5-325 MG per tablet 1-2 tablet  1-2 tablet Oral Q6H PRN Kennedy Bucker, MD   1 tablet at 06/22/20 [phone]  . magnesium citrate solution 1 Bottle  1 Bottle Oral Once PRN Kennedy Bucker, MD      . magnesium hydroxide (MILK OF MAGNESIA) suspension 30 mL  30 mL Oral Daily PRN Kennedy Bucker, MD   30 mL at 06/22/20 0842  . menthol-cetylpyridinium (CEPACOL) lozenge 3 mg  1 lozenge Oral PRN Kennedy Bucker, MD       Or  . phenol (CHLORASEPTIC) mouth spray 1 spray   1 spray Mouth/Throat PRN Kennedy Bucker, MD      . metoCLOPramide (REGLAN) tablet 5-10 mg  5-10 mg Oral Q8H PRN Kennedy Bucker, MD       Or  . metoCLOPramide (REGLAN) injection 5-10 mg  5-10 mg Intravenous Q8H PRN Kennedy Bucker, MD      . morphine 2 MG/ML injection 0.5 mg  0.5 mg Intravenous Q2H PRN Kennedy Bucker, MD   0.5 mg at 06/19/20 1217  . multivitamin with minerals tablet 1 tablet  1 tablet Oral Daily Kennedy Bucker, MD   1 tablet at 06/22/20 0820  . nicotine (NICODERM CQ - dosed in mg/24 hours) patch 21 mg  21 mg Transdermal Daily PRN Kennedy Bucker, MD      . ondansetron [facility]) tablet 4 mg  4 mg Oral Q6H PRN Kennedy Bucker, MD       Or  . ondansetron [facility]) injection 4 mg  4 mg Intravenous Q6H PRN Kennedy Bucker, MD      . pantoprazole (PROTONIX) EC tablet 40 mg  40 mg Oral Daily Kennedy Bucker, MD   40 mg at 06/22/20 0820  . polyethylene glycol (MIRALAX / GLYCOLAX) packet 17 g  17 g Oral Daily PRN Kennedy Bucker, MD      . thiamine tablet 100 mg  100 mg Oral Daily Kennedy Bucker, MD   100 mg at 06/22/20 7106   Or  . thiamine (B-1) injection 100 mg  100 mg Intravenous Daily Kennedy Bucker, MD   100 mg at 06/21/20 2694     Discharge Medications: Please see discharge summary for a list of discharge medications.  Relevant Imaging Results:  Relevant Lab Results:   Additional Information SS#: [id number]  Margarito Liner, LCSW

## 2020-06-22 NOTE — Progress Notes (Signed)
  Subjective: 3 Days Post-Op Procedure(s) (LRB): TOTAL HIP ARTHROPLASTY (Left) Patient reports pain as mild.   Patient is well, and has had no acute complaints or problems Plan is to go Rehab after hospital stay. Negative for chest pain and shortness of breath Fever: no Gastrointestinal: negative for nausea and vomiting.   Patient has not had a bowel movement.  Objective: Vital signs in last 24 hours: Temp:  [97.6 F (36.4 C)-98.6 F (37 C)] 98.3 F (36.8 C) (03/14 0801) Pulse Rate:  [85-92] 89 (03/14 0801) Resp:  [16-20] 16 (03/14 0801) BP: (112-135)/(67-91) 124/72 (03/14 0801) SpO2:  [92 %-98 %] 93 % (03/14 0801)  Intake/Output from previous day:  Intake/Output Summary (Last 24 hours) at 06/22/2020 0808 Last data filed at 06/22/2020 0027 Gross per 24 hour  Intake 600 ml  Output 100 ml  Net 500 ml    Intake/Output this shift: No intake/output data recorded.  Labs: Recent Labs    06/19/20 2029 06/20/20 0532  HGB 12.4* 12.5*   Recent Labs    06/19/20 2029 06/20/20 0532  WBC 14.6* 11.7*  RBC 4.00* 3.90*  HCT 37.7* 35.5*  PLT 311 301   Recent Labs    06/19/20 2029 06/20/20 0532  NA  --  133*  K  --  4.0  CL  --  104  CO2  --  21*  BUN  --  19  CREATININE 0.84 0.87  GLUCOSE  --  118*  CALCIUM  --  8.3*   No results for input(s): LABPT, INR in the last 72 hours.   EXAM General - Patient is Alert, Appropriate and Oriented Extremity - Neurovascular intact Dorsiflexion/Plantar flexion intact Compartment soft;  Dressing/Incision -Pravena in place and working, no drainage Motor Function - intact, moving foot and toes well on exam.    Assessment/Plan: 3 Days Post-Op Procedure(s) (LRB): TOTAL HIP ARTHROPLASTY (Left) Active Problems:   Tobacco use   Femur fracture, left (HCC)   Alcohol abuse   Closed fracture of left hip (HCC)   Fall  Estimated body mass index is 16.92 kg/m as calculated from the following:   Height as of this encounter: 6\' 6"   (1.981 m).   Weight as of this encounter: 66.4 kg. Advance diet Up with therapy Work on and VSS Pain well controlled CM to assist with discharge to SNF  Lovenox 40 mg subcu daily x14 days at discharge TED hose bilateral lower extremity x6 weeks Please remove provena negative pressure dressing on 06/30/2020 and apply honey comb dressing. Keep dressing clean and dry at all times.  DVT Prophylaxis - Lovenox, Ted hose and SCDs Weight-Bearing as tolerated to left leg  07/02/2020, PA-C [facility] Orthopaedic Surgery 06/22/2020, 8:08 AM

## 2020-06-22 NOTE — Clinical Social Work Note (Signed)
RE: Charles Ray Date of Birth: January 29, 1951 Date: 06/22/2020   To Whom It May Concern:  Please be advised that the above-named patient will require a short-term nursing home stay - anticipated 30 days or less for rehabilitation and strengthening.  The plan is for return home.

## 2020-06-22 NOTE — Progress Notes (Signed)
Physical Therapy Treatment Patient Details Name: Charles Ray MRN: [id number] DOB: 01-30-1951 Today's Date: 06/22/2020    History of Present Illness Pt is a 70 y/o M admitted on 06/18/20 with c/c of L hip pain following a fall after tripping on the sidewalk. 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 Rosita Kea on 06/19/20. Pt is now WBAT LLE. PMH: depression, alcohol abuse, tobacco abuse    PT Comments    Participated in exercises as described below.  To EOB with min a x 1 and cues for sequencing and hand placements.  Steady in sitting.  Stood from raised bed with mod a x 1 and poor hand placements.  Pulls up on walker despite cues and generally unsteady in transitions.  Once standing, is able to gain balance for standing ex/wight shifting.  Stands x 2 from bed for standing ex/tolerance and transfers to recliner at bedside with min a x 1.  Pt with slow progression of gait more than transfers thus far.  He fatigues quickly and seems to self limit at times.  Incentive spirometer given to pt per MD request and taught to use per protocol.   Follow Up Recommendations  SNF;Supervision/Assistance - 24 hour     Equipment Recommendations  None recommended by PT    Recommendations for Other Services       Precautions / Restrictions Precautions Precautions: Fall;Anterior Hip Restrictions Weight Bearing Restrictions: Yes LLE Weight Bearing: Weight bearing as tolerated    Mobility  Bed Mobility Overal bed mobility: Needs Assistance Bed Mobility: Supine to Sit     Supine to sit: Min assist          Transfers Overall transfer level: Needs assistance Equipment used: Rolling walker (2 wheeled) Transfers: Sit to/from Stand Sit to Stand: Mod assist            Ambulation/Gait Ambulation/Gait assistance: Min assist Gait Distance (Feet): 3 Feet Assistive device: Rolling walker (2 wheeled) Gait  Pattern/deviations: Decreased step length - left;Decreased step length - right;Decreased stride length;Decreased dorsiflexion - left;Decreased weight shift to left Gait velocity: decreased   General Gait Details: decreased LLE foot clearance, absent LLE heel strike   Stairs             Wheelchair Mobility    Modified Rankin (Stroke Patients Only)       Balance Overall balance assessment: Needs assistance Sitting-balance support: No upper extremity supported;Feet supported Sitting balance-Leahy Scale: Good     Standing balance support: Bilateral upper extremity supported Standing balance-Leahy Scale: Poor Standing balance comment: requires BUE support on RW                            Cognition Arousal/Alertness: Awake/alert Behavior During Therapy: WFL for tasks assessed/performed Overall Cognitive Status: Within Functional Limits for tasks assessed                                        Exercises Other Exercises Other Exercises: LLE supine ex x 10 per protocol Other Exercises: standing weight shifting, marching and SLR x 10    General Comments        Pertinent Vitals/Pain Pain Assessment: Faces Faces Pain Scale: Hurts little more Pain Location: L hip Pain Descriptors / Indicators: Sore Pain Intervention(s): Limited activity within patient's tolerance;Monitored  during session;Repositioned    Home Living                      Prior Function            PT Goals (current goals can now be found in the care plan section) Progress towards PT goals: Progressing toward goals    Frequency    BID      PT Plan Current plan remains appropriate    Co-evaluation              AM-PAC PT "6 Clicks" Mobility   Outcome Measure  Help needed turning from your back to your side while in a flat bed without using bedrails?: A Little Help needed moving from lying on your back to sitting on the side of a flat bed without  using bedrails?: A Little Help needed moving to and from a bed to a chair (including a wheelchair)?: A Little Help needed standing up from a chair using your arms (e.g., wheelchair or bedside chair)?: A Lot Help needed to walk in hospital room?: A Lot Help needed climbing 3-5 steps with a railing? : Total 6 Click Score: 14    End of Session Equipment Utilized During Treatment: Gait belt Activity Tolerance: Patient tolerated treatment well;Patient limited by pain Patient left: in chair;with call bell/phone within reach;with chair alarm set;with family/visitor present   PT Visit Diagnosis: Unsteadiness on feet (R26.81);Muscle weakness (generalized) (M62.81);Difficulty in walking, not elsewhere classified (R26.2);Pain;Repeated falls (R29.6) Pain - Right/Left: Left Pain - part of body: Hip     Time: 4782-9562 PT Time Calculation (min) (ACUTE ONLY): 19 min  Charges:  $Gait Training: 8-22 mins $Therapeutic Activity: 8-22 mins                    Danielle Dess, PTA 06/22/20, 10:48 AM

## 2020-06-23 DIAGNOSIS — Z720 Tobacco use: Secondary | ICD-10-CM | POA: Diagnosis not present

## 2020-06-23 DIAGNOSIS — S72002A Fracture of unspecified part of neck of left femur, initial encounter for closed fracture: Secondary | ICD-10-CM | POA: Diagnosis not present

## 2020-06-23 DIAGNOSIS — F1010 Alcohol abuse, uncomplicated: Secondary | ICD-10-CM | POA: Diagnosis not present

## 2020-06-23 DIAGNOSIS — W19XXXA Unspecified fall, initial encounter: Secondary | ICD-10-CM | POA: Diagnosis not present

## 2020-06-23 LAB — BASIC METABOLIC PANEL
Anion gap: 6 (ref 5–15)
BUN: 36 mg/dL — ABNORMAL HIGH (ref 8–23)
CO2: 26 mmol/L (ref 22–32)
Calcium: 8.4 mg/dL — ABNORMAL LOW (ref 8.9–10.3)
Chloride: 105 mmol/L (ref 98–111)
Creatinine, Ser: 0.91 mg/dL (ref 0.61–1.24)
GFR, Estimated: >60 mL/min (ref >60)
Glucose, Bld: 100 mg/dL — ABNORMAL HIGH (ref 70–99)
Potassium: 4.6 mmol/L (ref 3.5–5.1)
Sodium: 137 mmol/L (ref 135–145)

## 2020-06-23 LAB — CBC
HCT: 36.2 % — ABNORMAL LOW (ref 39.0–52.0)
Hemoglobin: 12.5 g/dL — ABNORMAL LOW (ref 13.0–17.0)
MCH: 31.6 pg (ref 26.0–34.0)
MCHC: 34.5 g/dL (ref 30.0–36.0)
MCV: 91.4 fL (ref 80.0–100.0)
Platelets: 382 10*3/uL (ref 150–400)
RBC: 3.96 MIL/uL — ABNORMAL LOW (ref 4.22–5.81)
RDW: 14 % (ref 11.5–15.5)
WBC: 10.6 10*3/uL — ABNORMAL HIGH (ref 4.0–10.5)
nRBC: 0 % (ref 0.0–0.2)

## 2020-06-23 LAB — SURGICAL PATHOLOGY

## 2020-06-23 MED ORDER — BISACODYL 10 MG RE SUPP
10.0000 mg | Freq: Once | RECTAL | Status: DC
  Filled 2020-06-23 (×2): qty 1

## 2020-06-23 MED ORDER — MAGNESIUM HYDROXIDE 400 MG/5ML PO SUSP
30.0000 mL | Freq: Once | ORAL | Status: AC
  Administered 2020-06-23: 30 mL via ORAL
  Filled 2020-06-23: qty 30

## 2020-06-23 NOTE — TOC Progression Note (Signed)
Transition of Care [facility]) - Progression Note    Patient Details  Name: Charles Ray MRN: [id number] Date of Birth: 1950-05-24  Transition of Care [facility]) CM/SW Contact  Caryn Section, RN Phone Number: 06/23/2020, 9:09 AM  Clinical Narrative:   [facility] uploaded requested documents to [place] [facility] for PASRR review.  Awaiting response.         Expected Discharge Plan and Services                                                 Social Determinants of Health (SDOH) Interventions    Readmission Risk Interventions No flowsheet data found.

## 2020-06-23 NOTE — TOC Progression Note (Signed)
Transition of Care [facility]) - Progression Note    Patient Details  Name: Charles Ray MRN: [id number] Date of Birth: December 10, 1950  [facility] of [facility]) CM/SW Contact  Caryn Section, RN Phone Number: 06/23/2020, 4:55 PM  Clinical Narrative:   TOC in to see patient and wife at bedside.  Medicare list with facility comparison given to patient and family, with notification of bed offers.  State they would like to discuss overnight and advise TOC in the morning.  PASRR still pending, in level 2 review.  No further questions or concerns from patient and family, TOC contact information given, TOC to follow through discharge.         Expected Discharge Plan and Services                                                 Social Determinants of Health (SDOH) Interventions    Readmission Risk Interventions No flowsheet data found.

## 2020-06-23 NOTE — TOC Progression Note (Signed)
Transition of Care [facility]) - Progression Note    Patient Details  Name: Charles Ray MRN: [id number] Date of Birth: [birth year]  Transition of Care [facility]) CM/SW Contact  Caryn Section, RN Phone Number: 06/23/2020, 10:01 AM  Clinical Narrative:   TOC notifed that PASRR to Level 2 review.  TOC will continue to follow        Expected Discharge Plan and Services                                                 Social Determinants of Health (SDOH) Interventions    Readmission Risk Interventions No flowsheet data found.

## 2020-06-23 NOTE — Progress Notes (Signed)
PT Cancellation Note  Patient Details Name: Charles Ray MRN: [id number] DOB: [birth year]   Cancelled Treatment:    Reason Eval/Treat Not Completed: Patient at procedure or test/unavailable. Patient currently working with OT in the room. PT will continue with attempts as patient is available.   Donna Bernard, PT, MPT  Ina Homes 06/23/2020, 3:59 PM

## 2020-06-23 NOTE — Progress Notes (Signed)
Physical Therapy Treatment Patient Details Name: Charles Ray MRN: [id number] DOB: Nov 05, 1950 Today's Date: 06/23/2020    History of Present Illness Pt is a 70 y/o M admitted on 06/18/20 with c/c of L hip pain following a fall after tripping on the sidewalk. 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 Rosita Kea on 06/19/20. Pt is now WBAT LLE. PMH: depression, alcohol abuse, tobacco abuse    PT Comments    Ready.  To EOB with increased time cues and min a x 1.  Steady in sitting.  He continues to require mod a x 1 to stand from elevated bed and pulls up on walker despite verbal and tactile cues along with education.  Struggles to obtain upright position.  Continues with heavy forward lean on walker.  Standing ex and weight shifting activities then seated rest on bed before standing again to transfer to recliner at bedside.  Steps remain hesitant and he sits soon after turning due to fatigue.  Needs in place after transfer.  SNF remains appropriate as he continues to struggle with safe transfers, gait and general mobility.   Follow Up Recommendations  SNF;Supervision/Assistance - 24 hour     Equipment Recommendations  None recommended by PT    Recommendations for Other Services       Precautions / Restrictions Precautions Precautions: Fall;Anterior Hip Restrictions Weight Bearing Restrictions: Yes LLE Weight Bearing: Weight bearing as tolerated    Mobility  Bed Mobility Overal bed mobility: Needs Assistance Bed Mobility: Supine to Sit     Supine to sit: Min assist          Transfers Overall transfer level: Needs assistance Equipment used: Rolling walker (2 wheeled) Transfers: Sit to/from Stand Sit to Stand: Mod assist         General transfer comment: max cuing for safe hand placement/to push to standing  Ambulation/Gait Ambulation/Gait assistance: Min assist Gait Distance (Feet): 5  Feet Assistive device: Rolling walker (2 wheeled) Gait Pattern/deviations: Decreased step length - left;Decreased step length - right;Decreased stride length;Decreased dorsiflexion - left;Decreased weight shift to left Gait velocity: decreased   General Gait Details: small hesitant steps to recliner, significant forward lean and walker use for support.   Stairs             Wheelchair Mobility    Modified Rankin (Stroke Patients Only)       Balance Overall balance assessment: Needs assistance Sitting-balance support: No upper extremity supported;Feet supported Sitting balance-Leahy Scale: Good     Standing balance support: Bilateral upper extremity supported Standing balance-Leahy Scale: Poor Standing balance comment: requires BUE support on RW                            Cognition Arousal/Alertness: Awake/alert Behavior During Therapy: WFL for tasks assessed/performed Overall Cognitive Status: Within Functional Limits for tasks assessed                                        Exercises Other Exercises Other Exercises: standing weight shifting, marching and SLR x 10    General Comments        Pertinent Vitals/Pain Pain Assessment: Faces Faces Pain Scale: Hurts little more Pain Location: L hip Pain Descriptors / Indicators: Sore Pain Intervention(s): Limited activity within patient's tolerance;Monitored during  session;Repositioned    Home Living                      Prior Function            PT Goals (current goals can now be found in the care plan section) Progress towards PT goals: Progressing toward goals    Frequency    BID      PT Plan Current plan remains appropriate    Co-evaluation              AM-PAC PT "6 Clicks" Mobility   Outcome Measure  Help needed turning from your back to your side while in a flat bed without using bedrails?: A Little Help needed moving from lying on your back to  sitting on the side of a flat bed without using bedrails?: A Little Help needed moving to and from a bed to a chair (including a wheelchair)?: A Little Help needed standing up from a chair using your arms (e.g., wheelchair or bedside chair)?: A Lot Help needed to walk in hospital room?: A Lot Help needed climbing 3-5 steps with a railing? : Total 6 Click Score: 14    End of Session Equipment Utilized During Treatment: Gait belt Activity Tolerance: Patient tolerated treatment well;Patient limited by pain Patient left: in chair;with call bell/phone within reach;with chair alarm set;with family/visitor present   PT Visit Diagnosis: Unsteadiness on feet (R26.81);Muscle weakness (generalized) (M62.81);Difficulty in walking, not elsewhere classified (R26.2);Pain;Repeated falls (R29.6) Pain - Right/Left: Left Pain - part of body: Hip     Time: 0737-1062 PT Time Calculation (min) (ACUTE ONLY): 13 min  Charges:  $Therapeutic Activity: 8-22 mins                    Danielle Dess, PTA 06/23/20, 9:25 AM

## 2020-06-23 NOTE — Progress Notes (Signed)
Occupational Therapy Treatment Patient Details Name: ATHAN CASALINO MRN: [id number] DOB: [birth year] Today's Date: 06/23/2020    History of present illness Pt is a 70 y/o M admitted on 06/18/20 with c/c of L hip pain following a fall after tripping on the sidewalk. 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 Rosita Kea on 06/19/20. Pt is now WBAT LLE. PMH: depression, alcohol abuse, tobacco abuse   OT comments  Pt presents today with generalized weakness and reduced endurance, limiting his ability to engage in functional mobility tasks. He reports 5/10 hip pain in supine, 7/10 with mobility. Pt made good effort, but has difficult time with hand placement on and positioning with RW, despite repeated cueing and visual demonstration. Standing balance remains poor, and pt finds transfers challenging, requiring Mod A +2. Recommend ongoing OT while hospitalized, followed by DC to SNF for STR.    Follow Up Recommendations  SNF    Equipment Recommendations  Tub/shower seat;3 in 1 bedside commode    Recommendations for Other Services      Precautions / Restrictions Precautions Precautions: Fall;Anterior Hip Precaution Comments: hip (anterior approach) Restrictions Weight Bearing Restrictions: Yes       Mobility Bed Mobility Overal bed mobility: Needs Assistance Bed Mobility: Supine to Sit     Supine to sit: Min assist          Transfers Overall transfer level: Needs assistance Equipment used: Rolling walker (2 wheeled) Transfers: Sit to/from Stand Sit to Stand: Mod assist Stand pivot transfers: Mod assist;+2 safety/equipment       General transfer comment: max cueing for safe hand placement on RW, pt unable to follow cues    Balance Overall balance assessment: Needs assistance Sitting-balance support: No upper extremity supported;Feet supported Sitting balance-Leahy Scale: Good     Standing balance  support: Bilateral upper extremity supported Standing balance-Leahy Scale: Poor Standing balance comment: requires BUE support on RW, but has difficulty holding walker in correct position                           ADL either performed or assessed with clinical judgement   ADL Overall ADL's : Needs assistance/impaired                     Lower Body Dressing: Maximal assistance                       Vision Baseline Vision/History: Wears glasses Wears Glasses: At all times Patient Visual Report: No change from baseline     Perception     Praxis      Cognition Arousal/Alertness: Awake/alert Behavior During Therapy: WFL for tasks assessed/performed;Flat affect Overall Cognitive Status: Within Functional Limits for tasks assessed                                 General Comments: A&O x 4, flat affect        Exercises Total Joint Exercises Ankle Circles/Pumps: AROM;10 reps;Both;Seated Other Exercises Other Exercises: sit<>stand x 3, with VCs for safe technique   Shoulder Instructions       General Comments      Pertinent Vitals/ Pain       Pain Score: 7  Pain Location: L hip Pain Intervention(s): Limited activity within patient's tolerance;Monitored during session;Repositioned  Home  Living                                          Prior Functioning/Environment              Frequency  Min 1X/week        Progress Toward Goals  OT Goals(current goals can now be found in the care plan section)  Progress towards OT goals: Progressing toward goals  Acute Rehab OT Goals Patient Stated Goal: go home OT Goal Formulation: With patient Time For Goal Achievement: 07/04/20 Potential to Achieve Goals: Good  Plan Discharge plan remains appropriate;Frequency remains appropriate    Co-evaluation                 AM-PAC OT "6 Clicks" Daily Activity     Outcome Measure   Help from another person  eating meals?: None Help from another person taking care of personal grooming?: A Little Help from another person toileting, which includes using toliet, bedpan, or urinal?: A Lot Help from another person bathing (including washing, rinsing, drying)?: A Lot Help from another person to put on and taking off regular upper body clothing?: A Little Help from another person to put on and taking off regular lower body clothing?: A Lot 6 Click Score: 16    End of Session Equipment Utilized During Treatment: Rolling walker  OT Visit Diagnosis: Unsteadiness on feet (R26.81);Other abnormalities of gait and mobility (R26.89);Muscle weakness (generalized) (M62.81);History of falling (Z91.81);Pain Pain - Right/Left: Left Pain - part of body: Hip   Activity Tolerance Patient tolerated treatment well   Patient Left in chair;with call bell/phone within reach;with chair alarm set;with family/visitor present   Nurse Communication          Time: 1550-1605 OT Time Calculation (min): 15 min  Charges: OT General Charges $OT Visit: 1 Visit OT Treatments $Self Care/Home Management : 8-22 mins  Latina Craver, PhD, MS, OTR/L 06/23/20, 4:28 PM

## 2020-06-23 NOTE — Progress Notes (Signed)
PROGRESS NOTE    Charles Ray  ZES:[id number] DOB: [birth year] DOA: 06/18/2020 PCP: Jerl Mina, MD   Brief Narrative: Taken from H&P. Charles Ray is a 70 y.o. male with medical history significant for depression, alcohol abuse, tobacco abuse, presents to the emergency department for chief concerns of left hip pain after having a mechanical fall.  Denies any presyncope or syncope.  Found to have displaced transcervical fracture of proximal left femur.  Orthopedic was consulted and he underwent successful left total hip replacement on 06/19/2020.  Plan is to go to SNF before returning home-pending bed and insurance authorization.  Subjective: Patient seems improving, pain is well controlled.  Did not had bowel movement for the past couple of days.  Wife at bedside.  Assessment & Plan:   Active Problems:   Tobacco use   Femur fracture, left (HCC)   Alcohol abuse   Closed fracture of left hip (HCC)   Fall  Acute displaced transcervical fracture of proximal left femur.  Secondary to mechanical fall.  Underwent total hip replacement yesterday. -Continue with pain management. -Follow-up post surgical recommendations. -PT/OT evaluation -SNF placement -TOC consult-pending bed offer and insurance authorization. -Postoperative hemoglobin at 12.5. -Patient will be discharged on Lovenox for 14 days as DVT prophylaxis.  Alcohol abuse.  Patient drinks half a pint daily.  Last drink was on 06/17/2020.  No sign of withdrawal at this time. -Continue with CIWA protocol  Tobacco abuse.  Smokes 1 pack/day -Continue with nicotine patch  History of depression.  No acute concern. -Continue with home dose of amitriptyline at night.  Objective: Vitals:   06/22/20 1623 06/22/20 2209 06/23/20 0328 06/23/20 1144  BP: 119/71 115/74 134/77 125/79  Pulse: 89 86 78 83  Resp: 18 17 18 18   Temp: 99.3 F (37.4 C) 99 F (37.2 C) (!) 97.4 F (36.3 C) 98.4 F (36.9 C)  TempSrc:  Oral Oral    SpO2: 95% 95% 94% 93%  Weight:      Height:        Intake/Output Summary (Last 24 hours) at 06/23/2020 1358 Last data filed at 06/22/2020 2100 Gross per 24 hour  Intake 240 ml  Output --  Net 240 ml   Filed Weights   06/18/20 1944 06/18/20 2305  Weight: 76.2 kg 66.4 kg    Examination:  General.  Well-developed gentleman, in no acute distress. Pulmonary.  Lungs clear bilaterally, normal respiratory effort. CV.  Regular rate and rhythm, no JVD, rub or murmur. Abdomen.  Soft, nontender, nondistended, BS positive. CNS.  Alert and oriented x3.  No focal neurologic deficit. Extremities.  No edema, no cyanosis, pulses intact and symmetrical. Psychiatry.  Judgment and insight appears normal.  DVT prophylaxis: Lovenox Code Status: Full Family Communication: Wife was updated at bedside Disposition Plan:  Status is: Inpatient  Remains inpatient appropriate because:Inpatient level of care appropriate due to severity of illness   Dispo: The patient is from: Home              Anticipated d/c is to: SNF              Patient currently is medically stable.   Difficult to place patient No              Level of care: Med-Surg  All the records are reviewed and case discussed with Care Management/Social Worker. Management plans discussed with the patient, nursing and they are in agreement.  Consultants:   Orthopedic surgery  Procedures:  Antimicrobials:  Data Reviewed: I have personally reviewed following labs and imaging studies  CBC: Recent Labs  Lab 06/18/20 2055 06/18/20 2256 06/19/20 2029 06/20/20 0532 06/23/20 0532  WBC 12.2* 14.2* 14.6* 11.7* 10.6*  NEUTROABS 9.6*  --   --   --   --   HGB 12.6* 13.2 12.4* 12.5* 12.5*  HCT 37.9* 39.2 37.7* 35.5* 36.2*  MCV 93.8 92.7 94.3 91.0 91.4  PLT 375 366 311 301 382   Basic Metabolic Panel: Recent Labs  Lab 06/18/20 2055 06/18/20 2256 06/19/20 2029 06/20/20 0532 06/23/20 0532  NA 138 138  --  133* 137  K 4.6 4.2   --  4.0 4.6  CL 104 104  --  104 105  CO2 26 27  --  21* 26  GLUCOSE 93 103*  --  118* 100*  BUN 40* 41*  --  19 36*  CREATININE 1.02 1.03 0.84 0.87 0.91  CALCIUM 8.7* 8.8*  --  8.3* 8.4*  MG  --  2.1  --   --   --   PHOS  --  2.9  --   --   --    GFR: Estimated Creatinine Clearance: 68.9 mL/min (by C-G formula based on SCr of 0.91 mg/dL). Liver Function Tests: Recent Labs  Lab 06/18/20 2256  AST 17  ALT 16  ALKPHOS 106  BILITOT 0.5  PROT 6.7  ALBUMIN 3.4*   No results for input(s): LIPASE, AMYLASE in the last 168 hours. No results for input(s): AMMONIA in the last 168 hours. Coagulation Profile: Recent Labs  Lab 06/18/20 2256  INR 1.0   Cardiac Enzymes: No results for input(s): CKTOTAL, CKMB, CKMBINDEX, TROPONINI in the last 168 hours. BNP (last 3 results) No results for input(s): PROBNP in the last 8760 hours. HbA1C: No results for input(s): HGBA1C in the last 72 hours. CBG: No results for input(s): GLUCAP in the last 168 hours. Lipid Profile: No results for input(s): CHOL, HDL, LDLCALC, TRIG, CHOLHDL, LDLDIRECT in the last 72 hours. Thyroid Function Tests: No results for input(s): TSH, T4TOTAL, FREET4, T3FREE, THYROIDAB in the last 72 hours. Anemia Panel: No results for input(s): VITAMINB12, FOLATE, FERRITIN, TIBC, IRON, RETICCTPCT in the last 72 hours. Sepsis Labs: No results for input(s): PROCALCITON, LATICACIDVEN in the last 168 hours.  Recent Results (from the past 240 hour(s))  Resp Panel by RT-PCR (Flu A&B, Covid) Nasopharyngeal Swab     Status: None   Collection Time: 06/18/20  8:55 PM   Specimen: Nasopharyngeal Swab; Nasopharyngeal(NP) swabs in vial transport medium  Result Value Ref Range Status   SARS Coronavirus 2 by RT PCR NEGATIVE NEGATIVE Final    Comment: (NOTE) SARS-CoV-2 target nucleic acids are NOT DETECTED.  The SARS-CoV-2 RNA is generally detectable in upper respiratory specimens during the acute phase of infection. The  lowest concentration of SARS-CoV-2 viral copies this assay can detect is 138 copies/mL. A negative result does not preclude SARS-Cov-2 infection and should not be used as the sole basis for treatment or other patient management decisions. A negative result may occur with  improper specimen collection/handling, submission of specimen other than nasopharyngeal swab, presence of viral mutation(s) within the areas targeted by this assay, and inadequate number of viral copies(<138 copies/mL). A negative result must be combined with clinical observations, patient history, and epidemiological information. The expected result is Negative.  Fact Sheet for Patients:  BloggerCourse.com  Fact Sheet for Healthcare Providers:  SeriousBroker.it  This test is no t yet approved or cleared by  the [employer] and  has been authorized for detection and/or diagnosis of SARS-CoV-2 by FDA under an Emergency Use Authorization (EUA). This EUA will remain  in effect (meaning this test can be used) for the duration of the COVID-19 declaration under Section 564(b)(1) of the Act, 21 U.S.C.section 360bbb-3(b)(1), unless the authorization is terminated  or revoked sooner.       Influenza A by PCR NEGATIVE NEGATIVE Final   Influenza B by PCR NEGATIVE NEGATIVE Final    Comment: (NOTE) The Xpert Xpress SARS-CoV-2/FLU/RSV plus assay is intended as an aid in the diagnosis of influenza from Nasopharyngeal swab specimens and should not be used as a sole basis for treatment. Nasal washings and aspirates are unacceptable for Xpert Xpress SARS-CoV-2/FLU/RSV testing.  Fact Sheet for Patients: BloggerCourse.com  Fact Sheet for Healthcare Providers: SeriousBroker.it  This test is not yet approved or cleared by the [place] FDA and has been authorized for detection and/or diagnosis of SARS-CoV-2 by FDA under  an Emergency Use Authorization (EUA). This EUA will remain in effect (meaning this test can be used) for the duration of the COVID-19 declaration under Section 564(b)(1) of the Act, 21 U.S.C. section 360bbb-3(b)(1), unless the authorization is terminated or revoked.  Performed at [facility], [address], [place], [place] [address]   Surgical PCR screen     Status: None   Collection Time: 06/19/20  5:14 AM   Specimen: Nasal Mucosa; Nasal Swab  Result Value Ref Range Status   MRSA, PCR NEGATIVE NEGATIVE Final   Staphylococcus aureus NEGATIVE NEGATIVE Final    Comment: (NOTE) The Xpert SA Assay (FDA approved for NASAL specimens in patients 77 years of age and older), is one component of a comprehensive surveillance program. It is not intended to diagnose infection nor to guide or monitor treatment. Performed at [facility], [address], [place], [place] [address]      Radiology Studies: No results found.  Scheduled Meds: . amitriptyline  300 mg Oral QHS  . bisacodyl  10 mg Rectal Once  . docusate sodium  100 mg Oral BID  . enoxaparin (LOVENOX) injection  40 mg Subcutaneous Q24H  . folic acid  1 mg Oral Daily  . multivitamin with minerals  1 tablet Oral Daily  . pantoprazole  40 mg Oral Daily  . thiamine  100 mg Oral Daily   Or  . thiamine  100 mg Intravenous Daily   Continuous Infusions: . sodium chloride Stopped (06/20/20 0302)     LOS: 5 days   Time spent: 25 minutes. More than 50% of the time was spent in counseling/coordination of care  Arnetha Courser, MD [facility]  If 7PM-7AM, please contact night-coverage [email or web address]  06/23/2020, 1:58 PM   This record has been created using Conservation officer, historic buildings. Errors have been sought and corrected,but may not always be located. Such creation errors do not reflect on the standard of care.

## 2020-06-23 NOTE — Progress Notes (Signed)
  Subjective: 4 Days Post-Op Procedure(s) (LRB): TOTAL HIP ARTHROPLASTY (Left) Patient reports pain as mild.   Patient is well, and has had no acute complaints or problems Plan is to go Rehab after hospital stay. Negative for chest pain and shortness of breath Fever: no Gastrointestinal: negative for nausea and vomiting.   Patient has not had a bowel movement.  Objective: Vital signs in last 24 hours: Temp:  [97.4 F (36.3 C)-99.3 F (37.4 C)] 97.4 F (36.3 C) (03/15 0328) Pulse Rate:  [78-89] 78 (03/15 0328) Resp:  [17-18] 18 (03/15 0328) BP: (115-134)/(71-77) 134/77 (03/15 0328) SpO2:  [94 %-95 %] 94 % (03/15 0328)  Intake/Output from previous day:  Intake/Output Summary (Last 24 hours) at 06/23/2020 0942 Last data filed at 06/22/2020 2100 Gross per 24 hour  Intake 240 ml  Output --  Net 240 ml    Intake/Output this shift: No intake/output data recorded.  Labs: Recent Labs    06/23/20 0532  HGB 12.5*   Recent Labs    06/23/20 0532  WBC 10.6*  RBC 3.96*  HCT 36.2*  PLT 382   Recent Labs    06/23/20 0532  NA 137  K 4.6  CL 105  CO2 26  BUN 36*  CREATININE 0.91  GLUCOSE 100*  CALCIUM 8.4*   No results for input(s): LABPT, INR in the last 72 hours.   EXAM General - Patient is Alert, Appropriate and Oriented Extremity - Neurovascular intact Dorsiflexion/Plantar flexion intact Compartment soft;  Dressing/Incision -Pravena in place and working, no drainage Motor Function - intact, moving foot and toes well on exam.    Assessment/Plan: 4 Days Post-Op Procedure(s) (LRB): TOTAL HIP ARTHROPLASTY (Left) Active Problems:   Tobacco use   Femur fracture, left (HCC)   Alcohol abuse   Closed fracture of left hip (HCC)   Fall  Estimated body mass index is 16.92 kg/m as calculated from the following:   Height as of this encounter: 6\' 6"  (1.981 m).   Weight as of this encounter: 66.4 kg. Advance diet Up with therapy Work on and VSS Pain  well controlled CM to assist with discharge to SNF  Lovenox 40 mg subcu daily x14 days at discharge TED hose bilateral lower extremity x6 weeks Please remove provena negative pressure dressing on 06/30/2020 and apply honey comb dressing. Keep dressing clean and dry at all times.  DVT Prophylaxis - Lovenox, Ted hose and SCDs Weight-Bearing as tolerated to left leg  07/02/2020, PA-C [facility] Orthopaedic Surgery 06/23/2020, 9:42 AM

## 2020-06-24 DIAGNOSIS — S72002A Fracture of unspecified part of neck of left femur, initial encounter for closed fracture: Secondary | ICD-10-CM | POA: Diagnosis not present

## 2020-06-24 NOTE — Progress Notes (Signed)
PROGRESS NOTE    Charles Ray  ZOX:[id number] DOB: Mar 14, 1951 DOA: 06/18/2020 PCP: Jerl Mina, MD   Brief Narrative: Taken from H&P. Charles Ray is a 70 y.o. male with medical history significant for depression, alcohol abuse, tobacco abuse, presents to the emergency department for chief concerns of left hip pain after having a mechanical fall.  Denies any presyncope or syncope.  Found to have displaced transcervical fracture of proximal left femur.  Orthopedic was consulted and he underwent successful left total hip replacement on 06/19/2020.  Plan is to go to SNF before returning home-pending bed and insurance authorization.  Subjective: Pain controlled.  Normal oral intake.  Had BM.  No complaints.   Assessment & Plan:   Active Problems:   Tobacco use   Femur fracture, left (HCC)   Alcohol abuse   Closed fracture of left hip (HCC)   Fall  Acute displaced transcervical fracture of proximal left femur.  Secondary to mechanical fall.  Underwent total hip replacement yesterday. -Continue with pain management. -PT/OT evaluation -SNF placement -Patient will be discharged on Lovenox for 14 days as DVT prophylaxis. TED hose bilateral lower extremity x6 weeks --per ortho, remove provena negative pressure dressing on 07/01/2020 and apply honey comb dressing. Keep dressing clean and dry at all times. --Weight-Bearing as tolerated to left leg  Alcohol abuse.  Patient drinks half a pint daily.  Last drink was on 06/17/2020.  No sign of withdrawal at this time. --no need for more CIWA  Tobacco abuse.  Smokes 1 pack/day -Continue with nicotine patch  History of depression.  No acute concern. -Continue with home dose of amitriptyline at night.   Objective: Vitals:   06/23/20 2014 06/24/20 0449 06/24/20 0836 06/24/20 1101  BP: 126/75 105/62 119/82 102/73  Pulse: 86 81 83 84  Resp: 17 17 19 18   Temp: 98.4 F (36.9 C) 98 F (36.7 C) (!) 97.5 F (36.4 C) (!) 97.4 F (36.3  C)  TempSrc:    Oral  SpO2: 94% 98% 95% 95%  Weight:      Height:        Intake/Output Summary (Last 24 hours) at 06/24/2020 1440 Last data filed at 06/24/2020 1344 Gross per 24 hour  Intake 840 ml  Output 300 ml  Net 540 ml   Filed Weights   06/18/20 1944 06/18/20 2305  Weight: 76.2 kg 66.4 kg    Examination:  Constitutional: NAD, AAOx3 HEENT: conjunctivae and lids normal, EOMI CV: No cyanosis.   RESP: normal respiratory effort, on RA Extremities: No effusions, edema in BLE SKIN: warm, dry Neuro: II - XII grossly intact.   Psych: Normal mood and affect.  Appropriate judgement and reason   DVT prophylaxis: Lovenox Code Status: Full Family Communication: Wife and son updated at bedside today Disposition Plan:  Status is: Inpatient  Dispo: The patient is from: Home              Anticipated d/c is to: SNF              Patient currently is medically stable.   Difficult to place patient No              Level of care: Med-Surg  All the records are reviewed and case discussed with Care Management/Social Worker. Management plans discussed with the patient, nursing and they are in agreement.  Consultants:   Orthopedic surgery  Procedures:  Antimicrobials:   Data Reviewed: I have personally reviewed following labs and imaging studies  CBC: Recent Labs  Lab 06/18/20 2055 06/18/20 2256 06/19/20 2029 06/20/20 0532 06/23/20 0532  WBC 12.2* 14.2* 14.6* 11.7* 10.6*  NEUTROABS 9.6*  --   --   --   --   HGB 12.6* 13.2 12.4* 12.5* 12.5*  HCT 37.9* 39.2 37.7* 35.5* 36.2*  MCV 93.8 92.7 94.3 91.0 91.4  PLT 375 366 311 301 382   Basic Metabolic Panel: Recent Labs  Lab 06/18/20 2055 06/18/20 2256 06/19/20 2029 06/20/20 0532 06/23/20 0532  NA 138 138  --  133* 137  K 4.6 4.2  --  4.0 4.6  CL 104 104  --  104 105  CO2 26 27  --  21* 26  GLUCOSE 93 103*  --  118* 100*  BUN 40* 41*  --  19 36*  CREATININE 1.02 1.03 0.84 0.87 0.91  CALCIUM 8.7* 8.8*  --  8.3*  8.4*  MG  --  2.1  --   --   --   PHOS  --  2.9  --   --   --    GFR: Estimated Creatinine Clearance: 68.9 mL/min (by C-G formula based on SCr of 0.91 mg/dL). Liver Function Tests: Recent Labs  Lab 06/18/20 2256  AST 17  ALT 16  ALKPHOS 106  BILITOT 0.5  PROT 6.7  ALBUMIN 3.4*   No results for input(s): LIPASE, AMYLASE in the last 168 hours. No results for input(s): AMMONIA in the last 168 hours. Coagulation Profile: Recent Labs  Lab 06/18/20 2256  INR 1.0   Cardiac Enzymes: No results for input(s): CKTOTAL, CKMB, CKMBINDEX, TROPONINI in the last 168 hours. BNP (last 3 results) No results for input(s): PROBNP in the last 8760 hours. HbA1C: No results for input(s): HGBA1C in the last 72 hours. CBG: No results for input(s): GLUCAP in the last 168 hours. Lipid Profile: No results for input(s): CHOL, HDL, LDLCALC, TRIG, CHOLHDL, LDLDIRECT in the last 72 hours. Thyroid Function Tests: No results for input(s): TSH, T4TOTAL, FREET4, T3FREE, THYROIDAB in the last 72 hours. Anemia Panel: No results for input(s): VITAMINB12, FOLATE, FERRITIN, TIBC, IRON, RETICCTPCT in the last 72 hours. Sepsis Labs: No results for input(s): PROCALCITON, LATICACIDVEN in the last 168 hours.  Recent Results (from the past 240 hour(s))  Resp Panel by RT-PCR (Flu A&B, Covid) Nasopharyngeal Swab     Status: None   Collection Time: 06/18/20  8:55 PM   Specimen: Nasopharyngeal Swab; Nasopharyngeal(NP) swabs in vial transport medium  Result Value Ref Range Status   SARS Coronavirus 2 by RT PCR NEGATIVE NEGATIVE Final    Comment: (NOTE) SARS-CoV-2 target nucleic acids are NOT DETECTED.  The SARS-CoV-2 RNA is generally detectable in upper respiratory specimens during the acute phase of infection. The lowest concentration of SARS-CoV-2 viral copies this assay can detect is 138 copies/mL. A negative result does not preclude SARS-Cov-2 infection and should not be used as the sole basis for treatment  or other patient management decisions. A negative result may occur with  improper specimen collection/handling, submission of specimen other than nasopharyngeal swab, presence of viral mutation(s) within the areas targeted by this assay, and inadequate number of viral copies(<138 copies/mL). A negative result must be combined with clinical observations, patient history, and epidemiological information. The expected result is Negative.  Fact Sheet for Patients:  BloggerCourse.com  Fact Sheet for Healthcare Providers:  SeriousBroker.it  This test is no t yet approved or cleared by the [place] FDA and  has been authorized for detection and/or  diagnosis of SARS-CoV-2 by FDA under an Emergency Use Authorization (EUA). This EUA will remain  in effect (meaning this test can be used) for the duration of the COVID-19 declaration under Section 564(b)(1) of the Act, 21 U.S.C.section 360bbb-3(b)(1), unless the authorization is terminated  or revoked sooner.       Influenza A by PCR NEGATIVE NEGATIVE Final   Influenza B by PCR NEGATIVE NEGATIVE Final    Comment: (NOTE) The Xpert Xpress SARS-CoV-2/FLU/RSV plus assay is intended as an aid in the diagnosis of influenza from Nasopharyngeal swab specimens and should not be used as a sole basis for treatment. Nasal washings and aspirates are unacceptable for Xpert Xpress SARS-CoV-2/FLU/RSV testing.  Fact Sheet for Patients: BloggerCourse.com  Fact Sheet for Healthcare Providers: SeriousBroker.it  This test is not yet approved or cleared by the [place] FDA and has been authorized for detection and/or diagnosis of SARS-CoV-2 by FDA under an Emergency Use Authorization (EUA). This EUA will remain in effect (meaning this test can be used) for the duration of the COVID-19 declaration under Section 564(b)(1) of the Act, 21 U.S.C. section  360bbb-3(b)(1), unless the authorization is terminated or revoked.  Performed at [facility], [address], [place], [place] [address]   Surgical PCR screen     Status: None   Collection Time: 06/19/20  5:14 AM   Specimen: Nasal Mucosa; Nasal Swab  Result Value Ref Range Status   MRSA, PCR NEGATIVE NEGATIVE Final   Staphylococcus aureus NEGATIVE NEGATIVE Final    Comment: (NOTE) The Xpert SA Assay (FDA approved for NASAL specimens in patients 62 years of age and older), is one component of a comprehensive surveillance program. It is not intended to diagnose infection nor to guide or monitor treatment. Performed at [facility], [address], [place], [place] [address]      Radiology Studies: No results found.  Scheduled Meds: . amitriptyline  300 mg Oral QHS  . bisacodyl  10 mg Rectal Once  . docusate sodium  100 mg Oral BID  . enoxaparin (LOVENOX) injection  40 mg Subcutaneous Q24H  . folic acid  1 mg Oral Daily  . multivitamin with minerals  1 tablet Oral Daily  . pantoprazole  40 mg Oral Daily  . thiamine  100 mg Oral Daily   Or  . thiamine  100 mg Intravenous Daily   Continuous Infusions: . sodium chloride Stopped (06/20/20 0302)     LOS: 6 days    Darlin Priestly, MD [facility]  If 7PM-7AM, please contact night-coverage [email or web address]  06/24/2020, 2:40 PM

## 2020-06-24 NOTE — Progress Notes (Signed)
Physical Therapy Treatment Patient Details Name: Charles Ray MRN: [id number] DOB: Jul 03, 1950 Today's Date: 06/24/2020    History of Present Illness Pt is a 70 y/o M admitted on 06/18/20 with c/c of L hip pain following a fall after tripping on the sidewalk. 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 Rosita Kea on 06/19/20. Pt is now WBAT LLE. PMH: depression, alcohol abuse, tobacco abuse    PT Comments    Pt is making good progress towards goals with improved ambulation safety technique with use of RW. Decreased pain noted. Ability to increase stance time on surgical leg. Very pleasant and agreeable. Will continue to progress as able.  Follow Up Recommendations  SNF     Equipment Recommendations  None recommended by PT    Recommendations for Other Services       Precautions / Restrictions Precautions Precautions: Fall;Anterior Hip Precaution Comments: hip (anterior approach) Restrictions Weight Bearing Restrictions: Yes LLE Weight Bearing: Weight bearing as tolerated    Mobility  Bed Mobility Overal bed mobility: Needs Assistance Bed Mobility: Supine to Sit     Supine to sit: Min assist     General bed mobility comments: pt able to move B LE off bed with slight assistance. CGA for trunkal elevation. ONce seated at EOB, needs assist for scooting towards EOB.    Transfers Overall transfer level: Needs assistance Equipment used: Rolling walker (2 wheeled) Transfers: Sit to/from Stand Sit to Stand: From elevated surface;Min assist         General transfer comment: needs cues for scooting out towards EOB. Once standing, RW used  Ambulation/Gait Ambulation/Gait assistance: Editor, commissioning (Feet): 45 Feet Assistive device: Rolling walker (2 wheeled) Gait Pattern/deviations: Step-to pattern;Decreased step length - right;Decreased step length - left Gait velocity: decreased   General  Gait Details: Slow technique, however does improve with larger steps and keeping closer to RW this session. Still needs heavy cues during turns for safety. Fatigues with short distance.   Stairs             Wheelchair Mobility    Modified Rankin (Stroke Patients Only)       Balance Overall balance assessment: Needs assistance Sitting-balance support: No upper extremity supported;Feet supported Sitting balance-Leahy Scale: Good     Standing balance support: Bilateral upper extremity supported Standing balance-Leahy Scale: Fair                              Cognition Arousal/Alertness: Awake/alert Behavior During Therapy: WFL for tasks assessed/performed;Flat affect Overall Cognitive Status: Within Functional Limits for tasks assessed                                 General Comments: A&O x 4, flat affect      Exercises Other Exercises Other Exercises: B LE ther-ex including AP, quad sets, SLRs, and hip abd/add. Needs min assist for L LE and supervision for R LE. Cues given for safe technique.    General Comments        Pertinent Vitals/Pain Pain Assessment: No/denies pain    Home Living                      Prior Function            PT Goals (current  goals can now be found in the care plan section) Acute Rehab PT Goals Patient Stated Goal: go home PT Goal Formulation: With patient/family Time For Goal Achievement: 07/04/20 Potential to Achieve Goals: Good Progress towards PT goals: Progressing toward goals    Frequency    BID      PT Plan Current plan remains appropriate    Co-evaluation              AM-PAC PT "6 Clicks" Mobility   Outcome Measure  Help needed turning from your back to your side while in a flat bed without using bedrails?: A Little Help needed moving from lying on your back to sitting on the side of a flat bed without using bedrails?: A Little Help needed moving to and from a bed to a  chair (including a wheelchair)?: A Little Help needed standing up from a chair using your arms (e.g., wheelchair or bedside chair)?: A Little Help needed to walk in hospital room?: A Little Help needed climbing 3-5 steps with a railing? : A Lot 6 Click Score: 17    End of Session Equipment Utilized During Treatment: Gait belt Activity Tolerance: Patient tolerated treatment well;Patient limited by pain Patient left: in bed;with bed alarm set;with SCD's reapplied Nurse Communication: Mobility status PT Visit Diagnosis: Unsteadiness on feet (R26.81);Muscle weakness (generalized) (M62.81);Difficulty in walking, not elsewhere classified (R26.2);Pain;Repeated falls (R29.6) Pain - Right/Left: Left Pain - part of body: Hip     Time: 1350-1415 PT Time Calculation (min) (ACUTE ONLY): 25 min  Charges:  $Gait Training: 8-22 mins $Therapeutic Exercise: 8-22 mins                     Elizabeth Palau, PT, DPT [phone]    Charles Ray 06/24/2020, 4:52 PM

## 2020-06-24 NOTE — TOC Progression Note (Signed)
Transition of Care [facility]) - Progression Note    Patient Details  Name: Charles Ray MRN: [id number] Date of Birth: 11-21-1950  [facility]) CM/SW Contact  Margarito Liner, LCSW Phone Number: 06/24/2020, 1:18 PM  Clinical Narrative: Level 2 PASARR assessment completed with Rudi Rummage. Wife and son Ivin Booty at bedside. Their first preference is [facility] because son knows people that work there. [facility] is reviewing referral. Family aware that [facility] is the only offer so far. They do not want patient to go here due to negative reviews. CSW encouraged them to pick a second preference in case [facility] cannot offer. TOC coworker sent updated therapy notes to SNF's that were still pending to trigger more responses.  Expected Discharge Plan and Services                                                 Social Determinants of Health (SDOH) Interventions    Readmission Risk Interventions No flowsheet data found.

## 2020-06-24 NOTE — Progress Notes (Signed)
Physical Therapy Treatment Patient Details Name: Charles Ray MRN: [id number] DOB: [birth year] Today's Date: 06/24/2020    History of Present Illness Pt is a 70 y/o M admitted on 06/18/20 with c/c of L hip pain following a fall after tripping on the sidewalk. 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 Rosita Kea on 06/19/20. Pt is now WBAT LLE. PMH: depression, alcohol abuse, tobacco abuse    PT Comments    Pt is making good progress towards goals with ability to ambulate further distances this date. RW used with frequent cues for safety. Still requires physical assist for all OOB mobility. Alert and oriented this date and motivated to perform therapy. RN notified as pt requesting pain meds. Will continue to progress as able.   Follow Up Recommendations  SNF     Equipment Recommendations  None recommended by PT    Recommendations for Other Services       Precautions / Restrictions Precautions Precautions: Fall;Anterior Hip Precaution Comments: hip (anterior approach) Restrictions Weight Bearing Restrictions: Yes LLE Weight Bearing: Weight bearing as tolerated    Mobility  Bed Mobility Overal bed mobility: Needs Assistance Bed Mobility: Supine to Sit     Supine to sit: Min assist     General bed mobility comments: pt able to move B LE off bed with slight assistance. CGA for trunkal elevation. ONce seated at EOB, needs assist for scooting towards EOB.    Transfers Overall transfer level: Needs assistance Equipment used: Rolling walker (2 wheeled) Transfers: Sit to/from Stand Sit to Stand: From elevated surface;Min assist         General transfer comment: cues for hand placement. Once standing, upright posture noted  Ambulation/Gait Ambulation/Gait assistance: Min assist Gait Distance (Feet): 45 Feet Assistive device: Rolling walker (2 wheeled) Gait Pattern/deviations: Step-to pattern;Decreased  step length - right;Decreased step length - left Gait velocity: decreased   General Gait Details: small steps, tends to drag R foot due to limited WBing on L LE. Needs cues to keep RW closer to body and to stay within RW during turns.   Stairs             Wheelchair Mobility    Modified Rankin (Stroke Patients Only)       Balance Overall balance assessment: Needs assistance Sitting-balance support: No upper extremity supported;Feet supported Sitting balance-Leahy Scale: Good     Standing balance support: Bilateral upper extremity supported Standing balance-Leahy Scale: Fair                              Cognition Arousal/Alertness: Awake/alert Behavior During Therapy: WFL for tasks assessed/performed;Flat affect Overall Cognitive Status: Within Functional Limits for tasks assessed                                        Exercises Other Exercises Other Exercises: B LE ther-ex including AP, quad sets, SLRs, and hip abd/add. Needs min assist for L LE and supervision for R LE. Cues given for safe technique.    General Comments        Pertinent Vitals/Pain Pain Assessment: 0-10 Pain Score: 5  Pain Location: L hip Pain Descriptors / Indicators: Sore Pain Intervention(s): Limited activity within patient's tolerance;Repositioned;Patient requesting pain meds-RN notified    Home Living  Prior Function            PT Goals (current goals can now be found in the care plan section) Acute Rehab PT Goals Patient Stated Goal: go home PT Goal Formulation: With patient/family Time For Goal Achievement: 07/04/20 Potential to Achieve Goals: Good Progress towards PT goals: Progressing toward goals    Frequency    BID      PT Plan Current plan remains appropriate    Co-evaluation              AM-PAC PT "6 Clicks" Mobility   Outcome Measure  Help needed turning from your back to your side while in a  flat bed without using bedrails?: A Little Help needed moving from lying on your back to sitting on the side of a flat bed without using bedrails?: A Little Help needed moving to and from a bed to a chair (including a wheelchair)?: A Little Help needed standing up from a chair using your arms (e.g., wheelchair or bedside chair)?: A Little Help needed to walk in hospital room?: A Little Help needed climbing 3-5 steps with a railing? : A Lot 6 Click Score: 17    End of Session Equipment Utilized During Treatment: Gait belt Activity Tolerance: Patient tolerated treatment well;Patient limited by pain Patient left: in chair;with chair alarm set;with family/visitor present Nurse Communication: Mobility status PT Visit Diagnosis: Unsteadiness on feet (R26.81);Muscle weakness (generalized) (M62.81);Difficulty in walking, not elsewhere classified (R26.2);Pain;Repeated falls (R29.6) Pain - Right/Left: Left Pain - part of body: Hip     Time: 6644-0347 PT Time Calculation (min) (ACUTE ONLY): 23 min  Charges:  $Gait Training: 8-22 mins $Therapeutic Exercise: 8-22 mins                     Elizabeth Palau, PT, DPT [phone]    Charles Ray 06/24/2020, 10:49 AM

## 2020-06-24 NOTE — Care Management Important Message (Signed)
Important Message  Patient Details  Name: Charles Ray MRN: [id number] Date of Birth: July 25, 1950   Medicare Important Message Given:  Yes     [name] [name] 06/24/2020, 1:31 PM

## 2020-06-24 NOTE — Progress Notes (Signed)
  Subjective: 5 Days Post-Op Procedure(s) (LRB): TOTAL HIP ARTHROPLASTY (Left) Patient reports pain as mild.   Patient is well, and has had no acute complaints or problems Plan is to go Rehab after hospital stay. Negative for chest pain and shortness of breath Fever: no Gastrointestinal: negative for nausea and vomiting.   Objective: Vital signs in last 24 hours: Temp:  [98 F (36.7 C)-98.4 F (36.9 C)] 98 F (36.7 C) (03/16 0449) Pulse Rate:  [81-86] 81 (03/16 0449) Resp:  [17-18] 17 (03/16 0449) BP: (105-128)/(62-88) 105/62 (03/16 0449) SpO2:  [93 %-98 %] 98 % (03/16 0449)  Intake/Output from previous day:  Intake/Output Summary (Last 24 hours) at 06/24/2020 0820 Last data filed at 06/23/2020 2300 Gross per 24 hour  Intake 480 ml  Output 450 ml  Net 30 ml    Intake/Output this shift: No intake/output data recorded.  Labs: Recent Labs    06/23/20 0532  HGB 12.5*   Recent Labs    06/23/20 0532  WBC 10.6*  RBC 3.96*  HCT 36.2*  PLT 382   Recent Labs    06/23/20 0532  NA 137  K 4.6  CL 105  CO2 26  BUN 36*  CREATININE 0.91  GLUCOSE 100*  CALCIUM 8.4*   No results for input(s): LABPT, INR in the last 72 hours.   EXAM General - Patient is Alert, Appropriate and Oriented Extremity - Neurovascular intact Dorsiflexion/Plantar flexion intact Compartment soft;  Dressing/Incision -Pravena in place and working, no drainage Motor Function - intact, moving foot and toes well on exam.    Assessment/Plan: 5 Days Post-Op Procedure(s) (LRB): TOTAL HIP ARTHROPLASTY (Left) Active Problems:   Tobacco use   Femur fracture, left (HCC)   Alcohol abuse   Closed fracture of left hip (HCC)   Fall  Estimated body mass index is 16.92 kg/m as calculated from the following:   Height as of this encounter: 6\' 6"  (1.981 m).   Weight as of this encounter: 66.4 kg. Advance diet Up with therapy Labs and VSS Pain well controlled CM to assist with discharge to  SNF  Lovenox 40 mg subcu daily x14 days at discharge TED hose bilateral lower extremity x6 weeks Please remove provena negative pressure dressing on 07/01/2020 and apply honey comb dressing. Keep dressing clean and dry at all times.  DVT Prophylaxis - Lovenox, Ted hose and SCDs Weight-Bearing as tolerated to left leg  07/03/2020, PA-C [facility] Orthopaedic Surgery 06/24/2020, 8:20 AM

## 2020-06-25 DIAGNOSIS — S72002A Fracture of unspecified part of neck of left femur, initial encounter for closed fracture: Secondary | ICD-10-CM | POA: Diagnosis not present

## 2020-06-25 LAB — CBC
HCT: 33.1 % — ABNORMAL LOW (ref 39.0–52.0)
Hemoglobin: 11.4 g/dL — ABNORMAL LOW (ref 13.0–17.0)
MCH: 31.2 pg (ref 26.0–34.0)
MCHC: 34.4 g/dL (ref 30.0–36.0)
MCV: 90.7 fL (ref 80.0–100.0)
Platelets: 428 10*3/uL — ABNORMAL HIGH (ref 150–400)
RBC: 3.65 MIL/uL — ABNORMAL LOW (ref 4.22–5.81)
RDW: 13.8 % (ref 11.5–15.5)
WBC: 9.8 10*3/uL (ref 4.0–10.5)
nRBC: 0 % (ref 0.0–0.2)

## 2020-06-25 LAB — BASIC METABOLIC PANEL
Anion gap: 6 (ref 5–15)
BUN: 34 mg/dL — ABNORMAL HIGH (ref 8–23)
CO2: 25 mmol/L (ref 22–32)
Calcium: 8.4 mg/dL — ABNORMAL LOW (ref 8.9–10.3)
Chloride: 103 mmol/L (ref 98–111)
Creatinine, Ser: 0.8 mg/dL (ref 0.61–1.24)
GFR, Estimated: >60 mL/min (ref >60)
Glucose, Bld: 103 mg/dL — ABNORMAL HIGH (ref 70–99)
Potassium: 4.4 mmol/L (ref 3.5–5.1)
Sodium: 134 mmol/L — ABNORMAL LOW (ref 135–145)

## 2020-06-25 LAB — MAGNESIUM: Magnesium: 2.2 mg/dL (ref 1.7–2.4)

## 2020-06-25 NOTE — Progress Notes (Signed)
PROGRESS NOTE    Charles Ray  BSW:[id number] DOB: 1950/10/17 DOA: 06/18/2020 PCP: Jerl Mina, MD   Brief Narrative: Taken from H&P. Charles Ray is a 70 y.o. male with medical history significant for depression, alcohol abuse, tobacco abuse, presents to the emergency department for chief concerns of left hip pain after having a mechanical fall.  Denies any presyncope or syncope.  Found to have displaced transcervical fracture of proximal left femur.  Orthopedic was consulted and he underwent successful left total hip replacement on 06/19/2020.  Plan is to go to SNF before returning home-pending bed and insurance authorization.  Subjective: Pt had no complaints.  Reported feeling better.    Assessment & Plan:   Active Problems:   Tobacco use   Femur fracture, left (HCC)   Alcohol abuse   Closed fracture of left hip (HCC)   Fall  Acute displaced transcervical fracture of proximal left femur.  Secondary to mechanical fall.  Underwent total hip replacement yesterday. -Continue with pain management. -PT/OT evaluation -SNF placement -Patient will be discharged on Lovenox for 14 days as DVT prophylaxis. TED hose bilateral lower extremity x6 weeks --per ortho, remove provena negative pressure dressing on 07/01/2020 and apply honey comb dressing. Keep dressing clean and dry at all times. --Weight-Bearing as tolerated to left leg  Alcohol abuse.  Patient drinks half a pint daily.  Last drink was on 06/17/2020.  No sign of withdrawal at this time. --no need for more CIWA  Tobacco abuse.  Smokes 1 pack/day -Continue with nicotine patch  History of depression.  No acute concern. -Continue with home dose of amitriptyline at night.  Underweight, BMI 16.9   Objective: Vitals:   06/24/20 2333 06/25/20 0450 06/25/20 0840 06/25/20 1207  BP: 107/66 (!) 110/58 125/80 94/65  Pulse: 82 80 86 84  Resp: 20 17 18 18   Temp: 98 F (36.7 C) (!) 97.5 F (36.4 C) (!) 97.5 F (36.4 C)  97.8 F (36.6 C)  TempSrc: Oral Oral Oral Oral  SpO2: 97% 94% 95% 97%  Weight:      Height:        Intake/Output Summary (Last 24 hours) at 06/25/2020 1433 Last data filed at 06/25/2020 1012 Gross per 24 hour  Intake 240 ml  Output 650 ml  Net -410 ml   Filed Weights   06/18/20 1944 06/18/20 2305  Weight: 76.2 kg 66.4 kg    Examination:  Constitutional: NAD, AAOx3, very thin HEENT: conjunctivae and lids normal, EOMI CV: No cyanosis.   RESP: normal respiratory effort, on RA Extremities: No effusions, edema in BLE SKIN: warm, dry Neuro: II - XII grossly intact.   Psych: flat mood and affect.  Appropriate judgement and reason   DVT prophylaxis: Lovenox Code Status: Full Family Communication: Wife updated at bedside today Disposition Plan:  Status is: Inpatient  Dispo: The patient is from: Home              Anticipated d/c is to: SNF              Patient currently is medically stable.   Difficult to place patient No              Level of care: Med-Surg  All the records are reviewed and case discussed with Care Management/Social Worker. Management plans discussed with the patient, nursing and they are in agreement.  Consultants:   Orthopedic surgery  Procedures:  Antimicrobials:   Data Reviewed: I have personally reviewed following labs and  imaging studies  CBC: Recent Labs  Lab 06/18/20 2055 06/18/20 2256 06/19/20 2029 06/20/20 0532 06/23/20 0532 06/25/20 0629  WBC 12.2* 14.2* 14.6* 11.7* 10.6* 9.8  NEUTROABS 9.6*  --   --   --   --   --   HGB 12.6* 13.2 12.4* 12.5* 12.5* 11.4*  HCT 37.9* 39.2 37.7* 35.5* 36.2* 33.1*  MCV 93.8 92.7 94.3 91.0 91.4 90.7  PLT 375 366 311 301 382 428*   Basic Metabolic Panel: Recent Labs  Lab 06/18/20 2055 06/18/20 2256 06/19/20 2029 06/20/20 0532 06/23/20 0532 06/25/20 0629  NA 138 138  --  133* 137 134*  K 4.6 4.2  --  4.0 4.6 4.4  CL 104 104  --  104 105 103  CO2 26 27  --  21* 26 25  GLUCOSE 93 103*  --   118* 100* 103*  BUN 40* 41*  --  19 36* 34*  CREATININE 1.02 1.03 0.84 0.87 0.91 0.80  CALCIUM 8.7* 8.8*  --  8.3* 8.4* 8.4*  MG  --  2.1  --   --   --  2.2  PHOS  --  2.9  --   --   --   --    GFR: Estimated Creatinine Clearance: 78.4 mL/min (by C-G formula based on SCr of 0.8 mg/dL). Liver Function Tests: Recent Labs  Lab 06/18/20 2256  AST 17  ALT 16  ALKPHOS 106  BILITOT 0.5  PROT 6.7  ALBUMIN 3.4*   No results for input(s): LIPASE, AMYLASE in the last 168 hours. No results for input(s): AMMONIA in the last 168 hours. Coagulation Profile: Recent Labs  Lab 06/18/20 2256  INR 1.0   Cardiac Enzymes: No results for input(s): CKTOTAL, CKMB, CKMBINDEX, TROPONINI in the last 168 hours. BNP (last 3 results) No results for input(s): PROBNP in the last 8760 hours. HbA1C: No results for input(s): HGBA1C in the last 72 hours. CBG: No results for input(s): GLUCAP in the last 168 hours. Lipid Profile: No results for input(s): CHOL, HDL, LDLCALC, TRIG, CHOLHDL, LDLDIRECT in the last 72 hours. Thyroid Function Tests: No results for input(s): TSH, T4TOTAL, FREET4, T3FREE, THYROIDAB in the last 72 hours. Anemia Panel: No results for input(s): VITAMINB12, FOLATE, FERRITIN, TIBC, IRON, RETICCTPCT in the last 72 hours. Sepsis Labs: No results for input(s): PROCALCITON, LATICACIDVEN in the last 168 hours.  Recent Results (from the past 240 hour(s))  Resp Panel by RT-PCR (Flu A&B, Covid) Nasopharyngeal Swab     Status: None   Collection Time: 06/18/20  8:55 PM   Specimen: Nasopharyngeal Swab; Nasopharyngeal(NP) swabs in vial transport medium  Result Value Ref Range Status   SARS Coronavirus 2 by RT PCR NEGATIVE NEGATIVE Final    Comment: (NOTE) SARS-CoV-2 target nucleic acids are NOT DETECTED.  The SARS-CoV-2 RNA is generally detectable in upper respiratory specimens during the acute phase of infection. The lowest concentration of SARS-CoV-2 viral copies this assay can detect  is 138 copies/mL. A negative result does not preclude SARS-Cov-2 infection and should not be used as the sole basis for treatment or other patient management decisions. A negative result may occur with  improper specimen collection/handling, submission of specimen other than nasopharyngeal swab, presence of viral mutation(s) within the areas targeted by this assay, and inadequate number of viral copies(<138 copies/mL). A negative result must be combined with clinical observations, patient history, and epidemiological information. The expected result is Negative.  Fact Sheet for Patients:  BloggerCourse.com  Fact Sheet for  Healthcare Providers:  SeriousBroker.it  This test is no t yet approved or cleared by the [place] and  has been authorized for detection and/or diagnosis of SARS-CoV-2 by FDA under an Emergency Use Authorization (EUA). This EUA will remain  in effect (meaning this test can be used) for the duration of the COVID-19 declaration under Section 564(b)(1) of the Act, 21 U.S.C.section 360bbb-3(b)(1), unless the authorization is terminated  or revoked sooner.       Influenza A by PCR NEGATIVE NEGATIVE Final   Influenza B by PCR NEGATIVE NEGATIVE Final    Comment: (NOTE) The Xpert Xpress SARS-CoV-2/FLU/RSV plus assay is intended as an aid in the diagnosis of influenza from Nasopharyngeal swab specimens and should not be used as a sole basis for treatment. Nasal washings and aspirates are unacceptable for Xpert Xpress SARS-CoV-2/FLU/RSV testing.  Fact Sheet for Patients: BloggerCourse.com  Fact Sheet for Healthcare Providers: SeriousBroker.it  This test is not yet approved or cleared by the [place] FDA and has been authorized for detection and/or diagnosis of SARS-CoV-2 by FDA under an Emergency Use Authorization (EUA). This EUA will remain in effect  (meaning this test can be used) for the duration of the COVID-19 declaration under Section 564(b)(1) of the Act, 21 U.S.C. section 360bbb-3(b)(1), unless the authorization is terminated or revoked.  Performed at [facility], [address], [place], [place] [address]   Surgical PCR screen     Status: None   Collection Time: 06/19/20  5:14 AM   Specimen: Nasal Mucosa; Nasal Swab  Result Value Ref Range Status   MRSA, PCR NEGATIVE NEGATIVE Final   Staphylococcus aureus NEGATIVE NEGATIVE Final    Comment: (NOTE) The Xpert SA Assay (FDA approved for NASAL specimens in patients 24 years of age and older), is one component of a comprehensive surveillance program. It is not intended to diagnose infection nor to guide or monitor treatment. Performed at [facility], [address] [place], [place] [address]      Radiology Studies: No results found.  Scheduled Meds: . amitriptyline  300 mg Oral QHS  . bisacodyl  10 mg Rectal Once  . docusate sodium  100 mg Oral BID  . enoxaparin (LOVENOX) injection  40 mg Subcutaneous Q24H  . folic acid  1 mg Oral Daily  . multivitamin with minerals  1 tablet Oral Daily  . pantoprazole  40 mg Oral Daily  . thiamine  100 mg Oral Daily   Or  . thiamine  100 mg Intravenous Daily   Continuous Infusions: . sodium chloride Stopped (06/20/20 0302)     LOS: 7 days    Darlin Priestly, MD [facility]  If 7PM-7AM, please contact night-coverage [email or web address]  06/25/2020, 2:33 PM

## 2020-06-25 NOTE — TOC Progression Note (Signed)
Transition of Care [facility]) - Progression Note    Patient Details  Name: Charles SCURLOCK MRN: [id number] Date of Birth: [birth year]  [facility] CM/SW Contact  Margarito Liner, LCSW Phone Number: 06/25/2020, 4:25 PM  Clinical Narrative: [facility] still under review. [facility] trying to make room for patient. If not, only bed offers are [facility] and [facility]. Left wife a Engineer, technical sales.    Expected Discharge Plan and Services                                                 Social Determinants of Health (SDOH) Interventions    Readmission Risk Interventions No flowsheet data found.

## 2020-06-25 NOTE — Progress Notes (Signed)
Physical Therapy Treatment Patient Details Name: Charles Ray MRN: [id number] DOB: [birth year] Today's Date: 06/25/2020    History of Present Illness Pt is a 70 y/o M admitted on 06/18/20 with c/c of L hip pain following a fall after tripping on the sidewalk. 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 Rosita Kea on 06/19/20. Pt is now WBAT LLE. PMH: depression, alcohol abuse, tobacco abuse    PT Comments    Pt is making slow progress this date, limited by pain, despite being premedicated prior to session. Continues to be motivated and reports increased pain with WBing. Will continue to progress.  Follow Up Recommendations  SNF     Equipment Recommendations  None recommended by PT    Recommendations for Other Services       Precautions / Restrictions Precautions Precautions: Fall;Anterior Hip Precaution Booklet Issued: Yes (comment) Precaution Comments: hip (anterior approach) Restrictions Weight Bearing Restrictions: Yes LLE Weight Bearing: Weight bearing as tolerated    Mobility  Bed Mobility Overal bed mobility: Needs Assistance Bed Mobility: Supine to Sit     Supine to sit: Min assist     General bed mobility comments: follows commands well. Still very weak in B LE, needing physical assist. Once seated at EOB, upright posture noted    Transfers Overall transfer level: Needs assistance Equipment used: Rolling walker (2 wheeled) Transfers: Sit to/from Stand Sit to Stand: From elevated surface;Min assist         General transfer comment: needs assist and cues for hand placement. Once standing, upright posture noted. Limited WBing on L LE.  Ambulation/Gait Ambulation/Gait assistance: Mod assist Gait Distance (Feet): 5 Feet Assistive device: Rolling walker (2 wheeled) Gait Pattern/deviations: Step-to pattern;Decreased step length - right;Decreased step length - left     General Gait  Details: Increased pain this date, limiting further mobility. Short step to gait pattern   Stairs             Wheelchair Mobility    Modified Rankin (Stroke Patients Only)       Balance Overall balance assessment: Needs assistance Sitting-balance support: No upper extremity supported;Feet supported Sitting balance-Leahy Scale: Good     Standing balance support: Bilateral upper extremity supported Standing balance-Leahy Scale: Fair                              Cognition Arousal/Alertness: Awake/alert Behavior During Therapy: WFL for tasks assessed/performed;Flat affect Overall Cognitive Status: Within Functional Limits for tasks assessed                                 General Comments: A&O x 4, flat affect      Exercises Other Exercises Other Exercises: B LE ther-ex including AP, quad sets, SLRs, and hip abd/add. Needs min assist for L LE and supervision for R LE. Cues given for safe technique.    General Comments        Pertinent Vitals/Pain Pain Assessment: Faces Faces Pain Scale: Hurts even more Pain Location: L hip with WBing Pain Descriptors / Indicators: Sore Pain Intervention(s): Limited activity within patient's tolerance    Home Living                      Prior Function  PT Goals (current goals can now be found in the care plan section) Acute Rehab PT Goals Patient Stated Goal: go home PT Goal Formulation: With patient/family Time For Goal Achievement: 07/04/20 Potential to Achieve Goals: Good Progress towards PT goals: Progressing toward goals    Frequency    BID      PT Plan Current plan remains appropriate    Co-evaluation              AM-PAC PT "6 Clicks" Mobility   Outcome Measure  Help needed turning from your back to your side while in a flat bed without using bedrails?: A Little Help needed moving from lying on your back to sitting on the side of a flat bed without  using bedrails?: A Little Help needed moving to and from a bed to a chair (including a wheelchair)?: A Little Help needed standing up from a chair using your arms (e.g., wheelchair or bedside chair)?: A Little Help needed to walk in hospital room?: A Little Help needed climbing 3-5 steps with a railing? : A Lot 6 Click Score: 17    End of Session Equipment Utilized During Treatment: Gait belt Activity Tolerance: Patient limited by pain Patient left: in chair;with chair alarm set;with SCD's reapplied (chair alarm not working) Nurse Communication: Mobility status PT Visit Diagnosis: Unsteadiness on feet (R26.81);Muscle weakness (generalized) (M62.81);Difficulty in walking, not elsewhere classified (R26.2);Pain;Repeated falls (R29.6) Pain - Right/Left: Left Pain - part of body: Hip     Time: 5462-7035 PT Time Calculation (min) (ACUTE ONLY): 23 min  Charges:  $Gait Training: 8-22 mins $Therapeutic Exercise: 8-22 mins                     Elizabeth Palau, PT, DPT [phone]    Charles Ray 06/25/2020, 1:36 PM

## 2020-06-25 NOTE — Progress Notes (Signed)
Physical Therapy Treatment Patient Details Name: Charles Ray MRN: [id number] DOB: 09/04/1950 Today's Date: 06/25/2020    History of Present Illness Pt is a 70 y/o M admitted on 06/18/20 with c/c of L hip pain following a fall after tripping on the sidewalk. 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 Rosita Kea on 06/19/20. Pt is now WBAT LLE. PMH: depression, alcohol abuse, tobacco abuse    PT Comments    Pt is making gradual progress towards goals with ability to perform increased reps on there-ex, however during ambulation attempt, unable to perform due to pain with WBing. Able to perform several standing marches and kicks with L LE. Will continue to progress as able.   Follow Up Recommendations  SNF     Equipment Recommendations  None recommended by PT    Recommendations for Other Services       Precautions / Restrictions Precautions Precautions: Fall;Anterior Hip Precaution Booklet Issued: Yes (comment) Precaution Comments: hip (anterior approach) Restrictions Weight Bearing Restrictions: Yes LLE Weight Bearing: Weight bearing as tolerated    Mobility  Bed Mobility Overal bed mobility: Needs Assistance Bed Mobility: Supine to Sit     Supine to sit: Min assist     General bed mobility comments: follows commands, slightly easier to transition to EOB this date. Need min assist for return back supine.    Transfers Overall transfer level: Needs assistance Equipment used: Rolling walker (2 wheeled) Transfers: Sit to/from Stand Sit to Stand: From elevated surface;Min assist         General transfer comment: needs assist and cues for hand placement. Once standing, upright posture noted. Limited WBing on L LE. Once standing, able to march in place, however increased pain noted on L LE.  Ambulation/Gait Ambulation/Gait assistance: Mod assist Gait Distance (Feet): 5 Feet Assistive device: Rolling  walker (2 wheeled) Gait Pattern/deviations: Step-to pattern;Decreased step length - right;Decreased step length - left     General Gait Details: due to pain, unable to ambulate at this time.   Stairs             Wheelchair Mobility    Modified Rankin (Stroke Patients Only)       Balance Overall balance assessment: Needs assistance Sitting-balance support: No upper extremity supported;Feet supported Sitting balance-Leahy Scale: Good     Standing balance support: Bilateral upper extremity supported Standing balance-Leahy Scale: Fair                              Cognition Arousal/Alertness: Awake/alert Behavior During Therapy: WFL for tasks assessed/performed;Flat affect Overall Cognitive Status: Within Functional Limits for tasks assessed                                 General Comments: A&O x 4, flat affect      Exercises Other Exercises Other Exercises: B LE ther-ex including AP, quad sets, SLRs, hip add squeezes, SAQ, and hip abd/add. Needs min assist for L LE and supervision for R LE. Cues given for safe technique. 15 reps performed    General Comments        Pertinent Vitals/Pain Pain Assessment: 0-10 Pain Score: 6  Faces Pain Scale: Hurts even more Pain Location: L hip with WBing Pain Descriptors / Indicators: Sore Pain Intervention(s): Limited activity within patient's tolerance  Home Living                      Prior Function            PT Goals (current goals can now be found in the care plan section) Acute Rehab PT Goals Patient Stated Goal: go home PT Goal Formulation: With patient/family Time For Goal Achievement: 07/04/20 Potential to Achieve Goals: Good Progress towards PT goals: Progressing toward goals    Frequency    BID      PT Plan Current plan remains appropriate    Co-evaluation              AM-PAC PT "6 Clicks" Mobility   Outcome Measure  Help needed turning from your  back to your side while in a flat bed without using bedrails?: A Little Help needed moving from lying on your back to sitting on the side of a flat bed without using bedrails?: A Little Help needed moving to and from a bed to a chair (including a wheelchair)?: A Little Help needed standing up from a chair using your arms (e.g., wheelchair or bedside chair)?: A Little Help needed to walk in hospital room?: A Little Help needed climbing 3-5 steps with a railing? : A Lot 6 Click Score: 17    End of Session Equipment Utilized During Treatment: Gait belt Activity Tolerance: Patient limited by pain Patient left: in bed;with bed alarm set Nurse Communication: Mobility status PT Visit Diagnosis: Unsteadiness on feet (R26.81);Muscle weakness (generalized) (M62.81);Difficulty in walking, not elsewhere classified (R26.2);Pain;Repeated falls (R29.6) Pain - Right/Left: Left Pain - part of body: Hip     Time: 7893-8101 PT Time Calculation (min) (ACUTE ONLY): 18 min  Charges:  $Gait Training: 8-22 mins $Therapeutic Exercise: 8-22 mins                     Elizabeth Palau, PT, DPT [phone]    Charles Ray 06/25/2020, 3:14 PM

## 2020-06-25 NOTE — Progress Notes (Signed)
  Subjective: 6 Days Post-Op Procedure(s) (LRB): TOTAL HIP ARTHROPLASTY (Left) Patient reports pain as mild.   Patient is well, and has had no acute complaints or problems Plan is to go Rehab after hospital stay. Negative for chest pain and shortness of breath Fever: no Gastrointestinal: negative for nausea and vomiting.   Objective: Vital signs in last 24 hours: Temp:  [97.4 F (36.3 C)-98.6 F (37 C)] 97.5 F (36.4 C) (03/17 0840) Pulse Rate:  [80-86] 86 (03/17 0840) Resp:  [17-20] 18 (03/17 0840) BP: (102-125)/(58-80) 125/80 (03/17 0840) SpO2:  [92 %-97 %] 95 % (03/17 0840)  Intake/Output from previous day:  Intake/Output Summary (Last 24 hours) at 06/25/2020 1032 Last data filed at 06/25/2020 1012 Gross per 24 hour  Intake 480 ml  Output 850 ml  Net -370 ml    Intake/Output this shift: Total I/O In: 0  Out: 300 [Urine:300]  Labs: Recent Labs    06/23/20 0532 06/25/20 0629  HGB 12.5* 11.4*   Recent Labs    06/23/20 0532 06/25/20 0629  WBC 10.6* 9.8  RBC 3.96* 3.65*  HCT 36.2* 33.1*  PLT 382 428*   Recent Labs    06/23/20 0532 06/25/20 0629  NA 137 134*  K 4.6 4.4  CL 105 103  CO2 26 25  BUN 36* 34*  CREATININE 0.91 0.80  GLUCOSE 100* 103*  CALCIUM 8.4* 8.4*   No results for input(s): LABPT, INR in the last 72 hours.   EXAM General - Patient is Alert, Appropriate and Oriented Extremity - Neurovascular intact Dorsiflexion/Plantar flexion intact Compartment soft;  Dressing/Incision -Pravena in place and working, no drainage Motor Function - intact, moving foot and toes well on exam.    Assessment/Plan: 6 Days Post-Op Procedure(s) (LRB): TOTAL HIP ARTHROPLASTY (Left) Active Problems:   Tobacco use   Femur fracture, left (HCC)   Alcohol abuse   Closed fracture of left hip (HCC)   Fall  Estimated body mass index is 16.92 kg/m as calculated from the following:   Height as of this encounter: 6\' 6"  (1.981 m).   Weight as of this  encounter: 66.4 kg. Advance diet Up with therapy Labs and VSS Pain well controlled CM to assist with discharge to SNF  Lovenox 40 mg subcu daily x14 days at discharge TED hose bilateral lower extremity x6 weeks Please remove provena negative pressure dressing on 07/01/2020 and apply honey comb dressing. Keep dressing clean and dry at all times.  DVT Prophylaxis - Lovenox, Ted hose and SCDs Weight-Bearing as tolerated to left leg  07/03/2020, PA-C [facility] Orthopaedic Surgery 06/25/2020, 10:32 AM

## 2020-06-26 DIAGNOSIS — S72002A Fracture of unspecified part of neck of left femur, initial encounter for closed fracture: Secondary | ICD-10-CM | POA: Diagnosis not present

## 2020-06-26 LAB — BASIC METABOLIC PANEL
Anion gap: 6 (ref 5–15)
BUN: 35 mg/dL — ABNORMAL HIGH (ref 8–23)
CO2: 25 mmol/L (ref 22–32)
Calcium: 8.8 mg/dL — ABNORMAL LOW (ref 8.9–10.3)
Chloride: 103 mmol/L (ref 98–111)
Creatinine, Ser: 0.91 mg/dL (ref 0.61–1.24)
GFR, Estimated: >60 mL/min (ref >60)
Glucose, Bld: 97 mg/dL (ref 70–99)
Potassium: 4.2 mmol/L (ref 3.5–5.1)
Sodium: 134 mmol/L — ABNORMAL LOW (ref 135–145)

## 2020-06-26 LAB — CBC
HCT: 33.3 % — ABNORMAL LOW (ref 39.0–52.0)
Hemoglobin: 11.4 g/dL — ABNORMAL LOW (ref 13.0–17.0)
MCH: 31.1 pg (ref 26.0–34.0)
MCHC: 34.2 g/dL (ref 30.0–36.0)
MCV: 90.7 fL (ref 80.0–100.0)
Platelets: 474 10*3/uL — ABNORMAL HIGH (ref 150–400)
RBC: 3.67 MIL/uL — ABNORMAL LOW (ref 4.22–5.81)
RDW: 13.9 % (ref 11.5–15.5)
WBC: 8.7 10*3/uL (ref 4.0–10.5)
nRBC: 0 % (ref 0.0–0.2)

## 2020-06-26 LAB — MAGNESIUM: Magnesium: 2.2 mg/dL (ref 1.7–2.4)

## 2020-06-26 NOTE — Progress Notes (Signed)
PT Cancellation Note  Patient Details Name: Charles Ray MRN: [id number] DOB: 15-Jun-1950   Cancelled Treatment:    Reason Eval/Treat Not Completed: Other (comment)   Pt in bathroom with tech for bathing. He had walked in to bathroom with tech.  Will defer PM session given increased activity with nursing this date and continue tomorrow as appropriate.     Danielle Dess 06/26/2020, 1:47 PM

## 2020-06-26 NOTE — Progress Notes (Signed)
PROGRESS NOTE    Charles Ray  WUJ:[id number] DOB: Oct 28, 1950 DOA: 06/18/2020 PCP: Jerl Mina, MD   Brief Narrative: Taken from H&P. Charles Ray is a 70 y.o. male with medical history significant for depression, alcohol abuse, tobacco abuse, presents to the emergency department for chief concerns of left hip pain after having a mechanical fall.  Denies any presyncope or syncope.  Found to have displaced transcervical fracture of proximal left femur.  Orthopedic was consulted and he underwent successful left total hip replacement on 06/19/2020.  Plan is to go to SNF before returning home-pending bed and insurance authorization.  Subjective: Pt reported doing good.  Having BM's.  Normal oral intake.  No pain.     Assessment & Plan:   Active Problems:   Tobacco use   Femur fracture, left (HCC)   Alcohol abuse   Closed fracture of left hip (HCC)   Fall  Acute displaced transcervical fracture of proximal left femur.  Secondary to mechanical fall.  Underwent total hip replacement yesterday. -Continue with pain management. -PT/OT evaluation -SNF placement -Patient will be discharged on Lovenox for 14 days as DVT prophylaxis. TED hose bilateral lower extremity x6 weeks --per ortho, remove provena negative pressure dressing on 07/01/2020 and apply honey comb dressing. Keep dressing clean and dry at all times. --Weight-Bearing as tolerated to left leg  Alcohol abuse.  Patient drinks half a pint daily.  Last drink was on 06/17/2020.  No sign of withdrawal at this time. --no need for more CIWA  Tobacco abuse.  Smokes 1 pack/day -Continue with nicotine patch  History of depression.  No acute concern. -Continue with home dose of amitriptyline at night.  Underweight, BMI 16.9   Objective: Vitals:   06/26/20 0349 06/26/20 0806 06/26/20 1247 06/26/20 1555  BP: (!) 107/57 120/69 116/71 115/66  Pulse: 84 79 79 84  Resp: 20 17 18 16   Temp: 97.6 F (36.4 C) (!) 97.4 F (36.3  C) 97.8 F (36.6 C) 97.9 F (36.6 C)  TempSrc:      SpO2: 97% 96% 98% 98%  Weight:      Height:        Intake/Output Summary (Last 24 hours) at 06/26/2020 1656 Last data filed at 06/26/2020 1359 Gross per 24 hour  Intake 240 ml  Output 350 ml  Net -110 ml   Filed Weights   06/18/20 1944 06/18/20 2305  Weight: 76.2 kg 66.4 kg    Examination:  Constitutional: NAD, AAOx3, ery thin HEENT: conjunctivae and lids normal, EOMI CV: No cyanosis.   RESP: normal respiratory effort, on RA Extremities: No effusions, edema in BLE SKIN: warm, dry Neuro: II - XII grossly intact.   Psych: Normal mood and affect.  Appropriate judgement and reason   DVT prophylaxis: Lovenox Code Status: Full Family Communication: Wife updated at bedside today Disposition Plan:  Status is: Inpatient  Dispo: The patient is from: Home              Anticipated d/c is to: SNF              Patient currently is medically stable.   Difficult to place patient No              Level of care: Med-Surg  All the records are reviewed and case discussed with Care Management/Social Worker. Management plans discussed with the patient, nursing and they are in agreement.  Consultants:   Orthopedic surgery  Procedures:  Antimicrobials:   Data Reviewed: I  have personally reviewed following labs and imaging studies  CBC: Recent Labs  Lab 06/19/20 2029 06/20/20 0532 06/23/20 0532 06/25/20 0629 06/26/20 0445  WBC 14.6* 11.7* 10.6* 9.8 8.7  HGB 12.4* 12.5* 12.5* 11.4* 11.4*  HCT 37.7* 35.5* 36.2* 33.1* 33.3*  MCV 94.3 91.0 91.4 90.7 90.7  PLT 311 301 382 428* 474*   Basic Metabolic Panel: Recent Labs  Lab 06/19/20 2029 06/20/20 0532 06/23/20 0532 06/25/20 0629 06/26/20 0445  NA  --  133* 137 134* 134*  K  --  4.0 4.6 4.4 4.2  CL  --  104 105 103 103  CO2  --  21* 26 25 25   GLUCOSE  --  118* 100* 103* 97  BUN  --  19 36* 34* 35*  CREATININE 0.84 0.87 0.91 0.80 0.91  CALCIUM  --  8.3* 8.4* 8.4*  8.8*  MG  --   --   --  2.2 2.2   GFR: Estimated Creatinine Clearance: 68.9 mL/min (by C-G formula based on SCr of 0.91 mg/dL). Liver Function Tests: No results for input(s): AST, ALT, ALKPHOS, BILITOT, PROT, ALBUMIN in the last 168 hours. No results for input(s): LIPASE, AMYLASE in the last 168 hours. No results for input(s): AMMONIA in the last 168 hours. Coagulation Profile: No results for input(s): INR, PROTIME in the last 168 hours. Cardiac Enzymes: No results for input(s): CKTOTAL, CKMB, CKMBINDEX, TROPONINI in the last 168 hours. BNP (last 3 results) No results for input(s): PROBNP in the last 8760 hours. HbA1C: No results for input(s): HGBA1C in the last 72 hours. CBG: No results for input(s): GLUCAP in the last 168 hours. Lipid Profile: No results for input(s): CHOL, HDL, LDLCALC, TRIG, CHOLHDL, LDLDIRECT in the last 72 hours. Thyroid Function Tests: No results for input(s): TSH, T4TOTAL, FREET4, T3FREE, THYROIDAB in the last 72 hours. Anemia Panel: No results for input(s): VITAMINB12, FOLATE, FERRITIN, TIBC, IRON, RETICCTPCT in the last 72 hours. Sepsis Labs: No results for input(s): PROCALCITON, LATICACIDVEN in the last 168 hours.  Recent Results (from the past 240 hour(s))  Resp Panel by RT-PCR (Flu A&B, Covid) Nasopharyngeal Swab     Status: None   Collection Time: 06/18/20  8:55 PM   Specimen: Nasopharyngeal Swab; Nasopharyngeal(NP) swabs in vial transport medium  Result Value Ref Range Status   SARS Coronavirus 2 by RT PCR NEGATIVE NEGATIVE Final    Comment: (NOTE) SARS-CoV-2 target nucleic acids are NOT DETECTED.  The SARS-CoV-2 RNA is generally detectable in upper respiratory specimens during the acute phase of infection. The lowest concentration of SARS-CoV-2 viral copies this assay can detect is 138 copies/mL. A negative result does not preclude SARS-Cov-2 infection and should not be used as the sole basis for treatment or other patient management  decisions. A negative result may occur with  improper specimen collection/handling, submission of specimen other than nasopharyngeal swab, presence of viral mutation(s) within the areas targeted by this assay, and inadequate number of viral copies(<138 copies/mL). A negative result must be combined with clinical observations, patient history, and epidemiological information. The expected result is Negative.  Fact Sheet for Patients:  08/18/20  Fact Sheet for Healthcare Providers:  BloggerCourse.com  This test is no t yet approved or cleared by the [employer] FDA and  has been authorized for detection and/or diagnosis of SARS-CoV-2 by FDA under an Emergency Use Authorization (EUA). This EUA will remain  in effect (meaning this test can be used) for the duration of the COVID-19 declaration under Section 564(b)(1)  of the Act, 21 U.S.C.section 360bbb-3(b)(1), unless the authorization is terminated  or revoked sooner.       Influenza A by PCR NEGATIVE NEGATIVE Final   Influenza B by PCR NEGATIVE NEGATIVE Final    Comment: (NOTE) The Xpert Xpress SARS-CoV-2/FLU/RSV plus assay is intended as an aid in the diagnosis of influenza from Nasopharyngeal swab specimens and should not be used as a sole basis for treatment. Nasal washings and aspirates are unacceptable for Xpert Xpress SARS-CoV-2/FLU/RSV testing.  Fact Sheet for Patients: BloggerCourse.com  Fact Sheet for Healthcare Providers: SeriousBroker.it  This test is not yet approved or cleared by the [place] FDA and has been authorized for detection and/or diagnosis of SARS-CoV-2 by FDA under an Emergency Use Authorization (EUA). This EUA will remain in effect (meaning this test can be used) for the duration of the COVID-19 declaration under Section 564(b)(1) of the Act, 21 U.S.C. section 360bbb-3(b)(1), unless the  authorization is terminated or revoked.  Performed at [facility], [address], [place], [place] [address]   Surgical PCR screen     Status: None   Collection Time: 06/19/20  5:14 AM   Specimen: Nasal Mucosa; Nasal Swab  Result Value Ref Range Status   MRSA, PCR NEGATIVE NEGATIVE Final   Staphylococcus aureus NEGATIVE NEGATIVE Final    Comment: (NOTE) The Xpert SA Assay (FDA approved for NASAL specimens in patients 15 years of age and older), is one component of a comprehensive surveillance program. It is not intended to diagnose infection nor to guide or monitor treatment. Performed at [facility], [address] [place], [place] [address]      Radiology Studies: No results found.  Scheduled Meds: . amitriptyline  300 mg Oral QHS  . bisacodyl  10 mg Rectal Once  . docusate sodium  100 mg Oral BID  . enoxaparin (LOVENOX) injection  40 mg Subcutaneous Q24H  . folic acid  1 mg Oral Daily  . multivitamin with minerals  1 tablet Oral Daily  . pantoprazole  40 mg Oral Daily  . thiamine  100 mg Oral Daily   Or  . thiamine  100 mg Intravenous Daily   Continuous Infusions: . sodium chloride Stopped (06/20/20 0302)     LOS: 8 days    Darlin Priestly, MD [facility]  If 7PM-7AM, please contact night-coverage [email or web address]  06/26/2020, 4:56 PM

## 2020-06-26 NOTE — Progress Notes (Signed)
  Subjective: 7 Days Post-Op Procedure(s) (LRB): TOTAL HIP ARTHROPLASTY (Left) Patient reports pain as mild.   Patient is well, and has had no acute complaints or problems Plan is to go Rehab after hospital stay. Negative for chest pain and shortness of breath Fever: no Gastrointestinal: negative for nausea and vomiting.   Objective: Vital signs in last 24 hours: Temp:  [97.5 F (36.4 C)-98.6 F (37 C)] 97.6 F (36.4 C) (03/18 0349) Pulse Rate:  [78-86] 84 (03/18 0349) Resp:  [17-20] 20 (03/18 0349) BP: (94-142)/(57-80) 107/57 (03/18 0349) SpO2:  [95 %-98 %] 97 % (03/18 0349)  Intake/Output from previous day:  Intake/Output Summary (Last 24 hours) at 06/26/2020 0759 Last data filed at 06/25/2020 2300 Gross per 24 hour  Intake 0 ml  Output 650 ml  Net -650 ml    Intake/Output this shift: No intake/output data recorded.  Labs: Recent Labs    06/25/20 0629 06/26/20 0445  HGB 11.4* 11.4*   Recent Labs    06/25/20 0629 06/26/20 0445  WBC 9.8 8.7  RBC 3.65* 3.67*  HCT 33.1* 33.3*  PLT 428* 474*   Recent Labs    06/25/20 0629 06/26/20 0445  NA 134* 134*  K 4.4 4.2  CL 103 103  CO2 25 25  BUN 34* 35*  CREATININE 0.80 0.91  GLUCOSE 103* 97  CALCIUM 8.4* 8.8*   No results for input(s): LABPT, INR in the last 72 hours.   EXAM General - Patient is Alert, Appropriate and Oriented Extremity - Neurovascular intact Dorsiflexion/Plantar flexion intact Compartment soft;  Dressing/Incision -Pravena in place and working, no drainage Motor Function - intact, moving foot and toes well on exam.    Assessment/Plan: 7 Days Post-Op Procedure(s) (LRB): TOTAL HIP ARTHROPLASTY (Left) Active Problems:   Tobacco use   Femur fracture, left (HCC)   Alcohol abuse   Closed fracture of left hip (HCC)   Fall  Estimated body mass index is 16.92 kg/m as calculated from the following:   Height as of this encounter: 6\' 6"  (1.981 m).   Weight as of this encounter: 66.4  kg. Advance diet Up with therapy Labs and VSS Pain well controlled CM to assist with discharge to SNF  Lovenox 40 mg subcu daily x14 days at discharge TED hose bilateral lower extremity x6 weeks Please remove provena negative pressure dressing on 07/01/2020 and apply honey comb dressing. Keep dressing clean and dry at all times. Follow up with [facility] 2 weeks post op (7 days)  DVT Prophylaxis - Lovenox, Ted hose and SCDs Weight-Bearing as tolerated to left leg  07/03/2020, PA-C [facility] Orthopaedic Surgery 06/26/2020, 7:59 AM

## 2020-06-26 NOTE — TOC Progression Note (Addendum)
Transition of Care [facility]) - Progression Note    Patient Details  Name: Charles Ray MRN: [id number] Date of Birth: [birth year]  [facility] of Care [facility]) CM/SW Contact  Margarito Liner, LCSW Phone Number: 06/26/2020, 8:58 AM  Clinical Narrative:  PASARR still pending. [facility] has a bed available for patient. Asked admissions coordinator to start insurance authorization. She will relay to business office. [facility] unable to accept without PAS[facility] and do not take weekend admissions.  9:13 am: PASARR obtained: [id number] F. Expires 07/26/20. [facility] admissions coordinator aware.   Expected Discharge Plan and Services                                                 Social Determinants of Health (SDOH) Interventions    Readmission Risk Interventions No flowsheet data found.

## 2020-06-26 NOTE — Progress Notes (Signed)
Physical Therapy Treatment Patient Details Name: Charles Ray MRN: [id number] DOB: [birth year] Today's Date: 06/26/2020    History of Present Illness Pt is a 70 y/o M admitted on 06/18/20 with c/c of L hip pain following a fall after tripping on the sidewalk. 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 Rosita Kea on 06/19/20. Pt is now WBAT LLE. PMH: depression, alcohol abuse, tobacco abuse    PT Comments    OOB with ease and rail today.  Stood with min a x 1, continues to pull up on walker vs pushing on bed with assist to brace walker.  Standing ex as below.  He made significant gains with mobility today and is able to progress gait 30' then 15' after short seated rest break.  He needs cues to reach back for chair before sitting which he does not do.  Has difficulty navigating with walker but some can be attributed to weight of wound vac on front bar.  Need cues for positioning.  SNF remains appropriate for discharge.   Follow Up Recommendations  SNF     Equipment Recommendations  None recommended by PT    Recommendations for Other Services       Precautions / Restrictions Precautions Precautions: Fall;Anterior Hip Precaution Booklet Issued: Yes (comment) Precaution Comments: hip (anterior approach) Restrictions Weight Bearing Restrictions: Yes LLE Weight Bearing: Weight bearing as tolerated    Mobility  Bed Mobility Overal bed mobility: Modified Independent             General bed mobility comments: uses rail but no physical assist today    Transfers Overall transfer level: Needs assistance Equipment used: Rolling walker (2 wheeled) Transfers: Sit to/from Stand Sit to Stand: From elevated surface;Min assist            Ambulation/Gait Ambulation/Gait assistance: Min assist Gait Distance (Feet): 30 Feet Assistive device: Rolling walker (2 wheeled) Gait Pattern/deviations: Step-to  pattern;Decreased step length - right;Decreased step length - left Gait velocity: decreased   General Gait Details: 47' 15' after seated rest   Stairs             Wheelchair Mobility    Modified Rankin (Stroke Patients Only)       Balance Overall balance assessment: Needs assistance Sitting-balance support: No upper extremity supported;Feet supported Sitting balance-Leahy Scale: Good     Standing balance support: Bilateral upper extremity supported Standing balance-Leahy Scale: Fair Standing balance comment: requires BUE support on RW, but has difficulty holding walker in correct position                            Cognition Arousal/Alertness: Awake/alert Behavior During Therapy: WFL for tasks assessed/performed;Flat affect Overall Cognitive Status: Within Functional Limits for tasks assessed                                 General Comments: A&O x 4, flat affect      Exercises Other Exercises Other Exercises: standing ex for SLR, marches and heel raises x 10    General Comments        Pertinent Vitals/Pain Pain Assessment: 0-10 Pain Score: 2  Pain Location: L hip with WBing Pain Descriptors / Indicators: Sore Pain Intervention(s): Limited activity within patient's tolerance;Monitored during session;Repositioned    Home Living  Prior Function            PT Goals (current goals can now be found in the care plan section) Progress towards PT goals: Progressing toward goals    Frequency    BID      PT Plan Current plan remains appropriate    Co-evaluation              AM-PAC PT "6 Clicks" Mobility   Outcome Measure  Help needed turning from your back to your side while in a flat bed without using bedrails?: None Help needed moving from lying on your back to sitting on the side of a flat bed without using bedrails?: A Little Help needed moving to and from a bed to a chair (including  a wheelchair)?: A Little Help needed standing up from a chair using your arms (e.g., wheelchair or bedside chair)?: A Little Help needed to walk in hospital room?: A Little Help needed climbing 3-5 steps with a railing? : A Lot 6 Click Score: 18    End of Session Equipment Utilized During Treatment: Gait belt Activity Tolerance: Patient tolerated treatment well Patient left: in chair;with call bell/phone within reach;with chair alarm set;with family/visitor present Nurse Communication: Mobility status PT Visit Diagnosis: Unsteadiness on feet (R26.81);Muscle weakness (generalized) (M62.81);Difficulty in walking, not elsewhere classified (R26.2);Pain;Repeated falls (R29.6) Pain - Right/Left: Left Pain - part of body: Hip     Time: 1610-9604 PT Time Calculation (min) (ACUTE ONLY): 14 min  Charges:  $Gait Training: 8-22 mins                    Danielle Dess, PTA 06/26/20, 9:21 AM

## 2020-06-26 NOTE — TOC Progression Note (Signed)
Transition of Care [facility]) - Progression Note    Patient Details  Name: Charles Ray MRN: [id number] Date of Birth: 06/28/1950  Transition of Care [facility]) CM/SW Contact  Liliana Cline, LCSW Phone Number: 06/26/2020, 3:27 PM  Clinical Narrative:   CSW reaced out to [place] at [employer]. She reported they still do not have insurance authorization for this patient. She reported patient will have to come on Monday as they cannot take patients over the weekend due to staffing issues.         Expected Discharge Plan and Services                                                 Social Determinants of Health (SDOH) Interventions    Readmission Risk Interventions No flowsheet data found.

## 2020-06-27 DIAGNOSIS — S72002A Fracture of unspecified part of neck of left femur, initial encounter for closed fracture: Secondary | ICD-10-CM | POA: Diagnosis not present

## 2020-06-27 NOTE — Progress Notes (Signed)
  Subjective: 8 Days Post-Op Procedure(s) (LRB): TOTAL HIP ARTHROPLASTY (Left) Patient reports pain as mild.   Patient is well, and has had no acute complaints or problems Plan is to go Rehab after hospital stay.  Patient unable to go to SNF this weekend, plan is for discharge to SNF on Monday. Negative for chest pain and shortness of breath Fever: no Gastrointestinal: negative for nausea and vomiting.   Objective: Vital signs in last 24 hours: Temp:  [97.5 F (36.4 C)-98.5 F (36.9 C)] 97.7 F (36.5 C) (03/19 0757) Pulse Rate:  [75-84] 81 (03/19 0757) Resp:  [16-19] 17 (03/19 0757) BP: (115-136)/(66-79) 121/71 (03/19 0757) SpO2:  [95 %-99 %] 95 % (03/19 0757)  Intake/Output from previous day:  Intake/Output Summary (Last 24 hours) at 06/27/2020 0834 Last data filed at 06/27/2020 0300 Gross per 24 hour  Intake 240 ml  Output 400 ml  Net -160 ml    Intake/Output this shift: No intake/output data recorded.  Labs: Recent Labs    06/25/20 0629 06/26/20 0445  HGB 11.4* 11.4*   Recent Labs    06/25/20 0629 06/26/20 0445  WBC 9.8 8.7  RBC 3.65* 3.67*  HCT 33.1* 33.3*  PLT 428* 474*   Recent Labs    06/25/20 0629 06/26/20 0445  NA 134* 134*  K 4.4 4.2  CL 103 103  CO2 25 25  BUN 34* 35*  CREATININE 0.80 0.91  GLUCOSE 103* 97  CALCIUM 8.4* 8.8*   No results for input(s): LABPT, INR in the last 72 hours.   EXAM General - Patient is Alert, Appropriate and Oriented Extremity - Neurovascular intact Sensation intact distally Dorsiflexion/Plantar flexion intact Compartment soft;  Dressing/Incision -Pravena in place and working, no drainage Motor Function - intact, moving foot and toes well on exam.   Assessment/Plan: 8 Days Post-Op Procedure(s) (LRB): TOTAL HIP ARTHROPLASTY (Left) Active Problems:   Tobacco use   Femur fracture, left (HCC)   Alcohol abuse   Closed fracture of left hip (HCC)   Fall  Estimated body mass index is 16.92 kg/m as  calculated from the following:   Height as of this encounter: 6\' 6"  (1.981 m).   Weight as of this encounter: 66.4 kg. Advance diet Up with therapy Labs and VSS Pain well controlled CM to assist with discharge to SNF  Lovenox 40 mg subcu daily x14 days at discharge TED hose bilateral lower extremity x6 weeks Please remove provena negative pressure dressing on 07/01/2020 and apply honey comb dressing. Keep dressing clean and dry at all times. Follow up with [facility] 2 weeks post op  DVT Prophylaxis - Lovenox, Ted hose and SCDs Weight-Bearing as tolerated to left leg  J. 07/03/2020, PA-C [facility] Orthopaedic Surgery 06/27/2020, 8:34 AM

## 2020-06-27 NOTE — Evaluation (Addendum)
Physical Therapy Treatment Patient Details Name: Charles Ray MRN: [id number] DOB: Aug 20, 1950 Today's Date: 06/27/2020   History of Present Illness  Pt is a 70 y/o M admitted on 06/18/20 with c/c of L hip pain following a fall after tripping on the sidewalk. 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 Rosita Kea on 06/19/20. Pt is now WBAT LLE. PMH: depression, alcohol abuse, tobacco abuse  Clinical Impression  Pt was up to walk with help on RW, able to maneuver better and has more control of direction with cues to stand closer.  Pt is also instructed in bed ex to use as a warm up to standing and has voiced understanding to doing them.  Follow along with him BID, planning to see PM for further progression of ex's and gait if able.    Follow Up Recommendations SNF    Equipment Recommendations  None recommended by PT    Recommendations for Other Services       Precautions / Restrictions Precautions Precautions: Fall;Anterior Hip Precaution Booklet Issued: Yes (comment) Precaution Comments: hip (anterior approach) Restrictions Weight Bearing Restrictions: Yes LLE Weight Bearing: Weight bearing as tolerated      Mobility  Bed Mobility Overal bed mobility: Needs Assistance Bed Mobility: Supine to Sit;Sit to Supine     Supine to sit: Min assist Sit to supine: Min assist   General bed mobility comments: min assist to support the effort    Transfers Overall transfer level: Needs assistance Equipment used: Rolling walker (2 wheeled);1 person hand held assist Transfers: Sit to/from Stand Sit to Stand: From elevated surface;Min guard         General transfer comment: cues for hand placement but with bed up could mainly do the power up  Ambulation/Gait Ambulation/Gait assistance: Min assist;Min guard Gait Distance (Feet): 120 Feet (60 x 2) Assistive device: Rolling walker (2 wheeled) Gait  Pattern/deviations: Step-through pattern;Step-to pattern;Decreased stride length Gait velocity: decreased      Stairs            Wheelchair Mobility    Modified Rankin (Stroke Patients Only)       Balance Overall balance assessment: Needs assistance Sitting-balance support: Feet supported Sitting balance-Leahy Scale: Good     Standing balance support: Bilateral upper extremity supported;During functional activity Standing balance-Leahy Scale: Fair Standing balance comment: requires support of walker for pain and in dynamic situations                             Pertinent Vitals/Pain Pain Assessment: 0-10 Pain Score: 6  Pain Location: L hip with WBing Pain Descriptors / Indicators: Operative site guarding Pain Intervention(s): Limited activity within patient's tolerance;Monitored during session;Premedicated before session;Repositioned    Home Living                        Prior Function                 Hand Dominance        Extremity/Trunk Assessment                Communication      Cognition Arousal/Alertness: Awake/alert Behavior During Therapy: WFL for tasks assessed/performed;Flat affect Overall Cognitive Status: Within Functional Limits for tasks assessed  General Comments General comments (skin integrity, edema, etc.): pt is up walking with RW and noted slow but careful progression    Exercises Total Joint Exercises Ankle Circles/Pumps: AAROM;5 reps General Exercises - Lower Extremity Quad Sets: AAROM;10 reps Gluteal Sets: AAROM;10 reps   Assessment/Plan    PT Assessment    PT Problem List         PT Treatment Interventions      PT Goals (Current goals can be found in the Care Plan section)  Acute Rehab PT Goals Patient Stated Goal: go home    Frequency BID   Barriers to discharge        Co-evaluation               AM-PAC PT "6  Clicks" Mobility  Outcome Measure Help needed turning from your back to your side while in a flat bed without using bedrails?: None Help needed moving from lying on your back to sitting on the side of a flat bed without using bedrails?: A Little Help needed moving to and from a bed to a chair (including a wheelchair)?: A Little Help needed standing up from a chair using your arms (e.g., wheelchair or bedside chair)?: A Little Help needed to walk in hospital room?: A Little Help needed climbing 3-5 steps with a railing? : A Little 6 Click Score: 19    End of Session Equipment Utilized During Treatment: Gait belt Activity Tolerance: Patient tolerated treatment well Patient left: in bed;with call bell/phone within reach;with bed alarm set;with family/visitor present Nurse Communication: Mobility status PT Visit Diagnosis: Unsteadiness on feet (R26.81);Muscle weakness (generalized) (M62.81);Difficulty in walking, not elsewhere classified (R26.2);Pain;Repeated falls (R29.6) Pain - Right/Left: Left Pain - part of body: Hip    Time: 9323-5573 PT Time Calculation (min) (ACUTE ONLY): 25 min   Charges:     PT Treatments $Gait Training: 8-22 mins $Therapeutic Exercise: 8-22 mins       Ivar Drape 06/27/2020, 4:05 PM Samul Dada, PT MS Acute Rehab Dept. Number: [facility] [id number] and [facility] [phone]

## 2020-06-27 NOTE — Progress Notes (Signed)
PROGRESS NOTE    Charles Ray  NTI:[id number] DOB: 05/02/1950 DOA: 06/18/2020 PCP: Jerl Mina, MD   Brief Narrative: Taken from H&P. Charles Ray is a 70 y.o. male with medical history significant for depression, alcohol abuse, tobacco abuse, presents to the emergency department for chief concerns of left hip pain after having a mechanical fall.  Denies any presyncope or syncope.  Found to have displaced transcervical fracture of proximal left femur.  Orthopedic was consulted and he underwent successful left total hip replacement on 06/19/2020.  Plan is to go to SNF before returning home-pending bed and insurance authorization.  Subjective: No complaint today.  Pt reported progressing with inpatient PT.       Assessment & Plan:   Active Problems:   Tobacco use   Femur fracture, left (HCC)   Alcohol abuse   Closed fracture of left hip (HCC)   Fall  Acute displaced transcervical fracture of proximal left femur.  Secondary to mechanical fall.  Underwent total hip replacement yesterday. -Continue with pain management. -PT/OT evaluation -SNF placement -Patient will be discharged on Lovenox for 14 days as DVT prophylaxis. TED hose bilateral lower extremity x6 weeks --per ortho, remove provena negative pressure dressing on 07/01/2020 and apply honey comb dressing. Keep dressing clean and dry at all times. --Weight-Bearing as tolerated to left leg  Alcohol abuse.  Patient drinks half a pint daily.  Last drink was on 06/17/2020.  No sign of withdrawal at this time. --no need for more CIWA  Tobacco abuse.  Smokes 1 pack/day -Continue with nicotine patch  History of depression.  No acute concern. -Continue with home dose of amitriptyline at night.  Underweight, BMI 16.9   Objective: Vitals:   06/27/20 0323 06/27/20 0757 06/27/20 1135 06/27/20 1455  BP: 136/73 121/71 109/65 100/74  Pulse: 75 81 83 86  Resp: 19 17 17 17   Temp: (!) 97.5 F (36.4 C) 97.7 F (36.5 C) 97.7  F (36.5 C) 98.1 F (36.7 C)  TempSrc:      SpO2: 98% 95% 95% 95%  Weight:      Height:        Intake/Output Summary (Last 24 hours) at 06/27/2020 1537 Last data filed at 06/27/2020 1018 Gross per 24 hour  Intake 120 ml  Output 400 ml  Net -280 ml   Filed Weights   06/18/20 1944 06/18/20 2305  Weight: 76.2 kg 66.4 kg    Examination:  Constitutional: NAD, AAOx3, very thin HEENT: conjunctivae and lids normal, EOMI CV: No cyanosis.   RESP: normal respiratory effort, on RA Extremities: No effusions, edema in BLE SKIN: warm, dry Neuro: II - XII grossly intact.   Psych: Normal mood and affect.  Appropriate judgement and reason    DVT prophylaxis: Lovenox Code Status: Full Family Communication: Wife updated at bedside today Disposition Plan:  Status is: Inpatient  Dispo: The patient is from: Home              Anticipated d/c is to: SNF              Patient currently is medically stable.   Difficult to place patient No              Level of care: Med-Surg  All the records are reviewed and case discussed with Care Management/Social Worker. Management plans discussed with the patient, nursing and they are in agreement.  Consultants:   Orthopedic surgery  Procedures:  Antimicrobials:   Data Reviewed: I have personally  reviewed following labs and imaging studies  CBC: Recent Labs  Lab 06/23/20 0532 06/25/20 0629 06/26/20 0445  WBC 10.6* 9.8 8.7  HGB 12.5* 11.4* 11.4*  HCT 36.2* 33.1* 33.3*  MCV 91.4 90.7 90.7  PLT 382 428* 474*   Basic Metabolic Panel: Recent Labs  Lab 06/23/20 0532 06/25/20 0629 06/26/20 0445  NA 137 134* 134*  K 4.6 4.4 4.2  CL 105 103 103  CO2 26 25 25   GLUCOSE 100* 103* 97  BUN 36* 34* 35*  CREATININE 0.91 0.80 0.91  CALCIUM 8.4* 8.4* 8.8*  MG  --  2.2 2.2   GFR: Estimated Creatinine Clearance: 68.9 mL/min (by C-G formula based on SCr of 0.91 mg/dL). Liver Function Tests: No results for input(s): AST, ALT, ALKPHOS,  BILITOT, PROT, ALBUMIN in the last 168 hours. No results for input(s): LIPASE, AMYLASE in the last 168 hours. No results for input(s): AMMONIA in the last 168 hours. Coagulation Profile: No results for input(s): INR, PROTIME in the last 168 hours. Cardiac Enzymes: No results for input(s): CKTOTAL, CKMB, CKMBINDEX, TROPONINI in the last 168 hours. BNP (last 3 results) No results for input(s): PROBNP in the last 8760 hours. HbA1C: No results for input(s): HGBA1C in the last 72 hours. CBG: No results for input(s): GLUCAP in the last 168 hours. Lipid Profile: No results for input(s): CHOL, HDL, LDLCALC, TRIG, CHOLHDL, LDLDIRECT in the last 72 hours. Thyroid Function Tests: No results for input(s): TSH, T4TOTAL, FREET4, T3FREE, THYROIDAB in the last 72 hours. Anemia Panel: No results for input(s): VITAMINB12, FOLATE, FERRITIN, TIBC, IRON, RETICCTPCT in the last 72 hours. Sepsis Labs: No results for input(s): PROCALCITON, LATICACIDVEN in the last 168 hours.  Recent Results (from the past 240 hour(s))  Resp Panel by RT-PCR (Flu A&B, Covid) Nasopharyngeal Swab     Status: None   Collection Time: 06/18/20  8:55 PM   Specimen: Nasopharyngeal Swab; Nasopharyngeal(NP) swabs in vial transport medium  Result Value Ref Range Status   SARS Coronavirus 2 by RT PCR NEGATIVE NEGATIVE Final    Comment: (NOTE) SARS-CoV-2 target nucleic acids are NOT DETECTED.  The SARS-CoV-2 RNA is generally detectable in upper respiratory specimens during the acute phase of infection. The lowest concentration of SARS-CoV-2 viral copies this assay can detect is 138 copies/mL. A negative result does not preclude SARS-Cov-2 infection and should not be used as the sole basis for treatment or other patient management decisions. A negative result may occur with  improper specimen collection/handling, submission of specimen other than nasopharyngeal swab, presence of viral mutation(s) within the areas targeted by this  assay, and inadequate number of viral copies(<138 copies/mL). A negative result must be combined with clinical observations, patient history, and epidemiological information. The expected result is Negative.  Fact Sheet for Patients:  08/18/20  Fact Sheet for Healthcare Providers:  BloggerCourse.com  This test is no t yet approved or cleared by the [place] FDA and  has been authorized for detection and/or diagnosis of SARS-CoV-2 by FDA under an Emergency Use Authorization (EUA). This EUA will remain  in effect (meaning this test can be used) for the duration of the COVID-19 declaration under Section 564(b)(1) of the Act, 21 U.S.C.section 360bbb-3(b)(1), unless the authorization is terminated  or revoked sooner.       Influenza A by PCR NEGATIVE NEGATIVE Final   Influenza B by PCR NEGATIVE NEGATIVE Final    Comment: (NOTE) The Xpert Xpress SARS-CoV-2/FLU/RSV plus assay is intended as an aid in the diagnosis  of influenza from Nasopharyngeal swab specimens and should not be used as a sole basis for treatment. Nasal washings and aspirates are unacceptable for Xpert Xpress SARS-CoV-2/FLU/RSV testing.  Fact Sheet for Patients: BloggerCourse.com  Fact Sheet for Healthcare Providers: SeriousBroker.it  This test is not yet approved or cleared by the [place] FDA and has been authorized for detection and/or diagnosis of SARS-CoV-2 by FDA under an Emergency Use Authorization (EUA). This EUA will remain in effect (meaning this test can be used) for the duration of the COVID-19 declaration under Section 564(b)(1) of the Act, 21 U.S.C. section 360bbb-3(b)(1), unless the authorization is terminated or revoked.  Performed at [facility], [address], [place], [place] [address]   Surgical PCR screen     Status: None   Collection Time: 06/19/20  5:14 AM    Specimen: Nasal Mucosa; Nasal Swab  Result Value Ref Range Status   MRSA, PCR NEGATIVE NEGATIVE Final   Staphylococcus aureus NEGATIVE NEGATIVE Final    Comment: (NOTE) The Xpert SA Assay (FDA approved for NASAL specimens in patients 79 years of age and older), is one component of a comprehensive surveillance program. It is not intended to diagnose infection nor to guide or monitor treatment. Performed at [facility], [address], [place], [place] [address]      Radiology Studies: No results found.  Scheduled Meds: . amitriptyline  300 mg Oral QHS  . bisacodyl  10 mg Rectal Once  . docusate sodium  100 mg Oral BID  . enoxaparin (LOVENOX) injection  40 mg Subcutaneous Q24H  . folic acid  1 mg Oral Daily  . multivitamin with minerals  1 tablet Oral Daily  . pantoprazole  40 mg Oral Daily  . thiamine  100 mg Oral Daily   Or  . thiamine  100 mg Intravenous Daily   Continuous Infusions: . sodium chloride Stopped (06/20/20 0302)     LOS: 9 days    Darlin Priestly, MD [facility]  If 7PM-7AM, please contact night-coverage [email or web address]  06/27/2020, 3:37 PM

## 2020-06-27 NOTE — Progress Notes (Signed)
Occupational Therapy Treatment Patient Details Name: Charles Ray MRN: [id number] DOB: 1951-03-09 Today's Date: 06/27/2020    History of present illness Pt is a 70 y/o M admitted on 06/18/20 with c/c of L hip pain following a fall after tripping on the sidewalk. 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 Rosita Kea on 06/19/20. Pt is now WBAT LLE. PMH: depression, alcohol abuse, tobacco abuse   OT comments  Pt. education was provided about A/E use for LE ADLs. Pt. required modA to perform LE ADLs. Pt.'s wife was present. Pt. Continues to benefit from additional A/E training for reinforcement, and carryover. Pt. Could benefit from OT services for ADL training, A/E training, and pt. Education about home modification, and DME. Pt. would benefit from SNF level of care upon discharge, with follow-up OT services.   Follow Up Recommendations  SNF    Equipment Recommendations  Tub/shower seat;3 in 1 bedside commode    Recommendations for Other Services      Precautions / Restrictions Precautions Precautions: Fall;Anterior Hip Precaution Booklet Issued: Yes (comment) Precaution Comments: hip (anterior approach) Restrictions Weight Bearing Restrictions: Yes LLE Weight Bearing: Weight bearing as tolerated       Mobility Bed Mobility Overal bed mobility: Needs Assistance Bed Mobility: Supine to Sit;Sit to Supine     Supine to sit: Min assist Sit to supine: Min assist   General bed mobility comments: staying in bed to ex    Transfers Overall transfer level: Needs assistance Equipment used: Rolling walker (2 wheeled);1 person hand held assist Transfers: Sit to/from Stand Sit to Stand: From elevated surface;Min guard Stand pivot transfers: Min assist;+2 safety/equipment       General transfer comment: deferred    Balance Overall balance assessment: Needs assistance Sitting-balance support: Feet  supported Sitting balance-Leahy Scale: Good                                ADL either performed or assessed with clinical judgement   ADL Overall ADL's : Needs assistance/impaired                     Lower Body Dressing: Moderate assistance                 General ADL Comments: Pt. education was provided about A/E use for LE ADLs     Vision Baseline Vision/History: Wears glasses Wears Glasses: At all times Patient Visual Report: No change from baseline     Perception     Praxis      Cognition Arousal/Alertness: Awake/alert Behavior During Therapy: WFL for tasks assessed/performed;Flat affect Overall Cognitive Status: Within Functional Limits for tasks assessed                                          Exercises    Shoulder Instructions       General Comments pt is up walking with RW and noted slow but careful progression    Pertinent Vitals/ Pain       Pain Assessment: No/denies pain Pain Score: 6  Pain Location: L hip with WBing Pain Descriptors / Indicators: Operative site guarding Pain Intervention(s): Limited activity within patient's tolerance;Premedicated before session;Repositioned  Home Living  Prior Functioning/Environment              Frequency  Min 1X/week        Progress Toward Goals  OT Goals(current goals can now be found in the care plan section)  Progress towards OT goals: Progressing toward goals  Acute Rehab OT Goals Patient Stated Goal: go home  Plan Discharge plan remains appropriate;Frequency remains appropriate    Co-evaluation                 AM-PAC OT "6 Clicks" Daily Activity     Outcome Measure   Help from another person eating meals?: None Help from another person taking care of personal grooming?: A Little Help from another person toileting, which includes using toliet, bedpan, or urinal?: A Lot Help  from another person bathing (including washing, rinsing, drying)?: A Lot Help from another person to put on and taking off regular upper body clothing?: A Little Help from another person to put on and taking off regular lower body clothing?: A Lot 6 Click Score: 16    End of Session Equipment Utilized During Treatment: Rolling walker  OT Visit Diagnosis: Unsteadiness on feet (R26.81);Other abnormalities of gait and mobility (R26.89);Muscle weakness (generalized) (M62.81);History of falling (Z91.81)   Activity Tolerance Patient tolerated treatment well   Patient Left with chair alarm set;with family/visitor present;in bed;with call bell/phone within reach   Nurse Communication          Time: 5427-0623 OT Time Calculation (min): 30 min  Charges: OT General Charges $OT Visit: 1 Visit OT Treatments $Self Care/Home Management : 23-37 mins  [name], MS, OTR/L    [name] 06/27/2020, 4:45 PM

## 2020-06-27 NOTE — Plan of Care (Signed)

## 2020-06-27 NOTE — Progress Notes (Signed)
Physical Therapy Treatment Patient Details Name: Charles Ray MRN: [id number] DOB: Sep 04, 1950 Today's Date: 06/27/2020    History of Present Illness Pt is a 70 y/o M admitted on 06/18/20 with c/c of L hip pain following a fall after tripping on the sidewalk. 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 Rosita Kea on 06/19/20. Pt is now WBAT LLE. PMH: depression, alcohol abuse, tobacco abuse    PT Comments    Pt was seen for mobility but declined, performed bed ex's with assist on LLE and AROM to strengthening RLE.  Pt is still in some minor discomfort, 6/10 pain and is in bed with wife in attendance.  Assisted him to get through ROM on LLE, RLE is comfortable with routine.  Pt agreed to do ex's in his room after he rests, good follow through with PT today.  See tomorrow for further gait and balance skill training.   Follow Up Recommendations  SNF     Equipment Recommendations  None recommended by PT    Recommendations for Other Services       Precautions / Restrictions Precautions Precautions: Fall;Anterior Hip Precaution Booklet Issued: Yes (comment) Precaution Comments: hip (anterior approach) Restrictions Weight Bearing Restrictions: Yes LLE Weight Bearing: Weight bearing as tolerated    Mobility  Bed Mobility Overal bed mobility: Needs Assistance Bed Mobility: Supine to Sit;Sit to Supine     Supine to sit: Min assist Sit to supine: Min assist   General bed mobility comments: staying in bed to ex    Transfers Overall transfer level: Needs assistance Equipment used: Rolling walker (2 wheeled);1 person hand held assist Transfers: Sit to/from Stand Sit to Stand: From elevated surface;Min guard         General transfer comment: deferred  Ambulation/Gait Ambulation/Gait assistance: Min assist;Min guard Gait Distance (Feet): 120 Feet (60 x 2) Assistive device: Rolling walker (2 wheeled) Gait  Pattern/deviations: Step-through pattern;Step-to pattern;Decreased stride length Gait velocity: decreased       Stairs             Wheelchair Mobility    Modified Rankin (Stroke Patients Only)       Balance Overall balance assessment: Needs assistance Sitting-balance support: Feet supported Sitting balance-Leahy Scale: Good     Standing balance support: Bilateral upper extremity supported;During functional activity Standing balance-Leahy Scale: Fair Standing balance comment: requires support of walker for pain and in dynamic situations                            Cognition Arousal/Alertness: Awake/alert Behavior During Therapy: WFL for tasks assessed/performed;Flat affect Overall Cognitive Status: Within Functional Limits for tasks assessed                                        Exercises Total Joint Exercises Ankle Circles/Pumps: AAROM;5 reps General Exercises - Lower Extremity Ankle Circles/Pumps: AROM;5 reps Quad Sets: AROM;10 reps Gluteal Sets: AROM;10 reps Short Arc Quad: AROM;10 reps Heel Slides: AAROM;AROM;10 reps Hip ABduction/ADduction: AROM;AAROM;10 reps Straight Leg Raises: AROM;AAROM;10 reps    General Comments General comments (skin integrity, edema, etc.): pt is up walking with RW and noted slow but careful progression      Pertinent Vitals/Pain Pain Assessment: 0-10 Pain Score: 6  Pain Location: L hip with WBing Pain Descriptors /  Indicators: Operative site guarding Pain Intervention(s): Limited activity within patient's tolerance;Premedicated before session;Repositioned    Home Living                      Prior Function            PT Goals (current goals can now be found in the care plan section) Acute Rehab PT Goals Patient Stated Goal: go home Progress towards PT goals: Progressing toward goals    Frequency    BID      PT Plan Current plan remains appropriate    Co-evaluation               AM-PAC PT "6 Clicks" Mobility   Outcome Measure  Help needed turning from your back to your side while in a flat bed without using bedrails?: None Help needed moving from lying on your back to sitting on the side of a flat bed without using bedrails?: A Little Help needed moving to and from a bed to a chair (including a wheelchair)?: A Little Help needed standing up from a chair using your arms (e.g., wheelchair or bedside chair)?: A Little Help needed to walk in hospital room?: A Little Help needed climbing 3-5 steps with a railing? : A Little 6 Click Score: 19    End of Session Equipment Utilized During Treatment: Gait belt Activity Tolerance: Patient tolerated treatment well Patient left: in bed;with call bell/phone within reach;with bed alarm set;with family/visitor present Nurse Communication: Mobility status PT Visit Diagnosis: Unsteadiness on feet (R26.81);Muscle weakness (generalized) (M62.81);Difficulty in walking, not elsewhere classified (R26.2);Pain;Repeated falls (R29.6) Pain - Right/Left: Left Pain - part of body: Hip     Time: 1520-1539 PT Time Calculation (min) (ACUTE ONLY): 19 min  Charges:  $Gait Training: 8-22 mins $Therapeutic Exercise: 8-22 mins                  Ivar Drape 06/27/2020, 4:13 PM Samul Dada, PT MS Acute Rehab Dept. Number: [facility] [id number] and [facility] [phone]

## 2020-06-28 DIAGNOSIS — S72002A Fracture of unspecified part of neck of left femur, initial encounter for closed fracture: Secondary | ICD-10-CM | POA: Diagnosis not present

## 2020-06-28 LAB — SARS CORONAVIRUS 2 (TAT 6-24 HRS): SARS Coronavirus 2: NEGATIVE

## 2020-06-28 NOTE — Progress Notes (Signed)
Subjective: 9 Days Post-Op Procedure(s) (LRB): TOTAL HIP ARTHROPLASTY (Left) Patient reports pain as mild.   Patient is well, and has had no acute complaints or problems Plan is to go Rehab after hospital stay.  Patient unable to go to SNF this weekend, plan is for discharge to SNF on Monday, 06/29/20 Negative for chest pain and shortness of breath Fever: no Gastrointestinal: negative for nausea and vomiting.   Objective: Vital signs in last 24 hours: Temp:  [97.7 F (36.5 C)-98.2 F (36.8 C)] 98.2 F (36.8 C) (03/20 0538) Pulse Rate:  [82-86] 84 (03/20 0538) Resp:  [16-17] 16 (03/20 0538) BP: (100-127)/(64-76) 127/76 (03/20 0538) SpO2:  [95 %-98 %] 98 % (03/19 1953)  Intake/Output from previous day:  Intake/Output Summary (Last 24 hours) at 06/28/2020 0803 Last data filed at 06/28/2020 0646 Gross per 24 hour  Intake 600 ml  Output 750 ml  Net -150 ml    Intake/Output this shift: No intake/output data recorded.  Labs: Recent Labs    06/26/20 0445  HGB 11.4*   Recent Labs    06/26/20 0445  WBC 8.7  RBC 3.67*  HCT 33.3*  PLT 474*   Recent Labs    06/26/20 0445  NA 134*  K 4.2  CL 103  CO2 25  BUN 35*  CREATININE 0.91  GLUCOSE 97  CALCIUM 8.8*   No results for input(s): LABPT, INR in the last 72 hours.   EXAM General - Patient is Alert, Appropriate and Oriented Extremity - Neurovascular intact Sensation intact distally Dorsiflexion/Plantar flexion intact Compartment soft;  Dressing/Incision -Pravena in place and working, no drainage Motor Function - intact, moving foot and toes well on exam.   Assessment/Plan: 9 Days Post-Op Procedure(s) (LRB): TOTAL HIP ARTHROPLASTY (Left) Active Problems:   Tobacco use   Femur fracture, left (HCC)   Alcohol abuse   Closed fracture of left hip (HCC)   Fall  Estimated body mass index is 16.92 kg/m as calculated from the following:   Height as of this encounter: 6\' 6"  (1.981 m).   Weight as of this  encounter: 66.4 kg. Advance diet Up with therapy Labs and VSS Pain well controlled CM to assist with discharge to SNF on Monday 06/29/20.  Plan is for discharge to [facility] on Monday.  Lovenox 40 mg subcu daily x14 days at discharge TED hose bilateral lower extremity x6 weeks Please remove provena negative pressure dressing on 07/01/2020 and apply honey comb dressing. Keep dressing clean and dry at all times. Follow up with [facility] 2 weeks post op  DVT Prophylaxis - Lovenox, Ted hose and SCDs Weight-Bearing as tolerated to left leg  J. 07/03/2020, PA-C [facility] 06/28/2020, 8:03 AM

## 2020-06-28 NOTE — Plan of Care (Signed)
  Problem: Education: Goal: Knowledge of General Education information will improve Description: Including pain rating scale, medication(s)/side effects and non-pharmacologic comfort measures Outcome: Progressing   Problem: Health Behavior/Discharge Planning: Goal: Ability to manage health-related needs will improve Outcome: Progressing   Problem: Activity: Goal: Risk for activity intolerance will decrease Outcome: Progressing   Problem: Nutrition: Goal: Adequate nutrition will be maintained Outcome: Progressing   Problem: Coping: Goal: Level of anxiety will decrease Outcome: Progressing   Problem: Elimination: Goal: Will not experience complications related to bowel motility Outcome: Progressing Goal: Will not experience complications related to urinary retention Outcome: Progressing   Problem: Pain Managment: Goal: General experience of comfort will improve Outcome: Progressing   Problem: Safety: Goal: Ability to remain free from injury will improve Outcome: Progressing   

## 2020-06-28 NOTE — Progress Notes (Signed)
Physical Therapy Treatment Patient Details Name: Charles Ray MRN: [id number] DOB: 06-Jun-1950 Today's Date: 06/28/2020    History of Present Illness Pt is a 70 y/o M admitted on 06/18/20 with c/c of L hip pain following a fall after tripping on the sidewalk. 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 Rosita Kea on 06/19/20. Pt is now WBAT LLE. PMH: depression, alcohol abuse, tobacco abuse    PT Comments    Pt is able to progress gait 120' and 30' with RW and min assist.  Continues with slow step to gait heavily dependant on walker with decreased balance and hands on assist at all times needed.  Standing ex at EOB with walker and min a x 1.  He chose to return to bed after session today.   Follow Up Recommendations  SNF     Equipment Recommendations  None recommended by PT    Recommendations for Other Services       Precautions / Restrictions Precautions Precautions: Fall;Anterior Hip Precaution Booklet Issued: Yes (comment) Precaution Comments: hip (anterior approach) Restrictions Weight Bearing Restrictions: Yes LLE Weight Bearing: Weight bearing as tolerated    Mobility  Bed Mobility Overal bed mobility: Needs Assistance Bed Mobility: Supine to Sit;Sit to Supine     Supine to sit: Min assist Sit to supine: Min assist        Transfers Overall transfer level: Needs assistance Equipment used: Rolling walker (2 wheeled) Transfers: Sit to/from Stand Sit to Stand: From elevated surface;Min assist            Ambulation/Gait Ambulation/Gait assistance: Min assist;Min guard Gait Distance (Feet): 150 Feet Assistive device: Rolling walker (2 wheeled) Gait Pattern/deviations: Step-through pattern;Step-to pattern;Decreased stride length Gait velocity: decreased   General Gait Details: 120' 30' after seated rest   Stairs             Wheelchair Mobility    Modified Rankin (Stroke  Patients Only)       Balance Overall balance assessment: Needs assistance Sitting-balance support: Feet supported Sitting balance-Leahy Scale: Good     Standing balance support: Bilateral upper extremity supported;During functional activity Standing balance-Leahy Scale: Fair Standing balance comment: requires support of walker for pain and in dynamic situations                            Cognition Arousal/Alertness: Awake/alert Behavior During Therapy: WFL for tasks assessed/performed;Flat affect Overall Cognitive Status: Within Functional Limits for tasks assessed                                        Exercises      General Comments        Pertinent Vitals/Pain Pain Assessment: Faces Faces Pain Scale: Hurts a little bit Pain Location: L hip with WBing Pain Descriptors / Indicators: Operative site guarding Pain Intervention(s): Limited activity within patient's tolerance;Monitored during session;Repositioned    Home Living                      Prior Function            PT Goals (current goals can now be found in the care plan section) Progress towards PT goals: Progressing toward goals    Frequency    BID  PT Plan Current plan remains appropriate    Co-evaluation              AM-PAC PT "6 Clicks" Mobility   Outcome Measure  Help needed turning from your back to your side while in a flat bed without using bedrails?: None Help needed moving from lying on your back to sitting on the side of a flat bed without using bedrails?: A Little Help needed moving to and from a bed to a chair (including a wheelchair)?: A Little Help needed standing up from a chair using your arms (e.g., wheelchair or bedside chair)?: A Little Help needed to walk in hospital room?: A Little Help needed climbing 3-5 steps with a railing? : A Little 6 Click Score: 19    End of Session Equipment Utilized During Treatment: Gait belt    Patient left: in bed;with call bell/phone within reach;with bed alarm set Nurse Communication: Mobility status PT Visit Diagnosis: Unsteadiness on feet (R26.81);Muscle weakness (generalized) (M62.81);Difficulty in walking, not elsewhere classified (R26.2);Pain;Repeated falls (R29.6) Pain - Right/Left: Left Pain - part of body: Hip     Time: 5809-9833 PT Time Calculation (min) (ACUTE ONLY): 18 min  Charges:  $Gait Training: 8-22 mins                    Danielle Dess, PTA 06/28/20, 12:18 PM

## 2020-06-28 NOTE — TOC Progression Note (Signed)
Transition of Care [facility]) - Progression Note    Patient Details  Name: Charles Ray MRN: [id number] Date of Birth: 08/20/1950  Transition of Care [facility]) CM/SW Contact  Liliana Cline, LCSW Phone Number: 06/28/2020, 9:08 AM  Clinical Narrative:    Plan for [facility] [place] tomorrow pending SNF receiving insurance auth. Asked for COVID test.         Expected Discharge Plan and Services                                                 Social Determinants of Health (SDOH) Interventions    Readmission Risk Interventions No flowsheet data found.

## 2020-06-28 NOTE — Progress Notes (Signed)
PROGRESS NOTE    Charles Ray  ZOX:[id number] DOB: [birth year] DOA: 06/18/2020 PCP: Jerl Mina, MD   Brief Narrative: Taken from H&P. Charles Ray is a 70 y.o. male with medical history significant for depression, alcohol abuse, tobacco abuse, presents to the emergency department for chief concerns of left hip pain after having a mechanical fall.  Denies any presyncope or syncope.  Found to have displaced transcervical fracture of proximal left femur.  Orthopedic was consulted and he underwent successful left total hip replacement on 06/19/2020.  Plan is to go to SNF before returning home-pending bed and insurance authorization.  Subjective: Pt reported only having pain if he does too much.  Eating well.  Normal urination and BM.     Assessment & Plan:   Active Problems:   Tobacco use   Femur fracture, left (HCC)   Alcohol abuse   Closed fracture of left hip (HCC)   Fall  Acute displaced transcervical fracture of proximal left femur.  Secondary to mechanical fall.  Underwent total hip replacement yesterday. -Continue with pain management. -PT/OT evaluation -SNF placement -Patient will be discharged on Lovenox for 14 days as DVT prophylaxis. TED hose bilateral lower extremity x6 weeks --per ortho, remove provena negative pressure dressing on 07/01/2020 and apply honey comb dressing. Keep dressing clean and dry at all times. --Weight-Bearing as tolerated to left leg  Alcohol abuse.  Patient drinks half a pint daily.  Last drink was on 06/17/2020.  No sign of withdrawal at this time. --no need for more CIWA  Tobacco abuse.  Smokes 1 pack/day -Continue with nicotine patch  History of depression.  No acute concern. -Continue with home dose of amitriptyline at night.  Underweight, BMI 16.9   Objective: Vitals:   06/27/20 1953 06/28/20 0538 06/28/20 0811 06/28/20 1153  BP: 120/64 127/76 133/76 114/73  Pulse: 82 84 81 78  Resp: 16 16 17 19   Temp: 98.1 F (36.7 C)  98.2 F (36.8 C) 98.7 F (37.1 C) 97.7 F (36.5 C)  TempSrc:  Oral Oral Oral  SpO2: 98%  98% 96%  Weight:      Height:        Intake/Output Summary (Last 24 hours) at 06/28/2020 1451 Last data filed at 06/28/2020 1353 Gross per 24 hour  Intake 1080 ml  Output 750 ml  Net 330 ml   Filed Weights   06/18/20 1944 06/18/20 2305  Weight: 76.2 kg 66.4 kg    Examination:  Constitutional: NAD, AAOx3, very thin HEENT: conjunctivae and lids normal, EOMI CV: No cyanosis.   RESP: normal respiratory effort, on RA Extremities: No effusions, edema in BLE SKIN: warm, dry Neuro: II - XII grossly intact.   Psych: Normal mood and affect.  Appropriate judgement and reason   DVT prophylaxis: Lovenox Code Status: Full Family Communication:  Disposition Plan:  Status is: Inpatient  Dispo: The patient is from: Home              Anticipated d/c is to: SNF              Patient currently is medically stable.   Difficult to place patient No              Level of care: Med-Surg  All the records are reviewed and case discussed with Care Management/Social Worker. Management plans discussed with the patient, nursing and they are in agreement.  Consultants:   Orthopedic surgery  Procedures:  Antimicrobials:   Data Reviewed: I have personally  reviewed following labs and imaging studies  CBC: Recent Labs  Lab 06/23/20 0532 06/25/20 0629 06/26/20 0445  WBC 10.6* 9.8 8.7  HGB 12.5* 11.4* 11.4*  HCT 36.2* 33.1* 33.3*  MCV 91.4 90.7 90.7  PLT 382 428* 474*   Basic Metabolic Panel: Recent Labs  Lab 06/23/20 0532 06/25/20 0629 06/26/20 0445  NA 137 134* 134*  K 4.6 4.4 4.2  CL 105 103 103  CO2 26 25 25   GLUCOSE 100* 103* 97  BUN 36* 34* 35*  CREATININE 0.91 0.80 0.91  CALCIUM 8.4* 8.4* 8.8*  MG  --  2.2 2.2   GFR: Estimated Creatinine Clearance: 68.9 mL/min (by C-G formula based on SCr of 0.91 mg/dL). Liver Function Tests: No results for input(s): AST, ALT, ALKPHOS,  BILITOT, PROT, ALBUMIN in the last 168 hours. No results for input(s): LIPASE, AMYLASE in the last 168 hours. No results for input(s): AMMONIA in the last 168 hours. Coagulation Profile: No results for input(s): INR, PROTIME in the last 168 hours. Cardiac Enzymes: No results for input(s): CKTOTAL, CKMB, CKMBINDEX, TROPONINI in the last 168 hours. BNP (last 3 results) No results for input(s): PROBNP in the last 8760 hours. HbA1C: No results for input(s): HGBA1C in the last 72 hours. CBG: No results for input(s): GLUCAP in the last 168 hours. Lipid Profile: No results for input(s): CHOL, HDL, LDLCALC, TRIG, CHOLHDL, LDLDIRECT in the last 72 hours. Thyroid Function Tests: No results for input(s): TSH, T4TOTAL, FREET4, T3FREE, THYROIDAB in the last 72 hours. Anemia Panel: No results for input(s): VITAMINB12, FOLATE, FERRITIN, TIBC, IRON, RETICCTPCT in the last 72 hours. Sepsis Labs: No results for input(s): PROCALCITON, LATICACIDVEN in the last 168 hours.  Recent Results (from the past 240 hour(s))  Resp Panel by RT-PCR (Flu A&B, Covid) Nasopharyngeal Swab     Status: None   Collection Time: 06/18/20  8:55 PM   Specimen: Nasopharyngeal Swab; Nasopharyngeal(NP) swabs in vial transport medium  Result Value Ref Range Status   SARS Coronavirus 2 by RT PCR NEGATIVE NEGATIVE Final    Comment: (NOTE) SARS-CoV-2 target nucleic acids are NOT DETECTED.  The SARS-CoV-2 RNA is generally detectable in upper respiratory specimens during the acute phase of infection. The lowest concentration of SARS-CoV-2 viral copies this assay can detect is 138 copies/mL. A negative result does not preclude SARS-Cov-2 infection and should not be used as the sole basis for treatment or other patient management decisions. A negative result may occur with  improper specimen collection/handling, submission of specimen other than nasopharyngeal swab, presence of viral mutation(s) within the areas targeted by this  assay, and inadequate number of viral copies(<138 copies/mL). A negative result must be combined with clinical observations, patient history, and epidemiological information. The expected result is Negative.  Fact Sheet for Patients:  08/18/20  Fact Sheet for Healthcare Providers:  BloggerCourse.com  This test is no t yet approved or cleared by the [place] FDA and  has been authorized for detection and/or diagnosis of SARS-CoV-2 by FDA under an Emergency Use Authorization (EUA). This EUA will remain  in effect (meaning this test can be used) for the duration of the COVID-19 declaration under Section 564(b)(1) of the Act, 21 U.S.C.section 360bbb-3(b)(1), unless the authorization is terminated  or revoked sooner.       Influenza A by PCR NEGATIVE NEGATIVE Final   Influenza B by PCR NEGATIVE NEGATIVE Final    Comment: (NOTE) The Xpert Xpress SARS-CoV-2/FLU/RSV plus assay is intended as an aid in the diagnosis  of influenza from Nasopharyngeal swab specimens and should not be used as a sole basis for treatment. Nasal washings and aspirates are unacceptable for Xpert Xpress SARS-CoV-2/FLU/RSV testing.  Fact Sheet for Patients: BloggerCourse.com  Fact Sheet for Healthcare Providers: SeriousBroker.it  This test is not yet approved or cleared by the [place] FDA and has been authorized for detection and/or diagnosis of SARS-CoV-2 by FDA under an Emergency Use Authorization (EUA). This EUA will remain in effect (meaning this test can be used) for the duration of the COVID-19 declaration under Section 564(b)(1) of the Act, 21 U.S.C. section 360bbb-3(b)(1), unless the authorization is terminated or revoked.  Performed at [facility], [address], [place], [place] [address]   Surgical PCR screen     Status: None   Collection Time: 06/19/20  5:14 AM    Specimen: Nasal Mucosa; Nasal Swab  Result Value Ref Range Status   MRSA, PCR NEGATIVE NEGATIVE Final   Staphylococcus aureus NEGATIVE NEGATIVE Final    Comment: (NOTE) The Xpert SA Assay (FDA approved for NASAL specimens in patients 77 years of age and older), is one component of a comprehensive surveillance program. It is not intended to diagnose infection nor to guide or monitor treatment. Performed at [facility], [address] [place], [place] [address]      Radiology Studies: No results found.  Scheduled Meds: . amitriptyline  300 mg Oral QHS  . bisacodyl  10 mg Rectal Once  . docusate sodium  100 mg Oral BID  . enoxaparin (LOVENOX) injection  40 mg Subcutaneous Q24H  . folic acid  1 mg Oral Daily  . multivitamin with minerals  1 tablet Oral Daily  . pantoprazole  40 mg Oral Daily  . thiamine  100 mg Oral Daily   Or  . thiamine  100 mg Intravenous Daily   Continuous Infusions: . sodium chloride Stopped (06/20/20 0302)     LOS: 10 days    Darlin Priestly, MD [facility]  If 7PM-7AM, please contact night-coverage [email or web address]  06/28/2020, 2:51 PM

## 2020-06-29 ENCOUNTER — Encounter: Payer: Self-pay | Admitting: Orthopedic Surgery

## 2020-06-29 DIAGNOSIS — R279 Unspecified lack of coordination: Secondary | ICD-10-CM | POA: Diagnosis not present

## 2020-06-29 DIAGNOSIS — R5381 Other malaise: Secondary | ICD-10-CM | POA: Diagnosis not present

## 2020-06-29 DIAGNOSIS — Z743 Need for continuous supervision: Secondary | ICD-10-CM | POA: Diagnosis not present

## 2020-06-29 DIAGNOSIS — Z4889 Encounter for other specified surgical aftercare: Secondary | ICD-10-CM | POA: Diagnosis not present

## 2020-06-29 DIAGNOSIS — S72002A Fracture of unspecified part of neck of left femur, initial encounter for closed fracture: Secondary | ICD-10-CM | POA: Diagnosis not present

## 2020-06-29 DIAGNOSIS — S72002D Fracture of unspecified part of neck of left femur, subsequent encounter for closed fracture with routine healing: Secondary | ICD-10-CM | POA: Diagnosis not present

## 2020-06-29 DIAGNOSIS — F329 Major depressive disorder, single episode, unspecified: Secondary | ICD-10-CM | POA: Diagnosis not present

## 2020-06-29 MED ORDER — FOLIC ACID 1 MG PO TABS: 1.0000 mg | ORAL_TABLET | Freq: Every day | ORAL | Status: DC

## 2020-06-29 MED ORDER — THIAMINE HCL 100 MG PO TABS: 100.0000 mg | ORAL_TABLET | Freq: Every day | ORAL | Status: DC

## 2020-06-29 NOTE — Progress Notes (Signed)
Subjective: 10 Days Post-Op Procedure(s) (LRB): TOTAL HIP ARTHROPLASTY (Left) Patient reports pain as mild.   Patient is well, and has had no acute complaints or problems Plan is to go Rehab after hospital stay.  Plan is for discharge to SNF today. Negative for chest pain and shortness of breath Fever: no Gastrointestinal: negative for nausea and vomiting.   Objective: Vital signs in last 24 hours: Temp:  [97.7 F (36.5 C)-98.7 F (37.1 C)] 97.9 F (36.6 C) (03/21 0354) Pulse Rate:  [76-87] 76 (03/21 0354) Resp:  [16-20] 16 (03/21 0354) BP: (111-133)/(63-77) 120/68 (03/21 0354) SpO2:  [95 %-99 %] 96 % (03/21 0354)  Intake/Output from previous day:  Intake/Output Summary (Last 24 hours) at 06/29/2020 0737 Last data filed at 06/29/2020 0400 Gross per 24 hour  Intake 960 ml  Output 550 ml  Net 410 ml    Intake/Output this shift: No intake/output data recorded.  Labs: No results for input(s): HGB in the last 72 hours. No results for input(s): WBC, RBC, HCT, PLT in the last 72 hours. No results for input(s): NA, K, CL, CO2, BUN, CREATININE, GLUCOSE, CALCIUM in the last 72 hours. No results for input(s): LABPT, INR in the last 72 hours.   EXAM General - Patient is Alert, Appropriate and Oriented Extremity - Neurovascular intact Sensation intact distally Dorsiflexion/Plantar flexion intact Compartment soft;  Dressing/Incision -Pravena in place and working, no drainage Motor Function - intact, moving foot and toes well on exam.   Assessment/Plan: 10 Days Post-Op Procedure(s) (LRB): TOTAL HIP ARTHROPLASTY (Left) Active Problems:   Tobacco use   Femur fracture, left (HCC)   Alcohol abuse   Closed fracture of left hip (HCC)   Fall  Estimated body mass index is 16.92 kg/m as calculated from the following:   Height as of this encounter: 6\' 6"  (1.981 m).   Weight as of this encounter: 66.4 kg. Advance diet Up with therapy Labs and VSS Pain well controlled Discharge  to SNF today.  Lovenox 40 mg subcu daily x14 days at discharge TED hose bilateral lower extremity x6 weeks Please remove provena negative pressure dressing on 07/01/2020 and apply honey comb dressing. Keep dressing clean and dry at all times. Staples can be removed by SNF on 07/03/20.  Follow-up with [facility] in 4 weeks for repeat x-rays of the left hip.  DVT Prophylaxis - Lovenox, Ted hose and SCDs Weight-Bearing as tolerated to left leg  [facility], PA-C [facility] 06/29/2020, 7:37 AM

## 2020-06-29 NOTE — TOC Transition Note (Signed)
Transition of Care [facility]) - CM/SW Discharge Note   Patient Details  Name: Charles Ray MRN: [id number] Date of Birth: 31-May-1950  [facility] CM/SW Contact:  Margarito Liner, LCSW Phone Number: 06/29/2020, 1:23 PM   Clinical Narrative: Patient has orders to discharge to [facility] today. RN will call report to [phone] (Room 713). EMS transport has been arranged. No further concerns. CSW signing off.    Final next level of care: Skilled Nursing Facility Barriers to Discharge: Barriers Resolved   Patient Goals and CMS Choice     Choice offered to / list presented to : Patient,Spouse  Discharge Placement PASRR number recieved: 06/26/20            Patient chooses bed at: [facility] [place] Patient to be transferred to facility by: EMS Name of family member notified: Maxie Slovacek Patient and family notified of of transfer: 06/29/20  Discharge Plan and Services                                     Social Determinants of Health (SDOH) Interventions     Readmission Risk Interventions No flowsheet data found.

## 2020-06-29 NOTE — Discharge Instructions (Signed)
ANTERIOR APPROACH TOTAL HIP REPLACEMENT POSTOPERATIVE DIRECTIONS   Hip Rehabilitation, Guidelines Following Surgery  The results of a hip operation are greatly improved after range of motion and muscle strengthening exercises. Follow all safety measures which are given to protect your hip. If any of these exercises cause increased pain or swelling in your joint, decrease the amount until you are comfortable again. Then slowly increase the exercises. Call your caregiver if you have problems or questions.   HOME CARE INSTRUCTIONS  Remove items at home which could result in a fall. This includes throw rugs or furniture in walking pathways.   ICE to the affected hip every three hours for 30 minutes at a time and then as needed for pain and swelling.  Continue to use ice on the hip for pain and swelling from surgery. You may notice swelling that will progress down to the foot and ankle.  This is normal after surgery.  Elevate the leg when you are not up walking on it.    Continue to use the breathing machine which will help keep your temperature down.  It is common for your temperature to cycle up and down following surgery, especially at night when you are not up moving around and exerting yourself.  The breathing machine keeps your lungs expanded and your temperature down.  Do not place pillow under knee, focus on keeping the knee straight while resting  DIET You may resume your previous home diet once your are discharged from the hospital.  DRESSING / WOUND CARE / SHOWERING Rehab will change the dressing for you to a honeycomb dressing later this week. Keep your dressing dry with showering.  You can keep it covered and pat dry. Change the surgical dressing daily and reapply a dry dressing each time.  ACTIVITY Walk with your walker as instructed. Use walker as long as suggested by your caregivers. Avoid periods of inactivity such as sitting longer than an hour when not asleep. This helps  prevent blood clots.  You may resume a sexual relationship in one month or when given the OK by your doctor.  You may return to work once you are cleared by your doctor.  Do not drive a car for 6 weeks or until released by you surgeon.  Do not drive while taking narcotics.  WEIGHT BEARING Weight bearing as tolerated with assist device (walker, cane, etc) as directed, use it as long as suggested by your surgeon or therapist, typically at least 4-6 weeks.  POSTOPERATIVE CONSTIPATION PROTOCOL Constipation - defined medically as fewer than three stools per week and severe constipation as less than one stool per week.  One of the most common issues patients have following surgery is constipation.  Even if you have a regular bowel pattern at home, your normal regimen is likely to be disrupted due to multiple reasons following surgery.  Combination of anesthesia, postoperative narcotics, change in appetite and fluid intake all can affect your bowels.  In order to avoid complications following surgery, here are some recommendations in order to help you during your recovery period.  Colace (docusate) - Pick up an over-the-counter form of Colace or another stool softener and take twice a day as long as you are requiring postoperative pain medications.  Take with a full glass of water daily.  If you experience loose stools or diarrhea, hold the colace until you stool forms back up.  If your symptoms do not get better within 1 week or if they get worse, check  with your doctor.  Dulcolax (bisacodyl) - Pick up over-the-counter and take as directed by the product packaging as needed to assist with the movement of your bowels.  Take with a full glass of water.  Use this product as needed if not relieved by Colace only.   MiraLax (polyethylene glycol) - Pick up over-the-counter to have on hand.  MiraLax is a solution that will increase the amount of water in your bowels to assist with bowel movements.  Take as  directed and can mix with a glass of water, juice, soda, coffee, or tea.  Take if you go more than two days without a movement. Do not use MiraLax more than once per day. Call your doctor if you are still constipated or irregular after using this medication for 7 days in a row.  If you continue to have problems with postoperative constipation, please contact the office for further assistance and recommendations.  If you experience "the worst abdominal pain ever" or develop nausea or vomiting, please contact the office immediatly for further recommendations for treatment.  ITCHING  If you experience itching with your medications, try taking only a single pain pill, or even half a pain pill at a time.  You can also use Benadryl over the counter for itching or also to help with sleep.   TED HOSE STOCKINGS Wear the elastic stockings on both legs for three weeks following surgery during the day but you may remove then at night for sleeping.  MEDICATIONS See your medication summary on the "After Visit Summary" that the nursing staff will review with you prior to discharge.  You may have some home medications which will be placed on hold until you complete the course of blood thinner medication.  It is important for you to complete the blood thinner medication as prescribed by your surgeon.  Continue your approved medications as instructed at time of discharge.  PRECAUTIONS If you experience chest pain or shortness of breath - call 911 immediately for transfer to the hospital emergency department.  If you develop a fever greater that 101 F, purulent drainage from wound, increased redness or drainage from wound, foul odor from the wound/dressing, or calf pain - CONTACT YOUR SURGEON.                                                   FOLLOW-UP APPOINTMENTS Make sure you keep all of your appointments after your operation with your surgeon and caregivers. You should call the office at the above phone number  and make an appointment.  RANGE OF MOTION AND STRENGTHENING EXERCISES  These exercises are designed to help you keep full movement of your hip joint. Follow your caregiver's or physical therapist's instructions. Perform all exercises about fifteen times, three times per day or as directed. Exercise both hips, even if you have had only one joint replacement. These exercises can be done on a training (exercise) mat, on the floor, on a table or on a bed. Use whatever works the best and is most comfortable for you. Use music or television while you are exercising so that the exercises are a pleasant break in your day. This will make your life better with the exercises acting as a break in routine you can look forward to.  Lying on your back, slowly slide your foot toward your buttocks,  raising your knee up off the floor. Then slowly slide your foot back down until your leg is straight again.  Lying on your back spread your legs as far apart as you can without causing discomfort.  Lying on your side, raise your upper leg and foot straight up from the floor as far as is comfortable. Slowly lower the leg and repeat.  Lying on your back, tighten up the muscle in the front of your thigh (quadriceps muscles). You can do this by keeping your leg straight and trying to raise your heel off the floor. This helps strengthen the largest muscle supporting your knee.  Lying on your back, tighten up the muscles of your buttocks both with the legs straight and with the knee bent at a comfortable angle while keeping your heel on the floor.   IF YOU ARE TRANSFERRED TO A SKILLED REHAB FACILITY If the patient is transferred to a skilled rehab facility following release from the hospital, a list of the current medications will be sent to the facility for the patient to continue.  When discharged from the skilled rehab facility, please have the facility set up the patient's Home Health Physical Therapy prior to being released.  Also, the skilled facility will be responsible for providing the patient with their medications at time of release from the facility to include their pain medication, the muscle relaxants, and their blood thinner medication. If the patient is still at the rehab facility at time of the two week follow up appointment, the skilled rehab facility will also need to assist the patient in arranging follow up appointment in our office and any transportation needs.  MAKE SURE YOU:  Understand these instructions.  Get help right away if you are not doing well or get worse.    Pick up stool softner and laxative for home use following surgery while on pain medications. Do not submerge incision under water. Please use good hand washing techniques while changing dressing each day. May shower starting three days after surgery. Please use a clean towel to pat the incision dry following showers. Continue to use ice for pain and swelling after surgery. Do not use any lotions or creams on the incision until instructed by your surgeon.

## 2020-06-29 NOTE — Plan of Care (Signed)
No pravena in room. As patient has already been on the wound vac for 10 days, and has no drainge for the last 3 days, may d/c and apply honeycomb dressing at this time. Nursing notified.

## 2020-06-29 NOTE — Plan of Care (Signed)
Discussed with patient plan of care for the evening, pain management and wound vac with some teach back displayed.

## 2020-06-29 NOTE — Progress Notes (Signed)
Physical Therapy Treatment Patient Details Name: Charles Ray MRN: [id number] DOB: 1950/12/18 Today's Date: 06/29/2020    History of Present Illness Pt is a 70 y/o M admitted on 06/18/20 with c/c of L hip pain following a fall after tripping on the sidewalk. 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 Rosita Kea on 06/19/20. Pt is now WBAT LLE. PMH: depression, alcohol abuse, tobacco abuse    PT Comments    OOB with ease today.  Stood and was able to self initiate increase in ambulation distance to 150' with RW and min a x 1.  L hand does seem to slip off handle bar at times with cues to correct.  Overall progressing well but continues to require +1 assist with increased fall risk.  Wife remains unsafe to walk with pt at this time and SNF is appropriate for discharge.  Global weakness remains with dec gait quality including decreased step height and length and flexed posture which all increase fall risk.     Follow Up Recommendations  SNF     Equipment Recommendations  None recommended by PT    Recommendations for Other Services       Precautions / Restrictions Precautions Precautions: Fall;Anterior Hip Precaution Booklet Issued: Yes (comment) Precaution Comments: hip (anterior approach) Restrictions Weight Bearing Restrictions: Yes LLE Weight Bearing: Weight bearing as tolerated    Mobility  Bed Mobility Overal bed mobility: Needs Assistance Bed Mobility: Supine to Sit     Supine to sit: Supervision          Transfers Overall transfer level: Needs assistance Equipment used: Rolling walker (2 wheeled) Transfers: Sit to/from Stand Sit to Stand: From elevated surface;Min assist            Ambulation/Gait Ambulation/Gait assistance: Min assist;Min guard Gait Distance (Feet): 150 Feet Assistive device: Rolling walker (2 wheeled) Gait Pattern/deviations: Step-through pattern;Decreased step  length - right;Decreased step length - left;Trunk flexed Gait velocity: decreased   General Gait Details: no seated rest needed today   Stairs             Wheelchair Mobility    Modified Rankin (Stroke Patients Only)       Balance Overall balance assessment: Needs assistance Sitting-balance support: Feet supported Sitting balance-Leahy Scale: Good     Standing balance support: Bilateral upper extremity supported;During functional activity Standing balance-Leahy Scale: Fair Standing balance comment: requires support of walker for pain and in dynamic situations                            Cognition Arousal/Alertness: Awake/alert Behavior During Therapy: WFL for tasks assessed/performed;Flat affect Overall Cognitive Status: Within Functional Limits for tasks assessed                                        Exercises      General Comments        Pertinent Vitals/Pain Pain Assessment: Faces Faces Pain Scale: Hurts little more Pain Location: L hip with WBing Pain Descriptors / Indicators: Sore Pain Intervention(s): Limited activity within patient's tolerance;Monitored during session;Repositioned    Home Living                      Prior Function  PT Goals (current goals can now be found in the care plan section) Progress towards PT goals: Progressing toward goals    Frequency    BID      PT Plan Current plan remains appropriate    Co-evaluation              AM-PAC PT "6 Clicks" Mobility   Outcome Measure  Help needed turning from your back to your side while in a flat bed without using bedrails?: None Help needed moving from lying on your back to sitting on the side of a flat bed without using bedrails?: A Little Help needed moving to and from a bed to a chair (including a wheelchair)?: A Little Help needed standing up from a chair using your arms (e.g., wheelchair or bedside chair)?: A  Little Help needed to walk in hospital room?: A Little Help needed climbing 3-5 steps with a railing? : A Little 6 Click Score: 19    End of Session Equipment Utilized During Treatment: Gait belt   Patient left: in bed;with call bell/phone within reach;with bed alarm set Nurse Communication: Mobility status PT Visit Diagnosis: Unsteadiness on feet (R26.81);Muscle weakness (generalized) (M62.81);Difficulty in walking, not elsewhere classified (R26.2);Pain;Repeated falls (R29.6) Pain - Right/Left: Left Pain - part of body: Hip     Time: 0827-0839 PT Time Calculation (min) (ACUTE ONLY): 12 min  Charges:  $Gait Training: 8-22 mins                    Danielle Dess, PTA 06/29/20, 9:47 AM

## 2020-06-29 NOTE — Care Management Important Message (Signed)
Important Message  Patient Details  Name: Charles Ray MRN: [id number] Date of Birth: 1951/04/01   Medicare Important Message Given:  Yes     [name] A [name] 06/29/2020, 11:13 AM

## 2020-06-29 NOTE — Discharge Summary (Signed)
Physician Discharge Summary   Charles Ray  male DOB: 1950/11/27  OZH:[id number]  PCP: Jerl Mina, MD  Admit date: 06/18/2020 Discharge date: 06/29/2020  Admitted From: home Disposition:  SNF CODE STATUS: Full code  Discharge Instructions    No wound care   Complete by: As directed    Please remove provena negative pressure dressing on 07/01/2020 and apply honey comb dressing. Keep dressing clean and dry at all times.  Staples can be removed by SNF on 07/03/20.   Follow-up with [facility] in 4 weeks for repeat x-rays of the left hip. - -      30 Day Unplanned Readmission Risk Score   Flowsheet Row ED to Hosp-Admission (Current) from 06/18/2020 in [facility] (1A)  30 Day Unplanned Readmission Risk Score (%) 11.07 Filed at 06/29/2020 1200     This score is the patient's risk of an unplanned readmission within 30 days of being discharged (0 -100%). The score is based on dignosis, age, lab data, medications, orders, and past utilization.   Low:  0-14.9   Medium: 15-21.9   High: 22-29.9   Extreme: 30 and above         Hospital Course:  For full details, please see H&P, progress notes, consult notes and ancillary notes.  Briefly,  Charles Luckadoo Meltonis a 70 y.o.malewith medical history significant fordepression, alcohol abuse, tobacco abuse, presents to the emergency department for chief concerns of left hip pain after having a mechanical fall.  Denies any presyncope or syncope.  Found to have displaced transcervical fracture of proximal left femur.  Orthopedic was consulted and he underwent successful left total hip replacement on 06/19/2020.  Acute displaced transcervical fracture of proximal left femur S/p hip replacement on 06/19/20 Routine healing.   Patient will be discharged on Lovenox for 14 days as DVT prophylaxis. TED hose bilateral lower extremity x6 weeks Per ortho, remove provena negative pressure dressing on  07/01/2020 and apply honey comb dressing.  Keep dressing clean and dry at all times. Weight-Bearing as tolerated to leftleg  Alcohol abuse.   Patient drinks half a pint daily.  Last drink was on 06/17/2020.  No sign of withdrawal during hospitalization.  Tobacco abuse.   Smokes 1 pack/day Continue with nicotine patch  History of depression.   No acute concern. Continue with home dose of amitriptyline at night.  Underweight, BMI 16.9   Discharge Diagnoses:  Active Problems:   Tobacco use   Femur fracture, left (HCC)   Alcohol abuse   Closed fracture of left hip [facility])   Fall    Discharge Instructions:  Allergies as of 06/29/2020   No Known Allergies     Medication List    STOP taking these medications   fluticasone 50 MCG/ACT nasal spray Commonly known as: FLONASE     TAKE these medications   albuterol 108 (90 Base) MCG/ACT inhaler Commonly known as: VENTOLIN HFA TWO PUFFS EVERY 4 HOURS AS NEEDED FOR WHEEZE AND COUGH.   amitriptyline 150 MG tablet Commonly known as: ELAVIL Take 2 tablets by mouth at bedtime.   enoxaparin 40 MG/0.4ML injection Commonly known as: LOVENOX Inject 0.4 mLs (40 mg total) into the skin daily.   folic acid 1 MG tablet Commonly known as: FOLVITE Take 1 tablet (1 mg total) by mouth daily. Start taking on: June 30, 2020   HYDROcodone-acetaminophen 5-325 MG tablet Commonly known as: NORCO/VICODIN Take 1-2 tablets by mouth every 6 (six) hours as needed for moderate  pain.   multivitamin capsule Take 1 capsule by mouth daily.   thiamine 100 MG tablet Take 1 tablet (100 mg total) by mouth daily. Start taking on: June 30, 2020        Follow-up Information    Wilferd, Ritson, PA-C Follow up.   Specialties: Orthopedic Surgery, Emergency Medicine Why: Staples removed by SNF on 07/03/20.  Follow-up in office in 4 weeks. Contact information: [address] [place] [address] [phone]        Jerl Mina, MD.  Schedule an appointment as soon as possible for a visit in 1 week(s).   Specialty: Family Medicine Contact information: [address] [facility] [place] [address] [phone]               No Known Allergies   The results of significant diagnostics from this hospitalization (including imaging, microbiology, ancillary and laboratory) are listed below for reference.   Consultations:   Procedures/Studies: DG Chest 1 View  Result Date: 06/18/2020 CLINICAL DATA:  Pain status post fall. EXAM: CHEST  1 VIEW COMPARISON:  March 13, 2018 FINDINGS: The heart size and mediastinal contours are within normal limits. Both lungs are clear. The visualized skeletal structures are unremarkable. Aortic calcifications are noted. Emphysematous changes are noted. There are old healed left-sided rib fractures. IMPRESSION: No active disease. Electronically Signed   By: Katherine Mantle M.D.   On: 06/18/2020 20:34   DG HIP OPERATIVE UNILAT WITH PELVIS LEFT  Result Date: 06/19/2020 CLINICAL DATA:  Left hip arthroplasty. EXAM: OPERATIVE LEFT HIP (WITH PELVIS IF PERFORMED) TECHNIQUE: Fluoroscopic spot image(s) were submitted for interpretation post-operatively. COMPARISON:  Preoperative radiographs yesterday. FINDINGS: Two fluoroscopic spot views of the left hip obtained in the operating room. Interval left hip arthroplasty. Fluoroscopy time 12 seconds. Dose not provided. IMPRESSION: Procedural fluoroscopy during left hip arthroplasty. Electronically Signed   By: Narda Rutherford M.D.   On: 06/19/2020 19:02   DG HIP UNILAT W OR W/O PELVIS 2-3 VIEWS LEFT  Result Date: 06/19/2020 CLINICAL DATA:  Postop. EXAM: DG HIP (WITH OR WITHOUT PELVIS) 2-3V LEFT COMPARISON:  Preoperative radiograph yesterday. FINDINGS: Left hip arthroplasty in expected alignment. No periprosthetic lucency or fracture. Recent postsurgical change includes air and edema in the soft tissues and lateral skin staples.  IMPRESSION: Left hip arthroplasty without immediate postoperative complication. Electronically Signed   By: Narda Rutherford M.D.   On: 06/19/2020 19:00   DG Hip Unilat W or Wo Pelvis 2-3 Views Left  Result Date: 06/18/2020 CLINICAL DATA:  Pain EXAM: DG HIP (WITH OR WITHOUT PELVIS) 2-3V LEFT COMPARISON:  None. FINDINGS: There is an acute displaced transcervical fracture of the proximal left femur. There is no dislocation. There is osteopenia. There are no significant degenerative changes. IMPRESSION: Acute displaced transcervical fracture of the proximal left femur. Electronically Signed   By: Katherine Mantle M.D.   On: 06/18/2020 20:28      Labs: BNP (last 3 results) No results for input(s): BNP in the last 8760 hours. Basic Metabolic Panel: Recent Labs  Lab 06/23/20 0532 06/25/20 0629 06/26/20 0445  NA 137 134* 134*  K 4.6 4.4 4.2  CL 105 103 103  CO2 26 25 25   GLUCOSE 100* 103* 97  BUN 36* 34* 35*  CREATININE 0.91 0.80 0.91  CALCIUM 8.4* 8.4* 8.8*  MG  --  2.2 2.2   Liver Function Tests: No results for input(s): AST, ALT, ALKPHOS, BILITOT, PROT, ALBUMIN in the last 168 hours. No  results for input(s): LIPASE, AMYLASE in the last 168 hours. No results for input(s): AMMONIA in the last 168 hours. CBC: Recent Labs  Lab 06/23/20 0532 06/25/20 0629 06/26/20 0445  WBC 10.6* 9.8 8.7  HGB 12.5* 11.4* 11.4*  HCT 36.2* 33.1* 33.3*  MCV 91.4 90.7 90.7  PLT 382 428* 474*   Cardiac Enzymes: No results for input(s): CKTOTAL, CKMB, CKMBINDEX, TROPONINI in the last 168 hours. BNP: Invalid input(s): POCBNP CBG: No results for input(s): GLUCAP in the last 168 hours. D-Dimer No results for input(s): DDIMER in the last 72 hours. Hgb A1c No results for input(s): HGBA1C in the last 72 hours. Lipid Profile No results for input(s): CHOL, HDL, LDLCALC, TRIG, CHOLHDL, LDLDIRECT in the last 72 hours. Thyroid function studies No results for input(s): TSH, T4TOTAL, T3FREE, THYROIDAB  in the last 72 hours.  Invalid input(s): FREET3 Anemia work up No results for input(s): VITAMINB12, FOLATE, FERRITIN, TIBC, IRON, RETICCTPCT in the last 72 hours. Urinalysis    Component Value Date/Time   COLORURINE YELLOW (A) 09/20/2018 1748   APPEARANCEUR CLEAR (A) 09/20/2018 1748   LABSPEC 1.020 09/20/2018 1748   PHURINE 5.0 09/20/2018 1748   GLUCOSEU NEGATIVE 09/20/2018 1748   HGBUR NEGATIVE 09/20/2018 1748   BILIRUBINUR NEGATIVE 09/20/2018 1748   KETONESUR 5 (A) 09/20/2018 1748   PROTEINUR NEGATIVE 09/20/2018 1748   NITRITE NEGATIVE 09/20/2018 1748   LEUKOCYTESUR NEGATIVE 09/20/2018 1748   Sepsis Labs Invalid input(s): PROCALCITONIN,  WBC,  LACTICIDVEN Microbiology Recent Results (from the past 240 hour(s))  SARS CORONAVIRUS 2 (TAT 6-24 HRS) Nasopharyngeal Nasopharyngeal Swab     Status: None   Collection Time: 06/28/20  9:14 AM   Specimen: Nasopharyngeal Swab  Result Value Ref Range Status   SARS Coronavirus 2 NEGATIVE NEGATIVE Final    Comment: (NOTE) SARS-CoV-2 target nucleic acids are NOT DETECTED.  The SARS-CoV-2 RNA is generally detectable in upper and lower respiratory specimens during the acute phase of infection. Negative results do not preclude SARS-CoV-2 infection, do not rule out co-infections with other pathogens, and should not be used as the sole basis for treatment or other patient management decisions. Negative results must be combined with clinical observations, patient history, and epidemiological information. The expected result is Negative.  Fact Sheet for Patients: HairSlick.no  Fact Sheet for Healthcare Providers: quierodirigir.com  This test is not yet approved or cleared by the [place] FDA and  has been authorized for detection and/or diagnosis of SARS-CoV-2 by FDA under an Emergency Use Authorization (EUA). This EUA will remain  in effect (meaning this test can be used) for the  duration of the COVID-19 declaration under Se ction 564(b)(1) of the Act, 21 U.S.C. section 360bbb-3(b)(1), unless the authorization is terminated or revoked sooner.  Performed at [facility], [address] [place], [place] [address]      Total time spend on discharging this patient, including the last patient exam, discussing the hospital stay, instructions for ongoing care as it relates to all pertinent caregivers, as well as preparing the medical discharge records, prescriptions, and/or referrals as applicable, is 30 minutes.    Darlin Priestly, MD  [facility] 06/29/2020, 12:46 PM

## 2020-06-29 NOTE — TOC Progression Note (Addendum)
Transition of Care [facility]) - Progression Note    Patient Details  Name: Charles Ray MRN: [id number] Date of Birth: [birth year]  Transition of Care [facility] CM/SW Contact  Margarito Liner, LCSW Phone Number: 06/29/2020, 9:21 AM  Clinical Narrative:  Left message for [facility] admissions coordinator to check insurance authorization status.   11:55 am: English as a second language teacher approved. MD is aware.  Expected Discharge Plan and Services                                                 Social Determinants of Health (SDOH) Interventions    Readmission Risk Interventions No flowsheet data found.

## 2020-06-29 NOTE — Progress Notes (Signed)
Pt discharged to facility via EMS transport. All belongings sent with pt. Negative pressure changed to honeycomb dressing as instructed by ortho PA. Extra honey comb dressing sent to facility and report called by this RN. All questions answered and VS are below.    06/29/20 1451  Vitals  Temp 97.8 F (36.6 C)  BP 117/70  MAP (mmHg) 84  BP Location Left Arm  BP Method Automatic  Patient Position (if appropriate) Lying  Pulse Rate 80  Pulse Rate Source Monitor  Resp 18  MEWS COLOR  MEWS Score Color Green  Oxygen Therapy  SpO2 96 %  O2 Device Room Air

## 2020-06-30 DIAGNOSIS — F329 Major depressive disorder, single episode, unspecified: Secondary | ICD-10-CM | POA: Diagnosis not present

## 2020-06-30 DIAGNOSIS — Z4889 Encounter for other specified surgical aftercare: Secondary | ICD-10-CM | POA: Diagnosis not present

## 2020-07-08 DIAGNOSIS — Z4802 Encounter for removal of sutures: Secondary | ICD-10-CM | POA: Diagnosis not present

## 2020-07-09 DIAGNOSIS — I10 Essential (primary) hypertension: Secondary | ICD-10-CM | POA: Diagnosis not present

## 2020-07-09 DIAGNOSIS — Z9181 History of falling: Secondary | ICD-10-CM | POA: Diagnosis not present

## 2020-07-09 DIAGNOSIS — D529 Folate deficiency anemia, unspecified: Secondary | ICD-10-CM | POA: Diagnosis not present

## 2020-07-09 DIAGNOSIS — F32A Depression, unspecified: Secondary | ICD-10-CM | POA: Diagnosis not present

## 2020-07-09 DIAGNOSIS — G8929 Other chronic pain: Secondary | ICD-10-CM | POA: Diagnosis not present

## 2020-07-09 DIAGNOSIS — Z96642 Presence of left artificial hip joint: Secondary | ICD-10-CM | POA: Diagnosis not present

## 2020-07-09 DIAGNOSIS — F17210 Nicotine dependence, cigarettes, uncomplicated: Secondary | ICD-10-CM | POA: Diagnosis not present

## 2020-07-09 DIAGNOSIS — S7292XD Unspecified fracture of left femur, subsequent encounter for closed fracture with routine healing: Secondary | ICD-10-CM | POA: Diagnosis not present

## 2020-07-09 DIAGNOSIS — B1920 Unspecified viral hepatitis C without hepatic coma: Secondary | ICD-10-CM | POA: Diagnosis not present

## 2020-07-10 DIAGNOSIS — Z96642 Presence of left artificial hip joint: Secondary | ICD-10-CM | POA: Diagnosis not present

## 2020-07-10 DIAGNOSIS — Z9181 History of falling: Secondary | ICD-10-CM | POA: Diagnosis not present

## 2020-07-10 DIAGNOSIS — S7292XD Unspecified fracture of left femur, subsequent encounter for closed fracture with routine healing: Secondary | ICD-10-CM | POA: Diagnosis not present

## 2020-07-10 DIAGNOSIS — F17210 Nicotine dependence, cigarettes, uncomplicated: Secondary | ICD-10-CM | POA: Diagnosis not present

## 2020-07-10 DIAGNOSIS — F32A Depression, unspecified: Secondary | ICD-10-CM | POA: Diagnosis not present

## 2020-07-10 DIAGNOSIS — I10 Essential (primary) hypertension: Secondary | ICD-10-CM | POA: Diagnosis not present

## 2020-07-10 DIAGNOSIS — B1920 Unspecified viral hepatitis C without hepatic coma: Secondary | ICD-10-CM | POA: Diagnosis not present

## 2020-07-10 DIAGNOSIS — D529 Folate deficiency anemia, unspecified: Secondary | ICD-10-CM | POA: Diagnosis not present

## 2020-07-10 DIAGNOSIS — G8929 Other chronic pain: Secondary | ICD-10-CM | POA: Diagnosis not present

## 2020-07-13 DIAGNOSIS — I10 Essential (primary) hypertension: Secondary | ICD-10-CM | POA: Diagnosis not present

## 2020-07-13 DIAGNOSIS — B1920 Unspecified viral hepatitis C without hepatic coma: Secondary | ICD-10-CM | POA: Diagnosis not present

## 2020-07-13 DIAGNOSIS — G8929 Other chronic pain: Secondary | ICD-10-CM | POA: Diagnosis not present

## 2020-07-13 DIAGNOSIS — F32A Depression, unspecified: Secondary | ICD-10-CM | POA: Diagnosis not present

## 2020-07-13 DIAGNOSIS — F17210 Nicotine dependence, cigarettes, uncomplicated: Secondary | ICD-10-CM | POA: Diagnosis not present

## 2020-07-13 DIAGNOSIS — S7292XD Unspecified fracture of left femur, subsequent encounter for closed fracture with routine healing: Secondary | ICD-10-CM | POA: Diagnosis not present

## 2020-07-13 DIAGNOSIS — D529 Folate deficiency anemia, unspecified: Secondary | ICD-10-CM | POA: Diagnosis not present

## 2020-07-14 DIAGNOSIS — F32A Depression, unspecified: Secondary | ICD-10-CM | POA: Diagnosis not present

## 2020-07-14 DIAGNOSIS — D529 Folate deficiency anemia, unspecified: Secondary | ICD-10-CM | POA: Diagnosis not present

## 2020-07-14 DIAGNOSIS — S7292XD Unspecified fracture of left femur, subsequent encounter for closed fracture with routine healing: Secondary | ICD-10-CM | POA: Diagnosis not present

## 2020-07-14 DIAGNOSIS — F17210 Nicotine dependence, cigarettes, uncomplicated: Secondary | ICD-10-CM | POA: Diagnosis not present

## 2020-07-14 DIAGNOSIS — I10 Essential (primary) hypertension: Secondary | ICD-10-CM | POA: Diagnosis not present

## 2020-07-14 DIAGNOSIS — Z96642 Presence of left artificial hip joint: Secondary | ICD-10-CM | POA: Diagnosis not present

## 2020-07-14 DIAGNOSIS — G8929 Other chronic pain: Secondary | ICD-10-CM | POA: Diagnosis not present

## 2020-07-14 DIAGNOSIS — B1920 Unspecified viral hepatitis C without hepatic coma: Secondary | ICD-10-CM | POA: Diagnosis not present

## 2020-07-14 DIAGNOSIS — Z9181 History of falling: Secondary | ICD-10-CM | POA: Diagnosis not present

## 2020-07-15 DIAGNOSIS — F17210 Nicotine dependence, cigarettes, uncomplicated: Secondary | ICD-10-CM | POA: Diagnosis not present

## 2020-07-15 DIAGNOSIS — G8929 Other chronic pain: Secondary | ICD-10-CM | POA: Diagnosis not present

## 2020-07-15 DIAGNOSIS — F32A Depression, unspecified: Secondary | ICD-10-CM | POA: Diagnosis not present

## 2020-07-15 DIAGNOSIS — Z9181 History of falling: Secondary | ICD-10-CM | POA: Diagnosis not present

## 2020-07-15 DIAGNOSIS — B1920 Unspecified viral hepatitis C without hepatic coma: Secondary | ICD-10-CM | POA: Diagnosis not present

## 2020-07-15 DIAGNOSIS — S7292XD Unspecified fracture of left femur, subsequent encounter for closed fracture with routine healing: Secondary | ICD-10-CM | POA: Diagnosis not present

## 2020-07-15 DIAGNOSIS — D529 Folate deficiency anemia, unspecified: Secondary | ICD-10-CM | POA: Diagnosis not present

## 2020-07-15 DIAGNOSIS — Z96642 Presence of left artificial hip joint: Secondary | ICD-10-CM | POA: Diagnosis not present

## 2020-07-15 DIAGNOSIS — I10 Essential (primary) hypertension: Secondary | ICD-10-CM | POA: Diagnosis not present

## 2020-07-20 DIAGNOSIS — G8929 Other chronic pain: Secondary | ICD-10-CM | POA: Diagnosis not present

## 2020-07-20 DIAGNOSIS — Z96642 Presence of left artificial hip joint: Secondary | ICD-10-CM | POA: Diagnosis not present

## 2020-07-20 DIAGNOSIS — Z9181 History of falling: Secondary | ICD-10-CM | POA: Diagnosis not present

## 2020-07-20 DIAGNOSIS — F32A Depression, unspecified: Secondary | ICD-10-CM | POA: Diagnosis not present

## 2020-07-20 DIAGNOSIS — I10 Essential (primary) hypertension: Secondary | ICD-10-CM | POA: Diagnosis not present

## 2020-07-20 DIAGNOSIS — B1920 Unspecified viral hepatitis C without hepatic coma: Secondary | ICD-10-CM | POA: Diagnosis not present

## 2020-07-20 DIAGNOSIS — F17210 Nicotine dependence, cigarettes, uncomplicated: Secondary | ICD-10-CM | POA: Diagnosis not present

## 2020-07-20 DIAGNOSIS — S7292XD Unspecified fracture of left femur, subsequent encounter for closed fracture with routine healing: Secondary | ICD-10-CM | POA: Diagnosis not present

## 2020-07-20 DIAGNOSIS — D529 Folate deficiency anemia, unspecified: Secondary | ICD-10-CM | POA: Diagnosis not present

## 2020-07-22 DIAGNOSIS — Z96642 Presence of left artificial hip joint: Secondary | ICD-10-CM | POA: Diagnosis not present

## 2020-07-22 DIAGNOSIS — F17210 Nicotine dependence, cigarettes, uncomplicated: Secondary | ICD-10-CM | POA: Diagnosis not present

## 2020-07-22 DIAGNOSIS — I10 Essential (primary) hypertension: Secondary | ICD-10-CM | POA: Diagnosis not present

## 2020-07-22 DIAGNOSIS — G8929 Other chronic pain: Secondary | ICD-10-CM | POA: Diagnosis not present

## 2020-07-22 DIAGNOSIS — Z9181 History of falling: Secondary | ICD-10-CM | POA: Diagnosis not present

## 2020-07-22 DIAGNOSIS — D529 Folate deficiency anemia, unspecified: Secondary | ICD-10-CM | POA: Diagnosis not present

## 2020-07-22 DIAGNOSIS — S7292XD Unspecified fracture of left femur, subsequent encounter for closed fracture with routine healing: Secondary | ICD-10-CM | POA: Diagnosis not present

## 2020-07-22 DIAGNOSIS — F32A Depression, unspecified: Secondary | ICD-10-CM | POA: Diagnosis not present

## 2020-07-22 DIAGNOSIS — B1920 Unspecified viral hepatitis C without hepatic coma: Secondary | ICD-10-CM | POA: Diagnosis not present

## 2020-07-25 DIAGNOSIS — B1920 Unspecified viral hepatitis C without hepatic coma: Secondary | ICD-10-CM | POA: Diagnosis not present

## 2020-07-25 DIAGNOSIS — F17210 Nicotine dependence, cigarettes, uncomplicated: Secondary | ICD-10-CM | POA: Diagnosis not present

## 2020-07-25 DIAGNOSIS — G8929 Other chronic pain: Secondary | ICD-10-CM | POA: Diagnosis not present

## 2020-07-25 DIAGNOSIS — Z9181 History of falling: Secondary | ICD-10-CM | POA: Diagnosis not present

## 2020-07-25 DIAGNOSIS — I10 Essential (primary) hypertension: Secondary | ICD-10-CM | POA: Diagnosis not present

## 2020-07-25 DIAGNOSIS — S7292XD Unspecified fracture of left femur, subsequent encounter for closed fracture with routine healing: Secondary | ICD-10-CM | POA: Diagnosis not present

## 2020-07-25 DIAGNOSIS — Z96642 Presence of left artificial hip joint: Secondary | ICD-10-CM | POA: Diagnosis not present

## 2020-07-25 DIAGNOSIS — F32A Depression, unspecified: Secondary | ICD-10-CM | POA: Diagnosis not present

## 2020-07-25 DIAGNOSIS — D529 Folate deficiency anemia, unspecified: Secondary | ICD-10-CM | POA: Diagnosis not present

## 2020-07-27 DIAGNOSIS — D529 Folate deficiency anemia, unspecified: Secondary | ICD-10-CM | POA: Diagnosis not present

## 2020-07-27 DIAGNOSIS — S7292XD Unspecified fracture of left femur, subsequent encounter for closed fracture with routine healing: Secondary | ICD-10-CM | POA: Diagnosis not present

## 2020-07-27 DIAGNOSIS — I10 Essential (primary) hypertension: Secondary | ICD-10-CM | POA: Diagnosis not present

## 2020-07-27 DIAGNOSIS — Z96642 Presence of left artificial hip joint: Secondary | ICD-10-CM | POA: Diagnosis not present

## 2020-07-27 DIAGNOSIS — F17210 Nicotine dependence, cigarettes, uncomplicated: Secondary | ICD-10-CM | POA: Diagnosis not present

## 2020-07-27 DIAGNOSIS — G8929 Other chronic pain: Secondary | ICD-10-CM | POA: Diagnosis not present

## 2020-07-27 DIAGNOSIS — Z9181 History of falling: Secondary | ICD-10-CM | POA: Diagnosis not present

## 2020-07-27 DIAGNOSIS — B1920 Unspecified viral hepatitis C without hepatic coma: Secondary | ICD-10-CM | POA: Diagnosis not present

## 2020-07-27 DIAGNOSIS — F32A Depression, unspecified: Secondary | ICD-10-CM | POA: Diagnosis not present

## 2020-07-29 DIAGNOSIS — I10 Essential (primary) hypertension: Secondary | ICD-10-CM | POA: Diagnosis not present

## 2020-07-29 DIAGNOSIS — B1920 Unspecified viral hepatitis C without hepatic coma: Secondary | ICD-10-CM | POA: Diagnosis not present

## 2020-07-29 DIAGNOSIS — G8929 Other chronic pain: Secondary | ICD-10-CM | POA: Diagnosis not present

## 2020-07-29 DIAGNOSIS — Z96642 Presence of left artificial hip joint: Secondary | ICD-10-CM | POA: Diagnosis not present

## 2020-07-29 DIAGNOSIS — S7292XD Unspecified fracture of left femur, subsequent encounter for closed fracture with routine healing: Secondary | ICD-10-CM | POA: Diagnosis not present

## 2020-07-29 DIAGNOSIS — F17210 Nicotine dependence, cigarettes, uncomplicated: Secondary | ICD-10-CM | POA: Diagnosis not present

## 2020-07-29 DIAGNOSIS — F32A Depression, unspecified: Secondary | ICD-10-CM | POA: Diagnosis not present

## 2020-07-29 DIAGNOSIS — Z9181 History of falling: Secondary | ICD-10-CM | POA: Diagnosis not present

## 2020-07-29 DIAGNOSIS — D529 Folate deficiency anemia, unspecified: Secondary | ICD-10-CM | POA: Diagnosis not present

## 2020-08-03 DIAGNOSIS — G8929 Other chronic pain: Secondary | ICD-10-CM | POA: Diagnosis not present

## 2020-08-03 DIAGNOSIS — S7292XD Unspecified fracture of left femur, subsequent encounter for closed fracture with routine healing: Secondary | ICD-10-CM | POA: Diagnosis not present

## 2020-08-03 DIAGNOSIS — F17210 Nicotine dependence, cigarettes, uncomplicated: Secondary | ICD-10-CM | POA: Diagnosis not present

## 2020-08-03 DIAGNOSIS — D529 Folate deficiency anemia, unspecified: Secondary | ICD-10-CM | POA: Diagnosis not present

## 2020-08-03 DIAGNOSIS — Z9181 History of falling: Secondary | ICD-10-CM | POA: Diagnosis not present

## 2020-08-03 DIAGNOSIS — F32A Depression, unspecified: Secondary | ICD-10-CM | POA: Diagnosis not present

## 2020-08-03 DIAGNOSIS — I10 Essential (primary) hypertension: Secondary | ICD-10-CM | POA: Diagnosis not present

## 2020-08-03 DIAGNOSIS — Z96642 Presence of left artificial hip joint: Secondary | ICD-10-CM | POA: Diagnosis not present

## 2020-08-03 DIAGNOSIS — B1920 Unspecified viral hepatitis C without hepatic coma: Secondary | ICD-10-CM | POA: Diagnosis not present

## 2020-08-05 DIAGNOSIS — M9702XS Periprosthetic fracture around internal prosthetic left hip joint, sequela: Secondary | ICD-10-CM | POA: Diagnosis not present

## 2020-08-05 DIAGNOSIS — Z96642 Presence of left artificial hip joint: Secondary | ICD-10-CM | POA: Diagnosis not present

## 2020-08-27 ENCOUNTER — Encounter: Payer: Self-pay | Admitting: Orthopedic Surgery

## 2020-12-28 ENCOUNTER — Emergency Department: Payer: Medicare PPO

## 2020-12-28 ENCOUNTER — Inpatient Hospital Stay
Admission: EM | Admit: 2020-12-28 | Discharge: 2021-01-08 | DRG: 177 | Disposition: A | Discharge status: Skilled Nursing Facility | Payer: Medicare PPO | Attending: Internal Medicine | Admitting: Internal Medicine

## 2020-12-28 ENCOUNTER — Other Ambulatory Visit: Payer: Self-pay

## 2020-12-28 DIAGNOSIS — F0390 Unspecified dementia without behavioral disturbance: Secondary | ICD-10-CM | POA: Diagnosis present

## 2020-12-28 DIAGNOSIS — B1920 Unspecified viral hepatitis C without hepatic coma: Secondary | ICD-10-CM | POA: Diagnosis present

## 2020-12-28 DIAGNOSIS — K8689 Other specified diseases of pancreas: Secondary | ICD-10-CM | POA: Diagnosis present

## 2020-12-28 DIAGNOSIS — Z681 Body mass index (BMI) 19 or less, adult: Secondary | ICD-10-CM

## 2020-12-28 DIAGNOSIS — Z720 Tobacco use: Secondary | ICD-10-CM

## 2020-12-28 DIAGNOSIS — Z79899 Other long term (current) drug therapy: Secondary | ICD-10-CM

## 2020-12-28 DIAGNOSIS — I82409 Acute embolism and thrombosis of unspecified deep veins of unspecified lower extremity: Secondary | ICD-10-CM

## 2020-12-28 DIAGNOSIS — E43 Unspecified severe protein-calorie malnutrition: Secondary | ICD-10-CM | POA: Diagnosis present

## 2020-12-28 DIAGNOSIS — Y900 Blood alcohol level of less than 20 mg/100 ml: Secondary | ICD-10-CM | POA: Diagnosis present

## 2020-12-28 DIAGNOSIS — F10188 Alcohol abuse with other alcohol-induced disorder: Secondary | ICD-10-CM | POA: Diagnosis present

## 2020-12-28 DIAGNOSIS — R7989 Other specified abnormal findings of blood chemistry: Secondary | ICD-10-CM

## 2020-12-28 DIAGNOSIS — J189 Pneumonia, unspecified organism: Secondary | ICD-10-CM

## 2020-12-28 DIAGNOSIS — G9341 Metabolic encephalopathy: Secondary | ICD-10-CM | POA: Diagnosis present

## 2020-12-28 DIAGNOSIS — J690 Pneumonitis due to inhalation of food and vomit: Secondary | ICD-10-CM | POA: Diagnosis present

## 2020-12-28 DIAGNOSIS — E871 Hypo-osmolality and hyponatremia: Secondary | ICD-10-CM | POA: Diagnosis present

## 2020-12-28 DIAGNOSIS — Z96642 Presence of left artificial hip joint: Secondary | ICD-10-CM | POA: Diagnosis present

## 2020-12-28 DIAGNOSIS — F17210 Nicotine dependence, cigarettes, uncomplicated: Secondary | ICD-10-CM | POA: Diagnosis present

## 2020-12-28 DIAGNOSIS — U071 COVID-19: Principal | ICD-10-CM | POA: Diagnosis present

## 2020-12-28 DIAGNOSIS — R531 Weakness: Secondary | ICD-10-CM | POA: Diagnosis not present

## 2020-12-28 DIAGNOSIS — A419 Sepsis, unspecified organism: Secondary | ICD-10-CM

## 2020-12-28 DIAGNOSIS — J1282 Pneumonia due to coronavirus disease 2019: Secondary | ICD-10-CM | POA: Diagnosis present

## 2020-12-28 DIAGNOSIS — D75838 Other thrombocytosis: Secondary | ICD-10-CM | POA: Diagnosis present

## 2020-12-28 DIAGNOSIS — R627 Adult failure to thrive: Secondary | ICD-10-CM | POA: Diagnosis present

## 2020-12-28 DIAGNOSIS — F0280 Dementia in other diseases classified elsewhere without behavioral disturbance: Secondary | ICD-10-CM | POA: Diagnosis present

## 2020-12-28 DIAGNOSIS — F1010 Alcohol abuse, uncomplicated: Secondary | ICD-10-CM

## 2020-12-28 LAB — CBC
HCT: 44 % (ref 39.0–52.0)
Hemoglobin: 14.6 g/dL (ref 13.0–17.0)
MCH: 30.7 pg (ref 26.0–34.0)
MCHC: 33.2 g/dL (ref 30.0–36.0)
MCV: 92.6 fL (ref 80.0–100.0)
Platelets: 513 10*3/uL — ABNORMAL HIGH (ref 150–400)
RBC: 4.75 MIL/uL (ref 4.22–5.81)
RDW: 14.7 % (ref 11.5–15.5)
WBC: 19.1 10*3/uL — ABNORMAL HIGH (ref 4.0–10.5)
nRBC: 0 % (ref 0.0–0.2)

## 2020-12-28 LAB — BASIC METABOLIC PANEL
Anion gap: 12 (ref 5–15)
BUN: 26 mg/dL — ABNORMAL HIGH (ref 8–23)
CO2: 22 mmol/L (ref 22–32)
Calcium: 9.1 mg/dL (ref 8.9–10.3)
Chloride: 103 mmol/L (ref 98–111)
Creatinine, Ser: 1.09 mg/dL (ref 0.61–1.24)
GFR, Estimated: >60 mL/min (ref >60)
Glucose, Bld: 105 mg/dL — ABNORMAL HIGH (ref 70–99)
Potassium: 4.2 mmol/L (ref 3.5–5.1)
Sodium: 137 mmol/L (ref 135–145)

## 2020-12-28 LAB — HEPATIC FUNCTION PANEL
ALT: 11 U/L (ref 0–44)
AST: 15 U/L (ref 15–41)
Albumin: 3.3 g/dL — ABNORMAL LOW (ref 3.5–5.0)
Alkaline Phosphatase: 148 U/L — ABNORMAL HIGH (ref 38–126)
Bilirubin, Direct: 0.1 mg/dL (ref 0.0–0.2)
Indirect Bilirubin: 0.6 mg/dL (ref 0.3–0.9)
Total Bilirubin: 0.7 mg/dL (ref 0.3–1.2)
Total Protein: 7.3 g/dL (ref 6.5–8.1)

## 2020-12-28 LAB — LACTIC ACID, PLASMA
Lactic Acid, Venous: 2.9 mmol/L (ref 0.5–1.9)
Lactic Acid, Venous: 1.3 mmol/L (ref 0.5–1.9)

## 2020-12-28 LAB — PROCALCITONIN: Procalcitonin: 0.11 ng/mL

## 2020-12-28 LAB — TROPONIN I (HIGH SENSITIVITY): Troponin I (High Sensitivity): 5 ng/L (ref <18)

## 2020-12-28 LAB — RESP PANEL BY RT-PCR (FLU A&B, COVID) ARPGX2
Influenza A by PCR: NEGATIVE
Influenza B by PCR: NEGATIVE
SARS Coronavirus 2 by RT PCR: POSITIVE — AB

## 2020-12-28 LAB — ETHANOL: Alcohol, Ethyl (B): <10 mg/dL (ref <10)

## 2020-12-28 MED ORDER — IOHEXOL 350 MG/ML SOLN
75.0000 mL | Freq: Once | INTRAVENOUS | Status: AC | PRN
  Administered 2020-12-28: 75 mL via INTRAVENOUS

## 2020-12-28 MED ORDER — ACETAMINOPHEN 325 MG PO TABS
650.0000 mg | ORAL_TABLET | Freq: Four times a day (QID) | ORAL | Status: DC | PRN
  Administered 2020-12-31 – 2021-01-04 (×2): 650 mg via ORAL
  Filled 2020-12-28 (×2): qty 2

## 2020-12-28 MED ORDER — HYDROCOD POLST-CPM POLST ER 10-8 MG/5ML PO SUER
5.0000 mL | Freq: Two times a day (BID) | ORAL | Status: DC | PRN
  Administered 2020-12-31 – 2021-01-07 (×2): 5 mL via ORAL
  Filled 2020-12-28 (×2): qty 5

## 2020-12-28 MED ORDER — ONDANSETRON HCL 4 MG/2ML IJ SOLN: 4.0000 mg | Freq: Four times a day (QID) | INTRAMUSCULAR | Status: DC | PRN

## 2020-12-28 MED ORDER — LORAZEPAM 1 MG PO TABS
1.0000 mg | ORAL_TABLET | ORAL | Status: AC | PRN
  Administered 2020-12-29: 2 mg via ORAL
  Filled 2020-12-28: qty 2

## 2020-12-28 MED ORDER — HYDROCODONE-ACETAMINOPHEN 5-325 MG PO TABS
1.0000 | ORAL_TABLET | Freq: Four times a day (QID) | ORAL | Status: DC | PRN
  Administered 2020-12-28 – 2021-01-06 (×2): 1 via ORAL
  Filled 2020-12-28 (×3): qty 1

## 2020-12-28 MED ORDER — LORAZEPAM 2 MG/ML IJ SOLN
0.0000 mg | Freq: Two times a day (BID) | INTRAMUSCULAR | Status: AC
  Administered 2020-12-30 – 2020-12-31 (×2): 2 mg via INTRAVENOUS
  Filled 2020-12-28 (×2): qty 1

## 2020-12-28 MED ORDER — ONDANSETRON HCL 4 MG PO TABS: 4.0000 mg | ORAL_TABLET | Freq: Four times a day (QID) | ORAL | Status: DC | PRN

## 2020-12-28 MED ORDER — AZITHROMYCIN 500 MG IV SOLR
500.0000 mg | INTRAVENOUS | Status: DC
Start: 2020-12-28 — End: 2020-12-30
  Administered 2020-12-28 – 2020-12-29 (×2): 500 mg via INTRAVENOUS
  Filled 2020-12-28 (×3): qty 500

## 2020-12-28 MED ORDER — THIAMINE HCL 100 MG/ML IJ SOLN
100.0000 mg | Freq: Every day | INTRAMUSCULAR | Status: DC
  Administered 2020-12-28: 100 mg via INTRAVENOUS
  Filled 2020-12-28 (×2): qty 2

## 2020-12-28 MED ORDER — GUAIFENESIN-DM 100-10 MG/5ML PO SYRP
10.0000 mL | ORAL_SOLUTION | ORAL | Status: DC | PRN
  Administered 2020-12-29: 10 mL via ORAL
  Filled 2020-12-28: qty 10

## 2020-12-28 MED ORDER — ENOXAPARIN SODIUM 40 MG/0.4ML IJ SOSY
40.0000 mg | PREFILLED_SYRINGE | INTRAMUSCULAR | Status: DC
  Administered 2020-12-28 – 2021-01-07 (×11): 40 mg via SUBCUTANEOUS
  Filled 2020-12-28 (×11): qty 0.4

## 2020-12-28 MED ORDER — ALBUTEROL SULFATE HFA 108 (90 BASE) MCG/ACT IN AERS
2.0000 | INHALATION_SPRAY | Freq: Four times a day (QID) | RESPIRATORY_TRACT | Status: DC
  Administered 2020-12-29 – 2021-01-08 (×34): 2 via RESPIRATORY_TRACT
  Filled 2020-12-28 (×2): qty 6.7

## 2020-12-28 MED ORDER — SODIUM CHLORIDE 0.9 % IV SOLN
200.0000 mg | Freq: Once | INTRAVENOUS | Status: AC
  Administered 2020-12-28: 200 mg via INTRAVENOUS
  Filled 2020-12-28: qty 200

## 2020-12-28 MED ORDER — FOLIC ACID 1 MG PO TABS
1.0000 mg | ORAL_TABLET | Freq: Every day | ORAL | Status: DC
  Administered 2020-12-28 – 2021-01-08 (×12): 1 mg via ORAL
  Filled 2020-12-28 (×12): qty 1

## 2020-12-28 MED ORDER — LORAZEPAM 2 MG/ML IJ SOLN
1.0000 mg | INTRAMUSCULAR | Status: AC | PRN
  Administered 2020-12-30: 21:00:00 2 mg via INTRAVENOUS
  Filled 2020-12-28: qty 1

## 2020-12-28 MED ORDER — SODIUM CHLORIDE 0.9 % IV SOLN
2.0000 g | INTRAVENOUS | Status: DC
  Administered 2020-12-28 – 2020-12-29 (×2): 2 g via INTRAVENOUS
  Filled 2020-12-28 (×3): qty 20

## 2020-12-28 MED ORDER — LORAZEPAM 2 MG/ML IJ SOLN
0.0000 mg | Freq: Four times a day (QID) | INTRAMUSCULAR | Status: AC
  Administered 2020-12-28 – 2020-12-30 (×2): 1 mg via INTRAVENOUS
  Filled 2020-12-28 (×2): qty 1

## 2020-12-28 MED ORDER — SODIUM CHLORIDE 0.9 % IV BOLUS
1000.0000 mL | Freq: Once | INTRAVENOUS | Status: AC
  Administered 2020-12-28: 1000 mL via INTRAVENOUS

## 2020-12-28 MED ORDER — THIAMINE HCL 100 MG PO TABS
100.0000 mg | ORAL_TABLET | Freq: Every day | ORAL | Status: DC
  Administered 2020-12-29 – 2021-01-08 (×11): 100 mg via ORAL
  Filled 2020-12-28 (×9): qty 1

## 2020-12-28 MED ORDER — AMITRIPTYLINE HCL 100 MG PO TABS
300.0000 mg | ORAL_TABLET | Freq: Every day | ORAL | Status: DC
  Administered 2020-12-28 – 2021-01-07 (×11): 300 mg via ORAL
  Filled 2020-12-28 (×4): qty 3
  Filled 2020-12-28: qty 6
  Filled 2020-12-28 (×7): qty 3

## 2020-12-28 MED ORDER — SODIUM CHLORIDE 0.9 % IV SOLN
100.0000 mg | Freq: Every day | INTRAVENOUS | Status: AC
  Administered 2020-12-29 – 2021-01-01 (×4): 100 mg via INTRAVENOUS
  Filled 2020-12-28 (×2): qty 20
  Filled 2020-12-28 (×2): qty 100
  Filled 2020-12-28: qty 20

## 2020-12-28 MED ORDER — ADULT MULTIVITAMIN W/MINERALS CH
1.0000 | ORAL_TABLET | Freq: Every day | ORAL | Status: DC
  Administered 2020-12-28 – 2021-01-08 (×12): 1 via ORAL
  Filled 2020-12-28 (×11): qty 1

## 2020-12-28 NOTE — ED Notes (Signed)
Called lab to add on COVID swab to pt's previous collection.

## 2020-12-28 NOTE — ED Notes (Signed)
Went in to find pt had pulled out IV, blood on pt and equipment; pt cleaned and new gown placed on pt; pt encouraged to stay in bed and use call bell if needed

## 2020-12-28 NOTE — ED Notes (Signed)
Bilateral pulses present in pt's feet. Marked with expo marker.

## 2020-12-28 NOTE — Consult Note (Signed)
Remdesivir - Pharmacy Brief Note   O:  ALT: 148 CXR: No acute cardiopulmonary radiographic abnormality  SpO2: 95% on Room Air   A/P:  Remdesivir 200 mg IVPB once followed by 100 mg IVPB daily x 4 days.   Algie Coffer, RPh 12/28/2020 7:53 PM

## 2020-12-28 NOTE — Consult Note (Signed)
CODE SEPSIS - PHARMACY COMMUNICATION  **Broad Spectrum Antibiotics should be administered within 1 hour of Sepsis diagnosis**  Time Code Sepsis Called/Page Received: 1711  Antibiotics Ordered:  Azithromycin Ceftraixone  Time of 1st antibiotic administration: 1744   Tramon Crescenzo Rodriguez-Guzman PharmD, BCPS 12/28/2020 5:14 PM

## 2020-12-28 NOTE — ED Notes (Signed)
Pt states he needs urinal and dentures; pt came from rehab

## 2020-12-28 NOTE — ED Notes (Signed)
Dinner meal tray given.  

## 2020-12-28 NOTE — ED Provider Notes (Signed)
[facility] Emergency Department Provider Note  ____________________________________________   Event Date/Time   First MD Initiated Contact with Patient 12/28/20 1507     (approximate)  I have reviewed the triage vital signs and the nursing notes.   HISTORY  Chief Complaint Weakness    HPI Charles Ray is a 70 y.o. male with a history of left hip replacement about 2 weeks ago who currently resides at [facility] for the past week who comes in with concern for generalized weakness.  Sister is in the room who states 3 days ago he had a cough and had bright sputum and getting weaker in nature. Lives in [place] with another son.  More weak today then normal so came in. Normally ambulates with cane or walker. Son wondered if he had taken too much amitriptyline but then reports he only took 2 of them.  This was on Saturday.  Does have h/o etoh use. Son was sick recently with cough. No Drug use.  He did fall a couple days ago with pain in the left hip where he had the hip surgery.  Patient does report feeling more short of breath, constant, nothing makes it better, worse with exertion.  No leg swelling.  He does report a fall a few days ago where his left knee gave out.  He reports little bit of pain when he bears weight but nothing at this time.  Still able to bear weight on his legs.  Denies any abdominal pain or any back pain.   History reviewed. No pertinent past medical history.  Patient Active Problem List   Diagnosis Date Noted   Closed fracture of left hip [facility])    Fall    Femur fracture, left (HCC) 06/18/2020   Alcohol abuse 06/18/2020   Family history of colon cancer 07/05/2013   Tests ordered 07/04/2012   Hepatitis C 12/27/2011   History of alcohol abuse 12/27/2011   Tobacco use 12/27/2011   Chronic pain 12/27/2011    Past Surgical History:  Procedure Laterality Date   TOTAL HIP ARTHROPLASTY Left 06/19/2020   Procedure: TOTAL HIP  ARTHROPLASTY;  Surgeon: Kennedy Bucker, MD;  Location: [facility] ORS;  Service: Orthopedics;  Laterality: Left;    Prior to Admission medications   Medication Sig Start Date End Date Taking? Authorizing Provider  albuterol (VENTOLIN HFA) 108 (90 Base) MCG/ACT inhaler TWO PUFFS EVERY 4 HOURS AS NEEDED FOR WHEEZE AND COUGH. 10/31/17   [provider]  amitriptyline (ELAVIL) 150 MG tablet Take 2 tablets by mouth at bedtime. 09/08/17   [provider]  enoxaparin (LOVENOX) 40 MG/0.4ML injection Inject 0.4 mLs (40 mg total) into the skin daily. 06/22/20   Evon Slack, PA-C  folic acid (FOLVITE) 1 MG tablet Take 1 tablet (1 mg total) by mouth daily. 06/30/20   Darlin Priestly, MD  HYDROcodone-acetaminophen (NORCO/VICODIN) 5-325 MG tablet Take 1-2 tablets by mouth every 6 (six) hours as needed for moderate pain. 06/22/20   Evon Slack, PA-C  Multiple Vitamin (MULTIVITAMIN) capsule Take 1 capsule by mouth daily.    [provider]  thiamine 100 MG tablet Take 1 tablet (100 mg total) by mouth daily. 06/30/20   Darlin Priestly, MD    Allergies Patient has no known allergies.  No family history on file.  Social History Social History   Tobacco Use   Smoking status: Every Day    Packs/day: 1.00    Years: 50.00    Pack years: 50.00  Types: Cigarettes   Smokeless tobacco: Never  Substance Use Topics   Alcohol use: Yes    Alcohol/week: 3.0 standard drinks    Types: 3 Shots of liquor per week    Comment: 3 shots every evening   Drug use: Never      Review of Systems Constitutional: No fever/chills, weakness  Eyes: No visual changes. ENT: No sore throat. Cardiovascular: Denies chest pain. Respiratory: Shortness of breath cough  Gastrointestinal: No abdominal pain.  No nausea, no vomiting.  No diarrhea.  No constipation. Genitourinary: Negative for dysuria. Musculoskeletal: Negative for back pain. Hip pain  Skin: Negative for rash. Neurological: Negative for headaches,  focal weakness or numbness. All other ROS negative ____________________________________________   PHYSICAL EXAM:  VITAL SIGNS: ED Triage Vitals  Enc Vitals Group     BP 12/28/20 1043 93/74     Pulse Rate 12/28/20 1043 100     Resp 12/28/20 1043 16     Temp 12/28/20 1043 98.3 F (36.8 C)     Temp Source 12/28/20 1043 Oral     SpO2 12/28/20 1043 95 %     Weight 12/28/20 1042 155 lb (70.3 kg)     Height 12/28/20 1042 6\' 5"  (1.956 m)     Head Circumference --      [facility] --      Pain Score 12/28/20 1041 7     Pain Loc --      Pain Edu? --      Excl. in GC? --     Constitutional: Alert and oriented. Well appearing and in no acute distress.elderly appearing  Eyes: Conjunctivae are normal. EOMI. Head: Atraumatic. Nose: No congestion/rhinnorhea. Mouth/Throat: Mucous membranes are moist.   Neck: No stridor. Trachea Midline. FROM Cardiovascular: Normal rate, regular rhythm. Grossly normal heart sounds.  Good peripheral circulation. Respiratory: Normal respiratory effort.  No retractions. Lungs CTAB. Gastrointestinal: Soft and nontender. No distention. No abdominal bruits.  Musculoskeletal: No lower extremity tenderness nor edema.  No joint effusions. Well healing scar on left hip. Both legs feel cold but doppler pulses bilaterally.  Neurologic:  Normal speech and language. No gross focal neurologic deficits are appreciated.  Skin:  Skin is warm, dry and intact. No rash noted. Psychiatric: Mood and affect are normal. Speech and behavior are normal. GU: Deferred   ____________________________________________   LABS (all labs ordered are listed, but only abnormal results are displayed)  Labs Reviewed  BASIC METABOLIC PANEL - Abnormal; Notable for the following components:      Result Value   Glucose, Bld 105 (*)    BUN 26 (*)    All other components within normal limits  CBC - Abnormal; Notable for the following components:   WBC 19.1 (*)    Platelets 513 (*)    All  other components within normal limits  HEPATIC FUNCTION PANEL - Abnormal; Notable for the following components:   Albumin 3.3 (*)    Alkaline Phosphatase 148 (*)    All other components within normal limits  LACTIC ACID, PLASMA - Abnormal; Notable for the following components:   Lactic Acid, Venous 2.9 (*)    All other components within normal limits  CULTURE, BLOOD (SINGLE)  URINALYSIS, COMPLETE (UACMP) WITH MICROSCOPIC  LACTIC ACID, PLASMA   ____________________________________________   ED ECG REPORT I, 12/30/20, the attending physician, personally viewed and interpreted this ECG.  Sinus tachy at 105, no st elevation, no twi, normal intervals  ____________________________________________  RADIOLOGY Charles Ray,  personally viewed and evaluated these images (plain radiographs) as part of my medical decision making, as well as reviewing the written report by the radiologist.  ED MD interpretation:  no pna   Official radiology report(s): DG Chest 2 View  Result Date: 12/28/2020 CLINICAL DATA:  cough EXAM: CHEST - 2 VIEW COMPARISON:  None. FINDINGS: The cardiomediastinal silhouette is within normal limits. No pleural effusion. No pneumothorax. No mass or consolidation. Healing right tenth rib fracture. IMPRESSION: 1. No acute cardiopulmonary radiographic abnormality. 2. Healing right tenth rib fracture. Electronically Signed   By: Olive Bass M.D.   On: 12/28/2020 11:49    ____________________________________________   PROCEDURES  Procedure(s) performed (including Critical Care):  .1-3 Lead EKG Interpretation Performed by: Charles Se, MD Authorized by: Charles Se, MD     Interpretation: normal     ECG rate:  70s   ECG rate assessment: normal     Rhythm: sinus rhythm     Ectopy: none     Conduction: normal   .Critical Care Performed by: Charles Se, MD Authorized by: Charles Se, MD   Critical care provider statement:    Critical care time  (minutes):  35   Critical care was necessary to treat or prevent imminent or life-threatening deterioration of the following conditions:  Sepsis   Critical care was time spent personally by me on the following activities:  Discussions with consultants, evaluation of patient's response to treatment, examination of patient, ordering and performing treatments and interventions, ordering and review of laboratory studies, ordering and review of radiographic studies, pulse oximetry, re-evaluation of patient's condition, obtaining history from patient or surrogate and review of old charts   ____________________________________________   INITIAL IMPRESSION / ASSESSMENT AND PLAN / ED COURSE  NAJEEB UPTAIN was evaluated in Emergency Department on 12/28/2020 for the symptoms described in the history of present illness. He was evaluated in the context of the global COVID-19 pandemic, which necessitated consideration that the patient might be at risk for infection with the SARS-CoV-2 virus that causes COVID-19. Institutional protocols and algorithms that pertain to the evaluation of patients at risk for COVID-19 are in a state of rapid change based on information released by regulatory bodies including the CDC and federal and state organizations. These policies and algorithms were followed during the patient's care in the ED.    Patient comes in with weakness, shortness of breath, cough.  We will get COVID test.  Given recent hip surgery with shortness of breath will get CT PE to make sure no evidence of pulmonary embolism versus a pneumonia that is hiding versus COVID.  We will also get CT head given the fall to extremes of intercranial hemorrhage.  No cervical spine tenderness to suggest cervical fracture.  No abdominal pain to suggest abdominal injury or infection.  Will get urine to evaluate for UTI.  We will keep patient the cardiac monitor to evaluate for any cardiorespiratory decompensation  Labs are  reassuring but white count is elevated patient SIRS criteria with his elevated white count elevated heart rate when he came in.  His CT scan is concerning for pneumonia.  Patient steroids and ceftriaxone azithromycin.  Given the worsening weakness we will discussed the hospital team for admission     ____________________________________________   FINAL CLINICAL IMPRESSION(S) / ED DIAGNOSES   Final diagnoses:  Weakness  Community acquired pneumonia, unspecified laterality  Sepsis, due to unspecified organism, unspecified whether acute organ dysfunction present (HCC)  MEDICATIONS GIVEN DURING THIS VISIT:  Medications  cefTRIAXone (ROCEPHIN) 2 g in sodium chloride 0.9 % 100 mL IVPB (has no administration in time range)  azithromycin (ZITHROMAX) 500 mg in sodium chloride 0.9 % 250 mL IVPB (has no administration in time range)  sodium chloride 0.9 % bolus 1,000 mL (1,000 mLs Intravenous New Bag/Given 12/28/20 1638)  iohexol (OMNIPAQUE) 350 MG/ML injection 75 mL (75 mLs Intravenous Contrast Given 12/28/20 1612)     ED Discharge Orders     None        Note:  This document was prepared using Dragon voice recognition software and may include unintentional dictation errors.    Charles Se, MD 12/28/20 1705

## 2020-12-28 NOTE — ED Triage Notes (Signed)
Patient to ER via [facility] for c/o generalized weakness. Patient had L hip replacement about 2 weeks ago with rehab at [facility] x1 week. Per EMS, patient not eating x4 days, +weakness. Vitals per EMS: 140/68, 95HR, 96% O2 on RA, 117 CBG, 26 CO2. A&Ox4.

## 2020-12-28 NOTE — H&P (Signed)
History and Physical    Charles Ray UYQ:[id number] DOB: 1951-02-21 DOA: 12/28/2020  PCP: Jerl Mina, MD  Patient coming from: Home, patient currently staying in a motel room with his son  I have personally briefly reviewed patient's old medical records in [facility]  Chief Complaint: Weakness, cough  HPI: Charles Ray is a 70 y.o. male with medical history significant of previous hip fracture in 06/2020, alcohol use, tobacco use, patient is currently living with his son in a motel.  He was previously living with his wife until a week ago.  Review of records indicate that patient has been having some issues with his memory as well as having significant weight loss.  He reports that for the past week he has had decreased p.o. intake and lack of appetite.  He has had a cough which has been nonproductive.  Denies any fever or shortness of breath.  Denies any dysuria.  No nausea or vomiting.  He has been increasingly weak.  Records indicate that family has had difficulty getting him up.  He does have some chronic issues with hip pain from his surgery earlier this year.  It has been even harder to get him up and moving now.  ED Course: On arrival to the emergency room, he is noted to be mildly febrile.  He is not hypoxic.  Blood pressure stable.  CT chest performed was negative for pulmonary embolism.  It did indicate developing left lower lobe pneumonia.  He was noted to have elevated WBC count.  Lactic acid mildly elevated, improved with 1 L fluid.  COVID test was positive.  Review of Systems:  Review of Systems  Constitutional:  Positive for malaise/fatigue and weight loss. Negative for chills and fever.  HENT:  Negative for congestion and sore throat.   Eyes:  Negative for blurred vision and double vision.  Respiratory:  Positive for cough. Negative for shortness of breath.   Cardiovascular:  Negative for chest pain and leg swelling.  Gastrointestinal:  Negative for abdominal  pain, diarrhea, nausea and vomiting.  Genitourinary:  Negative for dysuria.  Neurological:  Positive for weakness. Negative for headaches.     History reviewed. No pertinent past medical history.  Past Surgical History:  Procedure Laterality Date   TOTAL HIP ARTHROPLASTY Left 06/19/2020   Procedure: TOTAL HIP ARTHROPLASTY;  Surgeon: Kennedy Bucker, MD;  Location: [facility] ORS;  Service: Orthopedics;  Laterality: Left;    Social History:  reports that he has been smoking cigarettes. He has a 50.00 pack-year smoking history. He has never used smokeless tobacco. He reports current alcohol use of about 3.0 standard drinks per week. He reports that he does not use drugs.  No Known Allergies   Family history: Family history reviewed and not pertinent  Prior to Admission medications   Medication Sig Start Date End Date Taking? Authorizing Provider  albuterol (VENTOLIN HFA) 108 (90 Base) MCG/ACT inhaler Inhale 2 puffs into the lungs every 4 (four) hours as needed for wheezing or shortness of breath.   Yes [provider]  amitriptyline (ELAVIL) 150 MG tablet Take 300 mg by mouth at bedtime.   Yes [provider]  enoxaparin (LOVENOX) 40 MG/0.4ML injection Inject 0.4 mLs (40 mg total) into the skin daily. Patient not taking: No sig reported 06/22/20   Evon Slack, PA-C  folic acid (FOLVITE) 1 MG tablet Take 1 tablet (1 mg total) by mouth daily. Patient not taking: No sig reported 06/30/20   Fran Lowes,  Inetta Fermo, MD  HYDROcodone-acetaminophen (NORCO/VICODIN) 5-325 MG tablet Take 1-2 tablets by mouth every 6 (six) hours as needed for moderate pain. Patient not taking: No sig reported 06/22/20   Evon Slack, PA-C  thiamine 100 MG tablet Take 1 tablet (100 mg total) by mouth daily. Patient not taking: No sig reported 06/30/20   Darlin Priestly, MD    Physical Exam: Vitals:   12/28/20 1347 12/28/20 1530 12/28/20 1600 12/28/20 1700  BP: 110/76 (!) 149/86 (!) 149/84 137/74  Pulse: 86 75 73  73  Resp: 20 19 20 18   Temp:      TempSrc:      SpO2: 95% 98% 97% 94%  Weight:      Height:        Constitutional: NAD, calm, comfortable, cachectic Eyes: PERRL, lids and conjunctivae normal ENMT: Mucous membranes are moist. Posterior pharynx clear of any exudate or lesions.Normal dentition.  Neck: normal, supple, no masses, no thyromegaly Respiratory: clear to auscultation bilaterally, no wheezing, no crackles. Normal respiratory effort. No accessory muscle use.  Cardiovascular: Regular rate and rhythm, no murmurs / rubs / gallops. No extremity edema. 2+ pedal pulses. No carotid bruits.  Abdomen: no tenderness, no masses palpated. No hepatosplenomegaly. Bowel sounds positive.  Musculoskeletal: no clubbing / cyanosis. No joint deformity upper and lower extremities. Good ROM, no contractures. Normal muscle tone.  Skin: no rashes, lesions, ulcers. No induration Neurologic: CN 2-12 grossly intact. Sensation intact, DTR normal. Strength 5/5 in all 4.  Psychiatric: Normal judgment and insight. Alert and oriented x 3. Normal mood.    Labs on Admission: I have personally reviewed following labs and imaging studies  CBC: Recent Labs  Lab 12/28/20 1052  WBC 19.1*  HGB 14.6  HCT 44.0  MCV 92.6  PLT 513*   Basic Metabolic Panel: Recent Labs  Lab 12/28/20 1052  NA 137  K 4.2  CL 103  CO2 22  GLUCOSE 105*  BUN 26*  CREATININE 1.09  CALCIUM 9.1   GFR: Estimated Creatinine Clearance: 60.9 mL/min (by C-G formula based on SCr of 1.09 mg/dL). Liver Function Tests: Recent Labs  Lab 12/28/20 1052  AST 15  ALT 11  ALKPHOS 148*  BILITOT 0.7  PROT 7.3  ALBUMIN 3.3*   No results for input(s): LIPASE, AMYLASE in the last 168 hours. No results for input(s): AMMONIA in the last 168 hours. Coagulation Profile: No results for input(s): INR, PROTIME in the last 168 hours. Cardiac Enzymes: No results for input(s): CKTOTAL, CKMB, CKMBINDEX, TROPONINI in the last 168 hours. BNP (last  3 results) No results for input(s): PROBNP in the last 8760 hours. HbA1C: No results for input(s): HGBA1C in the last 72 hours. CBG: No results for input(s): GLUCAP in the last 168 hours. Lipid Profile: No results for input(s): CHOL, HDL, LDLCALC, TRIG, CHOLHDL, LDLDIRECT in the last 72 hours. Thyroid Function Tests: No results for input(s): TSH, T4TOTAL, FREET4, T3FREE, THYROIDAB in the last 72 hours. Anemia Panel: No results for input(s): VITAMINB12, FOLATE, FERRITIN, TIBC, IRON, RETICCTPCT in the last 72 hours. Urine analysis:    Component Value Date/Time   COLORURINE YELLOW (A) 09/20/2018 1748   APPEARANCEUR CLEAR (A) 09/20/2018 1748   LABSPEC 1.020 09/20/2018 1748   PHURINE 5.0 09/20/2018 1748   GLUCOSEU NEGATIVE 09/20/2018 1748   HGBUR NEGATIVE 09/20/2018 1748   BILIRUBINUR NEGATIVE 09/20/2018 1748   KETONESUR 5 (A) 09/20/2018 1748   PROTEINUR NEGATIVE 09/20/2018 1748   NITRITE NEGATIVE 09/20/2018 1748   LEUKOCYTESUR NEGATIVE 09/20/2018  1748    Radiological Exams on Admission: DG Chest 2 View  Result Date: 12/28/2020 CLINICAL DATA:  cough EXAM: CHEST - 2 VIEW COMPARISON:  None. FINDINGS: The cardiomediastinal silhouette is within normal limits. No pleural effusion. No pneumothorax. No mass or consolidation. Healing right tenth rib fracture. IMPRESSION: 1. No acute cardiopulmonary radiographic abnormality. 2. Healing right tenth rib fracture. Electronically Signed   By: Olive Bass M.D.   On: 12/28/2020 11:49   CT HEAD WO CONTRAST ( )  Result Date: 12/28/2020 CLINICAL DATA:  Generalized weakness, status post left hip replacement 2 weeks ago. EXAM: CT HEAD WITHOUT CONTRAST TECHNIQUE: Contiguous axial images were obtained from the base of the skull through the vertex without intravenous contrast. COMPARISON:  September 20, 2018 FINDINGS: Brain: There is mild cerebral atrophy with widening of the extra-axial spaces and ventricular dilatation. There are areas of decreased  attenuation within the white matter tracts of the supratentorial brain, consistent with microvascular disease changes. A chronic right basal ganglia lacunar infarct is seen. Vascular: No hyperdense vessel or unexpected calcification. Skull: Normal. Negative for fracture or focal lesion. Sinuses/Orbits: No acute finding. Other: None. IMPRESSION: 1. Generalized cerebral atrophy. 2. Chronic right basal ganglia lacunar infarct. 3. No acute intracranial abnormality. Electronically Signed   By: Aram Candela M.D.   On: 12/28/2020 16:50   CT Angio Chest PE W and/or Wo Contrast  Result Date: 12/28/2020 CLINICAL DATA:  PE suspected, high probability. Two weeks status post left hip replacement. EXAM: CT ANGIOGRAPHY CHEST WITH CONTRAST TECHNIQUE: Multidetector CT imaging of the chest was performed using the standard protocol during bolus administration of intravenous contrast. Multiplanar CT image reconstructions and MIPs were obtained to evaluate the vascular anatomy. CONTRAST:  108mL OMNIPAQUE IOHEXOL 350 MG/ML SOLN COMPARISON:  None. FINDINGS: Cardiovascular: Satisfactory opacification of the pulmonary arteries to the segmental level. No evidence of pulmonary embolism. Normal heart size. No pericardial effusion. Ectatic but nonaneurysmal ascending thoracic aorta. Atherosclerotic calcifications present along the thoracic aorta and coronary arteries. Mediastinum/Nodes: No enlarged mediastinal, hilar, or axillary lymph nodes. Thyroid gland, trachea, and esophagus demonstrate no significant findings. Lungs/Pleura: Advanced combined centrilobular and paraseptal pulmonary emphysema. Patchy airspace opacity present along the dependent portion of the left lower lobe. Diffuse mild bronchial wall thickening. No suspicious pulmonary mass or nodule. Biapical pleuroparenchymal scarring. Upper Abdomen: No acute abnormality. Multiple small radiopaque stones layer within the gallbladder lumen. No findings to suggest acute  cholecystitis. Peripherally calcified low-attenuation lesion within the interpolar left kidney measures up to 2.6 cm. This lesion is incompletely characterized on the present examination. Musculoskeletal: No chest wall abnormality. No acute or significant osseous findings. Probable remote L2 compression fracture. Review of the MIP images confirms the above findings. IMPRESSION: 1. Negative for acute pulmonary embolus. 2. Patchy airspace opacity in the dependent aspect of the left lower lobe may reflect early bronchopneumonia or small volume aspiration. 3. Aortic and coronary artery atherosclerotic vascular calcifications. 4. Advanced combined paraseptal and centrilobular pulmonary emphysema. 5. Cholelithiasis. 6. Likely remote L2 compression fracture. 7. Incompletely evaluated but likely benign peripherally calcified cystic lesion in the left kidney. Aortic Atherosclerosis (ICD10-I70.0) and Emphysema (ICD10-J43.9). Electronically Signed   By: Malachy Moan M.D.   On: 12/28/2020 16:31    EKG: Independently reviewed.  Sinus tachycardia  Assessment/Plan Active Problems:   Hepatitis C   Tobacco use   Alcohol abuse   Pneumonia due to COVID-19 virus   Generalized weakness     Pneumonia, likely related to COVID-19 -Started on remdesivir -He  is currently not hypoxic or short of breath, will monitor -If he does become hypoxic, can consider starting on steroids -Check inflammatory markers -Starting on IV antibiotics for any bacterial superinfection -Checking urine Legionella and strep pneumo antigens -Sepsis ruled out  Alcohol abuse -Reports that his last drink was approximately 2 to 3 days ago -Start on CIWA protocol  Hepatitis C -Previously treated  Generalized weakness -Related to decreased p.o. intake and underlying infectious process -PT eval  DVT prophylaxis: Lovenox Code Status: Full code Family Communication: No family present Disposition Plan: May need placement Consults  called:   Admission status: Observation, MedSurg  Erick Blinks MD [facility]   If 7PM-7AM, please contact night-coverage [email or web address]   12/28/2020, 7:39 PM

## 2020-12-29 ENCOUNTER — Observation Stay: Payer: Medicare PPO

## 2020-12-29 DIAGNOSIS — J690 Pneumonitis due to inhalation of food and vomit: Secondary | ICD-10-CM | POA: Diagnosis present

## 2020-12-29 DIAGNOSIS — E871 Hypo-osmolality and hyponatremia: Secondary | ICD-10-CM | POA: Diagnosis present

## 2020-12-29 DIAGNOSIS — F17210 Nicotine dependence, cigarettes, uncomplicated: Secondary | ICD-10-CM | POA: Diagnosis present

## 2020-12-29 DIAGNOSIS — Z720 Tobacco use: Secondary | ICD-10-CM

## 2020-12-29 DIAGNOSIS — D75838 Other thrombocytosis: Secondary | ICD-10-CM | POA: Diagnosis present

## 2020-12-29 DIAGNOSIS — R7989 Other specified abnormal findings of blood chemistry: Secondary | ICD-10-CM | POA: Diagnosis not present

## 2020-12-29 DIAGNOSIS — Z681 Body mass index (BMI) 19 or less, adult: Secondary | ICD-10-CM | POA: Diagnosis not present

## 2020-12-29 DIAGNOSIS — R5383 Other fatigue: Secondary | ICD-10-CM | POA: Diagnosis not present

## 2020-12-29 DIAGNOSIS — R531 Weakness: Secondary | ICD-10-CM | POA: Diagnosis present

## 2020-12-29 DIAGNOSIS — E43 Unspecified severe protein-calorie malnutrition: Secondary | ICD-10-CM | POA: Diagnosis present

## 2020-12-29 DIAGNOSIS — K8689 Other specified diseases of pancreas: Secondary | ICD-10-CM | POA: Diagnosis present

## 2020-12-29 DIAGNOSIS — Z79899 Other long term (current) drug therapy: Secondary | ICD-10-CM | POA: Diagnosis not present

## 2020-12-29 DIAGNOSIS — R413 Other amnesia: Secondary | ICD-10-CM | POA: Insufficient documentation

## 2020-12-29 DIAGNOSIS — F10188 Alcohol abuse with other alcohol-induced disorder: Secondary | ICD-10-CM | POA: Diagnosis present

## 2020-12-29 DIAGNOSIS — F0280 Dementia in other diseases classified elsewhere without behavioral disturbance: Secondary | ICD-10-CM | POA: Diagnosis present

## 2020-12-29 DIAGNOSIS — Z96642 Presence of left artificial hip joint: Secondary | ICD-10-CM | POA: Diagnosis present

## 2020-12-29 DIAGNOSIS — J1282 Pneumonia due to coronavirus disease 2019: Secondary | ICD-10-CM | POA: Diagnosis present

## 2020-12-29 DIAGNOSIS — G9341 Metabolic encephalopathy: Secondary | ICD-10-CM | POA: Diagnosis present

## 2020-12-29 DIAGNOSIS — F1010 Alcohol abuse, uncomplicated: Secondary | ICD-10-CM | POA: Diagnosis not present

## 2020-12-29 DIAGNOSIS — Y900 Blood alcohol level of less than 20 mg/100 ml: Secondary | ICD-10-CM | POA: Diagnosis present

## 2020-12-29 DIAGNOSIS — U071 COVID-19: Secondary | ICD-10-CM | POA: Diagnosis present

## 2020-12-29 DIAGNOSIS — R627 Adult failure to thrive: Secondary | ICD-10-CM | POA: Diagnosis present

## 2020-12-29 DIAGNOSIS — F0390 Unspecified dementia without behavioral disturbance: Secondary | ICD-10-CM | POA: Diagnosis present

## 2020-12-29 DIAGNOSIS — B1920 Unspecified viral hepatitis C without hepatic coma: Secondary | ICD-10-CM | POA: Diagnosis present

## 2020-12-29 LAB — URINALYSIS, COMPLETE (UACMP) WITH MICROSCOPIC
Bacteria, UA: NONE SEEN
Bilirubin Urine: NEGATIVE
Glucose, UA: NEGATIVE mg/dL
Hgb urine dipstick: NEGATIVE
Ketones, ur: NEGATIVE mg/dL
Leukocytes,Ua: NEGATIVE
Nitrite: NEGATIVE
Protein, ur: NEGATIVE mg/dL
Specific Gravity, Urine: 1.025 (ref 1.005–1.030)
pH: 5.5 (ref 5.0–8.0)

## 2020-12-29 LAB — COMPREHENSIVE METABOLIC PANEL
ALT: 11 U/L (ref 0–44)
AST: 18 U/L (ref 15–41)
Albumin: 3 g/dL — ABNORMAL LOW (ref 3.5–5.0)
Alkaline Phosphatase: 128 U/L — ABNORMAL HIGH (ref 38–126)
Anion gap: 9 (ref 5–15)
BUN: 28 mg/dL — ABNORMAL HIGH (ref 8–23)
CO2: 25 mmol/L (ref 22–32)
Calcium: 8.5 mg/dL — ABNORMAL LOW (ref 8.9–10.3)
Chloride: 105 mmol/L (ref 98–111)
Creatinine, Ser: 0.93 mg/dL (ref 0.61–1.24)
GFR, Estimated: >60 mL/min (ref >60)
Glucose, Bld: 95 mg/dL (ref 70–99)
Potassium: 3.7 mmol/L (ref 3.5–5.1)
Sodium: 139 mmol/L (ref 135–145)
Total Bilirubin: 0.4 mg/dL (ref 0.3–1.2)
Total Protein: 6.8 g/dL (ref 6.5–8.1)

## 2020-12-29 LAB — CBC WITH DIFFERENTIAL/PLATELET
Abs Immature Granulocytes: 0.1 10*3/uL — ABNORMAL HIGH (ref 0.00–0.07)
Basophils Absolute: 0.1 10*3/uL (ref 0.0–0.1)
Basophils Relative: 1 %
Eosinophils Absolute: 0.2 10*3/uL (ref 0.0–0.5)
Eosinophils Relative: 2 %
HCT: 40.4 % (ref 39.0–52.0)
Hemoglobin: 13.5 g/dL (ref 13.0–17.0)
Immature Granulocytes: 1 %
Lymphocytes Relative: 16 %
Lymphs Abs: 1.8 10*3/uL (ref 0.7–4.0)
MCH: 30.4 pg (ref 26.0–34.0)
MCHC: 33.4 g/dL (ref 30.0–36.0)
MCV: 91 fL (ref 80.0–100.0)
Monocytes Absolute: 1.1 10*3/uL — ABNORMAL HIGH (ref 0.1–1.0)
Monocytes Relative: 10 %
Neutro Abs: 7.8 10*3/uL — ABNORMAL HIGH (ref 1.7–7.7)
Neutrophils Relative %: 70 %
Platelets: 413 10*3/uL — ABNORMAL HIGH (ref 150–400)
RBC: 4.44 MIL/uL (ref 4.22–5.81)
RDW: 14.8 % (ref 11.5–15.5)
WBC: 11.1 10*3/uL — ABNORMAL HIGH (ref 4.0–10.5)
nRBC: 0 % (ref 0.0–0.2)

## 2020-12-29 LAB — FERRITIN: Ferritin: 540 ng/mL — ABNORMAL HIGH (ref 24–336)

## 2020-12-29 LAB — C-REACTIVE PROTEIN: CRP: 7.1 mg/dL — ABNORMAL HIGH (ref <1.0)

## 2020-12-29 LAB — PHOSPHORUS: Phosphorus: 3.5 mg/dL (ref 2.5–4.6)

## 2020-12-29 LAB — PROCALCITONIN: Procalcitonin: <0.1 ng/mL

## 2020-12-29 LAB — MAGNESIUM: Magnesium: 2 mg/dL (ref 1.7–2.4)

## 2020-12-29 LAB — STREP PNEUMONIAE URINARY ANTIGEN: Strep Pneumo Urinary Antigen: NEGATIVE

## 2020-12-29 LAB — D-DIMER, QUANTITATIVE: D-Dimer, Quant: 2.4 ug/mL-FEU — ABNORMAL HIGH (ref 0.00–0.50)

## 2020-12-29 MED ORDER — NICOTINE 21 MG/24HR TD PT24
21.0000 mg | MEDICATED_PATCH | Freq: Every day | TRANSDERMAL | Status: DC
  Administered 2020-12-30 – 2021-01-04 (×6): 21 mg via TRANSDERMAL
  Filled 2020-12-29 (×10): qty 1

## 2020-12-29 NOTE — ED Notes (Signed)
Pt repositioned in bed. Tv turned on for comfort. Call bell in reach

## 2020-12-29 NOTE — ED Notes (Signed)
Lunch tray at bedside, pt refusing to eat at this time.

## 2020-12-29 NOTE — Progress Notes (Signed)
Patient ID: Charles Ray, male   DOB: 20-Oct-1950, 70 y.o.   MRN: [id number] [facility] PROGRESS NOTE  KISHON GARRIGA NWG:[id number] DOB: 02-May-1950 DOA: 12/28/2020 PCP: Jerl Mina, MD  HPI/Subjective: Patient brought in with weakness and cough found to have pneumonia and COVID-19 infection.  Patient feeling weak.  He does drink alcohol and smoke cigarettes.  Some cough.  No chest pain or shortness of breath.  Objective: Vitals:   12/29/20 1000 12/29/20 1030  BP: (!) 153/80 (!) 147/64  Pulse: 69 71  Resp: 16 14  Temp:    SpO2: 99% 97%    Intake/Output Summary (Last 24 hours) at 12/29/2020 1233 Last data filed at 12/29/2020 0933 Gross per 24 hour  Intake 1199.38 ml  Output 100 ml  Net 1099.38 ml   Filed Weights   12/28/20 1042  Weight: 70.3 kg    ROS: Review of Systems  Constitutional:  Positive for malaise/fatigue.  Respiratory:  Positive for cough. Negative for shortness of breath.   Cardiovascular:  Negative for chest pain.  Gastrointestinal:  Negative for abdominal pain, nausea and vomiting.  Exam: Physical Exam HENT:     Head: Normocephalic.     Mouth/Throat:     Pharynx: No oropharyngeal exudate.  Eyes:     General: Lids are normal.     Conjunctiva/sclera: Conjunctivae normal.  Cardiovascular:     Rate and Rhythm: Normal rate and regular rhythm.     Heart sounds: Normal heart sounds, S1 normal and S2 normal.  Pulmonary:     Breath sounds: Examination of the right-lower field reveals decreased breath sounds. Examination of the left-lower field reveals decreased breath sounds. Decreased breath sounds present. No wheezing, rhonchi or rales.  Abdominal:     Palpations: Abdomen is soft.     Tenderness: There is no abdominal tenderness.  Musculoskeletal:     Right lower leg: No swelling.     Left lower leg: No swelling.  Skin:    General: Skin is warm.     Findings: No rash.  Neurological:     Mental Status: He is lethargic.     Comments: Unable to  lift left leg up off bed.  Able to straight leg raise with right leg.  Answer some yes or no questions.  Kept his eyes closed most of my visit      Scheduled Meds:  albuterol  2 puff Inhalation Q6H   amitriptyline  300 mg Oral QHS   enoxaparin (LOVENOX) injection  40 mg Subcutaneous Q24H   folic acid  1 mg Oral Daily   LORazepam  0-4 mg Intravenous Q6H   Followed by   Melene Muller ON 12/30/2020] LORazepam  0-4 mg Intravenous Q12H   multivitamin with minerals  1 tablet Oral Daily   thiamine  100 mg Oral Daily   Or   thiamine  100 mg Intravenous Daily   Continuous Infusions:  azithromycin Stopped (12/28/20 1851)   cefTRIAXone (ROCEPHIN)  IV Stopped (12/28/20 1823)   remdesivir 100 mg in NS 100 mL Stopped (12/29/20 0933)   Brief history: 70 year old man with history of alcohol abuse tobacco abuse previous history of fracture back in 06/2020.  He has been having some issues with memory and weight loss.  Brought in the hospital with weakness and cough and found to have pneumonia and COVID-19 infection.  Admitted 12/28/2020.  Assessment/Plan:  1.  Pneumonia likely secondary to COVID-19 infection, with lethargy.  Started on remdesivir.  Patient was also placed on Rocephin and  Zithromax will continue for now but hopefully can just do a short course of this. 2.  Elevated D-dimer.  CT scan negative for pulmonary embolism.  We will get lower extremity ultrasound. 3.  Weakness.  Physical therapy evaluation 4.  Alcohol abuse.  Alcohol withdrawal protocol 5.  History of hepatitis C. 6.  Memory loss.  We will get B12 level, RPR and TSH 7.  Tobacco abuse we will start nicotine patch     Code Status:     Code Status Orders  (From admission, onward)           Start     Ordered   12/28/20 1934  Full code  Continuous        12/28/20 1938           Code Status History     Date Active Date Inactive Code Status Order ID Comments User Context   06/18/2020 2151 06/29/2020 2154 Full Code  [id number]  Cox, Nadyne Coombes, DO ED      Advance Directive Documentation    Flowsheet Row Most Recent Value  Type of Advance Directive Healthcare Power of Attorney  Pre-existing out of facility DNR order (yellow form or pink MOST form) --  "MOST" Form in Place? --      Family Communication: Spoke with son at the bedside Disposition Plan: Status is: Observation  Dispo: The patient is from: [facility] room              Anticipated d/c is to: Likely will end up needing rehab              Patient currently being treated for COVID-19 pneumonia and weakness   Difficult to place patient.  Yes.  Antibiotics: Rocephin, Zithromax, remdesivir  Time spent: 27 minutes  [employer]

## 2020-12-29 NOTE — ED Notes (Signed)
Pt resting at this time, RR even and unlabored.

## 2020-12-29 NOTE — ED Notes (Signed)
Pt provided breakfast tray.

## 2020-12-29 NOTE — Progress Notes (Signed)
PT Cancellation Note  Patient Details Name: Charles Ray MRN: [id number] DOB: 1950/04/18   Cancelled Treatment:    Reason Eval/Treat Not Completed: Fatigue/lethargy limiting ability to participate;Patient's level of consciousness (Chart reviewed, evaluation attempted. Pt answers questioning regarding his name, nothing else. No evidence he awakened during the few minutes author was in room. Will attempt evaluation again at later date/time.)   9:28 AM, 12/29/20 Rosamaria Lints, PT, DPT Physical Therapist - [facility]  [phone] (ASCOM)     Sloane Palmer C 12/29/2020, 9:28 AM

## 2020-12-29 NOTE — ED Notes (Signed)
Pt provided ice chips.

## 2020-12-29 NOTE — ED Notes (Signed)
Son Barbara Cower updated by phone to best of this RN ability.  Son at bedside updated to best of this RN ability. Son reports that pt does not have home to go to, was living with wife and now is not legally able to go back. RN expressed concern with pt able to care for self, son agrees. Dr Renae Gloss at bedside and notified of placement concern

## 2020-12-29 NOTE — ED Notes (Signed)
Pt brief and linen changed  

## 2020-12-29 NOTE — ED Notes (Addendum)
Upon this RN initial assessment of pt, pt found to be sitting on edge of bed with blood on linen, water spilled on floor, pants and brief soiled with urine, IV catheter laying on pants that was reported to have been removed by pt on previous shift. Pt linen changed, gown changed, brief changed and peri care provided. Assisted with urinal use. Provided warm blanket. Hooked up to clean BP cuff. Water cleaned up.  Pt disoriented to time and situation

## 2020-12-29 NOTE — Evaluation (Signed)
Occupational Therapy Evaluation Patient Details Name: Charles Ray MRN: [id number] DOB: [birth year] Today's Date: 12/29/2020   History of Present Illness 70 y.o. male with medical history significant of previous hip fracture in 06/2020, alcohol use, tobacco use, patient is currently living with his son in a motel.  He was previously living with his wife until a week ago.  Review of records indicate that patient has been having some issues with his memory as well as having significant weight loss.  He reports that for the past week he has had decreased p.o. intake and lack of appetite.  He has had a cough which has been nonproductive.  Denies any fever or shortness of breath.  Denies any dysuria.  No nausea or vomiting.  He has been increasingly weak.  Records indicate that family has had difficulty getting him up.  He does have some chronic issues with hip pain from his surgery earlier this year.  It has been even harder to get him up and moving now.   Clinical Impression   Patient presenting with decreased Ind in self care, balance, functional mobility/transfers, endurance, and safety awareness.Patient reports living at home with wife until 1 week ago. Son present to confirm pt baseline of him ambulating with [facility] at baseline. Pt has been living in hotel with son for ~ 1 week. He is very deconditioned and week requring mod - max A for bed mobility and mod A of 2 for side steps. Pt needing assistance cuing sequencing and initiation. His son would like to take him to his home after he is able to get stronger with therapy.  Patient will benefit from acute OT to increase overall independence in the areas of ADLs, functional mobility, and safety awareness in order to safely discharge to next venue of care.      Recommendations for follow up therapy are one component of a multi-disciplinary discharge planning process, led by the attending physician.  Recommendations may be updated based on patient status,  additional functional criteria and insurance authorization.   Follow Up Recommendations  SNF;Supervision/Assistance - 24 hour    Equipment Recommendations  Other (comment) (defer to next venue of care)       Precautions / Restrictions Precautions Precautions: Fall      Mobility Bed Mobility Overal bed mobility: Needs Assistance Bed Mobility: Supine to Sit;Sit to Supine     Supine to sit: Mod assist Sit to supine: Mod assist   General bed mobility comments: assist for trunk support and B LEs    Transfers Overall transfer level: Needs assistance Equipment used: 2 person hand held assist Transfers: Sit to/from Stand;Stand Pivot Transfers Sit to Stand: Mod assist Stand pivot transfers: Min assist;+2 physical assistance;Mod assist       General transfer comment: pt needing max mutlimodal cuing for sequencing and weight shift for side steps along EOB    Balance Overall balance assessment: Needs assistance Sitting-balance support: Feet supported Sitting balance-Leahy Scale: Fair     Standing balance support: During functional activity Standing balance-Leahy Scale: Poor                             ADL either performed or assessed with clinical judgement   ADL Overall ADL's : Needs assistance/impaired  General ADL Comments: Pt is very weak and need mod - max A for self care tasks.     Vision Patient Visual Report: No change from baseline              Pertinent Vitals/Pain Pain Assessment: No/denies pain     Hand Dominance Right   Extremity/Trunk Assessment Upper Extremity Assessment Upper Extremity Assessment: Generalized weakness   Lower Extremity Assessment Lower Extremity Assessment: Defer to PT evaluation       Communication Communication Communication: No difficulties   Cognition Arousal/Alertness: Awake/alert Behavior During Therapy: WFL for tasks assessed/performed Overall  Cognitive Status: Within Functional Limits for tasks assessed                                                Home Living Family/patient expects to be discharged to:: Skilled nursing facility                                        Prior Functioning/Environment Level of Independence: Independent with assistive device(s)        Comments: Pt uses [facility] at baseline. He was living with his wife but is unable to stay there further. He has been staying with his son in a motel for the last week. Son would like to take pt back to his home in [place], [place] but he lives in a beach house and has to be able to do many stairs.        OT Problem List: Decreased strength;Decreased activity tolerance;Impaired balance (sitting and/or standing);Decreased safety awareness;Pain;Cardiopulmonary status limiting activity;Decreased knowledge of use of DME or AE;Decreased coordination      OT Treatment/Interventions: Self-care/ADL training;Energy conservation;DME and/or AE instruction;Therapeutic activities;Balance training;Patient/family education;Modalities;Manual therapy;Visual/perceptual remediation/compensation;Therapeutic exercise    OT Goals(Current goals can be found in the care plan section) Acute Rehab OT Goals Patient Stated Goal: to get stronger OT Goal Formulation: With patient/family Time For Goal Achievement: 01/12/21 Potential to Achieve Goals: Good ADL Goals Pt Will Perform Grooming: with supervision;standing Pt Will Perform Lower Body Dressing: with supervision;sit to/from stand Pt Will Transfer to Toilet: with supervision;ambulating Pt Will Perform Toileting - Clothing Manipulation and hygiene: with supervision;sit to/from stand  OT Frequency: Min 2X/week   Barriers to D/C: Other (comment)  Pt is unable to return to his home with wife          AM-PAC OT "6 Clicks" Daily Activity     Outcome Measure Help from another person eating meals?:  None Help from another person taking care of personal grooming?: A Little Help from another person toileting, which includes using toliet, bedpan, or urinal?: A Lot Help from another person bathing (including washing, rinsing, drying)?: A Lot Help from another person to put on and taking off regular upper body clothing?: A Lot Help from another person to put on and taking off regular lower body clothing?: A Lot 6 Click Score: 15   End of Session Nurse Communication: Mobility status  Activity Tolerance: Patient limited by fatigue Patient left: in bed;with call bell/phone within reach;with family/visitor present;with bed alarm set  OT Visit Diagnosis: Unsteadiness on feet (R26.81);Muscle weakness (generalized) (M62.81);Repeated falls (R29.6);History of falling (Z91.81)                Time:  4967-5916 OT Time Calculation (min): 30 min Charges:  OT General Charges $OT Visit: 1 Visit OT Evaluation $OT Eval Moderate Complexity: 1 Mod OT Treatments $Self Care/Home Management : 8-22 mins $Therapeutic Activity: 8-22 mins  Jackquline Denmark, MS, OTR/L , CBIS ascom [phone]  12/29/20, 4:07 PM

## 2020-12-30 DIAGNOSIS — E43 Unspecified severe protein-calorie malnutrition: Secondary | ICD-10-CM

## 2020-12-30 DIAGNOSIS — U071 COVID-19: Secondary | ICD-10-CM | POA: Diagnosis not present

## 2020-12-30 DIAGNOSIS — F1010 Alcohol abuse, uncomplicated: Secondary | ICD-10-CM | POA: Diagnosis not present

## 2020-12-30 DIAGNOSIS — R531 Weakness: Secondary | ICD-10-CM | POA: Diagnosis not present

## 2020-12-30 DIAGNOSIS — R627 Adult failure to thrive: Secondary | ICD-10-CM

## 2020-12-30 DIAGNOSIS — J1282 Pneumonia due to coronavirus disease 2019: Secondary | ICD-10-CM | POA: Diagnosis not present

## 2020-12-30 LAB — PROCALCITONIN: Procalcitonin: <0.1 ng/mL

## 2020-12-30 LAB — CBC WITH DIFFERENTIAL/PLATELET
Abs Immature Granulocytes: 0.09 10*3/uL — ABNORMAL HIGH (ref 0.00–0.07)
Basophils Absolute: 0.1 10*3/uL (ref 0.0–0.1)
Basophils Relative: 1 %
Eosinophils Absolute: 0.2 10*3/uL (ref 0.0–0.5)
Eosinophils Relative: 2 %
HCT: 38.2 % — ABNORMAL LOW (ref 39.0–52.0)
Hemoglobin: 12.5 g/dL — ABNORMAL LOW (ref 13.0–17.0)
Immature Granulocytes: 1 %
Lymphocytes Relative: 18 %
Lymphs Abs: 1.8 10*3/uL (ref 0.7–4.0)
MCH: 29.6 pg (ref 26.0–34.0)
MCHC: 32.7 g/dL (ref 30.0–36.0)
MCV: 90.3 fL (ref 80.0–100.0)
Monocytes Absolute: 0.8 10*3/uL (ref 0.1–1.0)
Monocytes Relative: 8 %
Neutro Abs: 7.3 10*3/uL (ref 1.7–7.7)
Neutrophils Relative %: 70 %
Platelets: 472 10*3/uL — ABNORMAL HIGH (ref 150–400)
RBC: 4.23 MIL/uL (ref 4.22–5.81)
RDW: 14.6 % (ref 11.5–15.5)
WBC: 10.3 10*3/uL (ref 4.0–10.5)
nRBC: 0 % (ref 0.0–0.2)

## 2020-12-30 LAB — COMPREHENSIVE METABOLIC PANEL
ALT: 11 U/L (ref 0–44)
AST: 13 U/L — ABNORMAL LOW (ref 15–41)
Albumin: 2.7 g/dL — ABNORMAL LOW (ref 3.5–5.0)
Alkaline Phosphatase: 116 U/L (ref 38–126)
Anion gap: 7 (ref 5–15)
BUN: 24 mg/dL — ABNORMAL HIGH (ref 8–23)
CO2: 26 mmol/L (ref 22–32)
Calcium: 8.5 mg/dL — ABNORMAL LOW (ref 8.9–10.3)
Chloride: 105 mmol/L (ref 98–111)
Creatinine, Ser: 0.86 mg/dL (ref 0.61–1.24)
GFR, Estimated: >60 mL/min (ref >60)
Glucose, Bld: 71 mg/dL (ref 70–99)
Potassium: 3.9 mmol/L (ref 3.5–5.1)
Sodium: 138 mmol/L (ref 135–145)
Total Bilirubin: 0.7 mg/dL (ref 0.3–1.2)
Total Protein: 6.3 g/dL — ABNORMAL LOW (ref 6.5–8.1)

## 2020-12-30 LAB — VITAMIN B12: Vitamin B-12: 872 pg/mL (ref 180–914)

## 2020-12-30 LAB — RPR: RPR Ser Ql: NONREACTIVE

## 2020-12-30 LAB — LEGIONELLA PNEUMOPHILA SEROGP 1 UR AG: L. pneumophila Serogp 1 Ur Ag: NEGATIVE

## 2020-12-30 LAB — MAGNESIUM: Magnesium: 1.9 mg/dL (ref 1.7–2.4)

## 2020-12-30 LAB — TSH: TSH: 2.301 u[IU]/mL (ref 0.350–4.500)

## 2020-12-30 LAB — FERRITIN: Ferritin: 417 ng/mL — ABNORMAL HIGH (ref 24–336)

## 2020-12-30 LAB — D-DIMER, QUANTITATIVE: D-Dimer, Quant: 1.32 ug/mL-FEU — ABNORMAL HIGH (ref 0.00–0.50)

## 2020-12-30 LAB — C-REACTIVE PROTEIN: CRP: 3.8 mg/dL — ABNORMAL HIGH (ref <1.0)

## 2020-12-30 LAB — PHOSPHORUS: Phosphorus: 3.1 mg/dL (ref 2.5–4.6)

## 2020-12-30 MED ORDER — DRONABINOL 2.5 MG PO CAPS
2.5000 mg | ORAL_CAPSULE | Freq: Two times a day (BID) | ORAL | Status: DC
  Administered 2020-12-30 – 2021-01-01 (×6): 2.5 mg via ORAL
  Filled 2020-12-30 (×6): qty 1

## 2020-12-30 MED ORDER — ENSURE ENLIVE PO LIQD
237.0000 mL | Freq: Three times a day (TID) | ORAL | Status: DC
  Administered 2020-12-30 – 2021-01-08 (×24): 237 mL via ORAL

## 2020-12-30 MED ORDER — AMOXICILLIN-POT CLAVULANATE 875-125 MG PO TABS
1.0000 | ORAL_TABLET | Freq: Two times a day (BID) | ORAL | Status: AC
  Administered 2020-12-30 – 2021-01-02 (×8): 1 via ORAL
  Filled 2020-12-30 (×8): qty 1

## 2020-12-30 MED ORDER — AZITHROMYCIN 500 MG PO TABS
500.0000 mg | ORAL_TABLET | ORAL | Status: DC
Start: 2020-12-30 — End: 2020-12-30

## 2020-12-30 MED ORDER — ENSURE ENLIVE PO LIQD
237.0000 mL | Freq: Two times a day (BID) | ORAL | Status: DC
  Administered 2020-12-30: 237 mL via ORAL

## 2020-12-30 NOTE — Evaluation (Signed)
Physical Therapy Evaluation Patient Details Name: Charles Ray MRN: [id number] DOB: 1951/02/10 Today's Date: 12/30/2020  History of Present Illness  70 y.o. male with medical history significant of previous hip fracture in 06/2020 (s/p L THA anterior approach 06/19/20), alcohol use, tobacco use, patient is currently living with his son in a motel.  He was previously living with his wife until a week ago.  Review of records indicate that patient has been having some issues with his memory as well as having significant weight loss.  He reports that for the past week he has had decreased p.o. intake and lack of appetite.  He has had a cough which has been nonproductive.  Denies any fever or shortness of breath.  Denies any dysuria.  No nausea or vomiting.  He has been increasingly weak.  Records indicate that family has had difficulty getting him up.  He does have some chronic issues with hip pain from his surgery earlier this year.  It has been even harder to get him up and moving now.  Clinical Impression  Prior to hospital admission, pt was ambulatory with [facility] and living in motel with his son for past week.  Pt appearing with general confusion during session.  Currently pt is mod assist with bed mobility and mod assist with transfers using RW.  Pt was unsteady in standing and unable to gain balance with max cueing, assist, and walker use so therapist assisted pt with sitting back down onto bed safely and then assisted pt back into bed.  Pt would benefit from skilled PT to address noted impairments and functional limitations (see below for any additional details).  Upon hospital discharge, pt would benefit from SNF.      Recommendations for follow up therapy are one component of a multi-disciplinary discharge planning process, led by the attending physician.  Recommendations may be updated based on patient status, additional functional criteria and insurance authorization.  Follow Up Recommendations  SNF    Equipment Recommendations  Rolling walker with 5" wheels;3in1 (PT);Wheelchair (measurements PT);Wheelchair cushion (measurements PT)    Recommendations for Other Services OT consult     Precautions / Restrictions Precautions Precautions: Fall Restrictions Weight Bearing Restrictions: No      Mobility  Bed Mobility Overal bed mobility: Needs Assistance Bed Mobility: Supine to Sit;Sit to Supine     Supine to sit: Mod assist Sit to supine: Mod assist   General bed mobility comments: assist for trunk and B LE's    Transfers Overall transfer level: Needs assistance Equipment used: Rolling walker (2 wheeled) Transfers: Sit to/from Stand Sit to Stand: Mod assist         General transfer comment: vc's for UE/LE placement and overall technique; assist to initiate stand up to RW and control descent sitting  Ambulation/Gait             General Gait Details: deferred d/t pt unsteady in standing and not safe to attempt to take any steps  Stairs            Wheelchair Mobility    Modified Rankin (Stroke Patients Only)       Balance Overall balance assessment: Needs assistance Sitting-balance support: No upper extremity supported;Feet supported Sitting balance-Leahy Scale: Fair Sitting balance - Comments: steady static sitting     Standing balance-Leahy Scale: Poor Standing balance comment: pt unsteady in standing and unable to gain balance with max cueing, assist, and walker use  Pertinent Vitals/Pain Pain Assessment: No/denies pain Vitals (HR and O2 on room air) stable and WFL throughout treatment session.    Home Living Family/patient expects to be discharged to:: Other (Comment) Living Arrangements: Other (Comment) (Motel with pt's son) Available Help at Discharge: Family           Home Equipment: [employer] Mor - single point      Prior Function Level of Independence: Independent with assistive device(s)          Comments: Pt unable to provide much information regarding home set up or PLOF (pt appearing confused) but per OT eval "Pt uses [facility] at baseline. He was living with his wife but is unable to stay there further. He has been staying with his son in a motel for the last week. Son would like to take pt back to his home in [place], [place] but he lives in a beach house and has to be able to do many stairs."     Hand Dominance   Dominant Hand: Right    Extremity/Trunk Assessment   Upper Extremity Assessment Upper Extremity Assessment: Generalized weakness    Lower Extremity Assessment Lower Extremity Assessment: Generalized weakness       Communication   Communication: No difficulties  Cognition Arousal/Alertness: Awake/alert Behavior During Therapy: WFL for tasks assessed/performed Overall Cognitive Status: No family/caregiver present to determine baseline cognitive functioning                                 General Comments: Oriented to person, place, general situation.  General confusion noted.      General Comments  Nursing cleared pt for participation in physical therapy.  Pt agreeable to PT session.    Exercises     Assessment/Plan    PT Assessment Patient needs continued PT services  PT Problem List Decreased strength;Decreased activity tolerance;Decreased balance;Decreased mobility;Decreased knowledge of use of DME;Decreased safety awareness;Decreased knowledge of precautions       PT Treatment Interventions DME instruction;Gait training;Stair training;Functional mobility training;Therapeutic activities;Therapeutic exercise;Balance training;Patient/family education    PT Goals (Current goals can be found in the Care Plan section)  Acute Rehab PT Goals Patient Stated Goal: to improve mobility PT Goal Formulation: With patient Time For Goal Achievement: 01/13/21 Potential to Achieve Goals: Good    Frequency Min 2X/week   Barriers to  discharge Decreased caregiver support      Co-evaluation               AM-PAC PT "6 Clicks" Mobility  Outcome Measure Help needed turning from your back to your side while in a flat bed without using bedrails?: A Little Help needed moving from lying on your back to sitting on the side of a flat bed without using bedrails?: A Lot Help needed moving to and from a bed to a chair (including a wheelchair)?: Total Help needed standing up from a chair using your arms (e.g., wheelchair or bedside chair)?: A Lot Help needed to walk in hospital room?: Total Help needed climbing 3-5 steps with a railing? : Total 6 Click Score: 10    End of Session Equipment Utilized During Treatment: Gait belt Activity Tolerance: Patient limited by fatigue Patient left: in bed;with call bell/phone within reach;with bed alarm set Nurse Communication: Mobility status;Precautions PT Visit Diagnosis: Unsteadiness on feet (R26.81);Muscle weakness (generalized) (M62.81);History of falling (Z91.81);Other abnormalities of gait and mobility (R26.89)    Time: 3810-1751 PT Time Calculation (  min) (ACUTE ONLY): 35 min   Charges:   PT Evaluation $PT Eval Low Complexity: 1 Low PT Treatments $Therapeutic Activity: 8-22 mins       Hendricks Limes, PT 12/30/20, 3:44 PM

## 2020-12-30 NOTE — TOC Initial Note (Signed)
Transition of Care [facility]) - Initial/Assessment Note    Patient Details  Name: Charles Ray MRN: [id number] Date of Birth: 1950-10-27  Transition of Care [facility]) CM/SW Contact:    Allayne Butcher, RN Phone Number: 12/30/2020, 3:34 PM  Clinical Narrative:                 Patient admitted to the hospital with COVID.  RNCM was able to speak with patient's son, Charles Ray at the beside.  Patient is confused.  Aman reports that a couple of weeks ago patient's wife filed a restraining order and then patient was arrested.  After that patient has been living with his middle son in a hotel room until he got to the point where he could not stand and was not eating.  Patient does have a history of alcohol and tobacco abuse.    Charles Ray lives in [place] and would like to see the patient get some rehab and then he may be willing to take the patient home with him.   TOC will start bed search for SNF.  Patient will have to finish quarantine here in the hospital before a SNF will take him.     Expected Discharge Plan: Skilled Nursing Facility Barriers to Discharge: Continued Medical Work up   Patient Goals and CMS Choice Patient states their goals for this hospitalization and ongoing recovery are:: Patient unable to state goals but patient's son would like him to go to rehab and then possibly go live with him in [facility].gov Compare Post Acute Care list provided to:: Patient Represenative (must comment) Choice offered to / list presented to : Adult Children  Expected Discharge Plan and Services Expected Discharge Plan: Skilled Nursing Facility   Discharge Planning Services: CM Consult Post Acute Care Choice: Skilled Nursing Facility Living arrangements for the past 2 months: Single Family Home                 DME Arranged: N/A DME Agency: NA       HH Arranged: NA HH Agency: NA        Prior Living Arrangements/Services Living arrangements for the past 2 months: Single  Family Home Lives with:: Spouse Patient language and need for interpreter reviewed:: Yes Do you feel safe going back to the place where you live?: Yes      Need for Family Participation in Patient Care: Yes (Comment) (COVID) Care giver support system in place?: Yes (comment) (sons) Current home services: DME (cane) Criminal Activity/Legal Involvement Pertinent to Current Situation/Hospitalization: No - Comment as needed  Activities of Daily Living Home Assistive Devices/Equipment: None ADL Screening (condition at time of admission) Patient's cognitive ability adequate to safely complete daily activities?: No Is the patient deaf or have difficulty hearing?: Yes Does the patient have difficulty seeing, even when wearing glasses/contacts?: Yes Does the patient have difficulty concentrating, remembering, or making decisions?: Yes Patient able to express need for assistance with ADLs?: Yes Does the patient have difficulty dressing or bathing?: Yes Independently performs ADLs?: No Communication: Needs assistance Dressing (OT): Needs assistance Grooming: Needs assistance Feeding: Needs assistance Bathing: Needs assistance Toileting: Needs assistance In/Out Bed: Needs assistance Walks in Home: Needs assistance Does the patient have difficulty walking or climbing stairs?: Yes Weakness of Legs: Both Weakness of Arms/Hands: None  Permission Sought/Granted Permission sought to share information with : Case Manager, Family Supports, Magazine features editor Permission granted to share information with : Yes, Verbal Permission Granted  Share Information with  NAME: Charles Ray  Permission granted to share info w AGENCY: SNF's  Permission granted to share info w Relationship: son     Emotional Assessment Appearance:: Appears older than stated age Attitude/Demeanor/Rapport: Unable to Assess Affect (typically observed): Unable to Assess Orientation: : Oriented to Self Alcohol /  Substance Use: Tobacco Use, Alcohol Use Psych Involvement: No (comment)  Admission diagnosis:  Weakness [R53.1] Elevated d-dimer [R79.89] Community acquired pneumonia, unspecified laterality [J18.9] Sepsis, due to unspecified organism, unspecified whether acute organ dysfunction present (HCC) [A41.9] Pneumonia due to COVID-19 virus [U07.1, J12.82] COVID-19 [U07.1] Patient Active Problem List   Diagnosis Date Noted   Failure to thrive in adult 12/30/2020   Severe protein-calorie malnutrition (HCC) 12/30/2020   Lethargy    Elevated d-dimer    Memory loss    Pneumonia due to COVID-19 virus 12/28/2020   Weakness 12/28/2020   Closed fracture of left hip (HCC)    Fall    Femur fracture, left (HCC) 06/18/2020   Alcohol abuse 06/18/2020   Family history of colon cancer 07/05/2013   Tests ordered 07/04/2012   Hepatitis C 12/27/2011   History of alcohol abuse 12/27/2011   Tobacco abuse 12/27/2011   Chronic pain 12/27/2011   PCP:  Jerl Mina, MD Pharmacy:   [facility] [phone] - [place], [place] - [address] [place] [address] Phone: [phone] Fax: [phone]     Social Determinants of Health (SDOH) Interventions    Readmission Risk Interventions No flowsheet data found.

## 2020-12-30 NOTE — [place] FL2 (Signed)
[place] MEDICAID FL2 LEVEL OF CARE SCREENING TOOL     IDENTIFICATION  Patient Name: Charles Ray Birthdate: [birth year] Sex: male Admission Date (Current Location): 12/28/2020  [facility] and [place] Number:  Chiropodist and Address:  [facility], [address] [place], [place] [address]      Provider Number: [id number]  Attending Physician Name and Address:  Marrion Coy, MD  Relative Name and Phone Number:       Current Level of Care: Hospital Recommended Level of Care: Skilled Nursing Facility Prior Approval Number:    Date Approved/Denied:   PASRR Number: Manual review  Discharge Plan: SNF    Current Diagnoses: Patient Active Problem List   Diagnosis Date Noted   Failure to thrive in adult 12/30/2020   Severe protein-calorie malnutrition (HCC) 12/30/2020   Lethargy    Elevated d-dimer    Memory loss    Pneumonia due to COVID-19 virus 12/28/2020   Weakness 12/28/2020   Closed fracture of left hip (HCC)    Fall    Femur fracture, left (HCC) 06/18/2020   Alcohol abuse 06/18/2020   Family history of colon cancer 07/05/2013   Tests ordered 07/04/2012   Hepatitis C 12/27/2011   History of alcohol abuse 12/27/2011   Tobacco abuse 12/27/2011   Chronic pain 12/27/2011    Orientation RESPIRATION BLADDER Height & Weight     Self  Normal Incontinent, External catheter Weight: 155 lb (70.3 kg) Height:  6\' 5"  (195.6 cm)  BEHAVIORAL SYMPTOMS/MOOD NEUROLOGICAL BOWEL NUTRITION STATUS   (None)  (None) Continent Diet (Regular)  AMBULATORY STATUS COMMUNICATION OF NEEDS Skin   Limited Assist Verbally Normal                       Personal Care Assistance Level of Assistance  Bathing, Feeding, Dressing Bathing Assistance: Maximum assistance Feeding assistance: Limited assistance Dressing Assistance: Maximum assistance     Functional Limitations Info  Sight, Hearing, Speech Sight Info: Adequate Hearing Info:  Adequate Speech Info: Adequate    SPECIAL CARE FACTORS FREQUENCY  PT (By licensed PT), OT (By licensed OT)     PT Frequency: 5 x week OT Frequency: 5 x week            Contractures Contractures Info: Not present    Additional Factors Info  Code Status, Allergies Code Status Info: Full code Allergies Info: NKDA           Current Medications (12/30/2020):  This is the current hospital active medication list Current Facility-Administered Medications  Medication Dose Route Frequency Provider Last Rate Last Admin   acetaminophen (TYLENOL) tablet 650 mg  650 mg Oral Q6H PRN 01/01/2021, MD       albuterol (VENTOLIN HFA) 108 (90 Base) MCG/ACT inhaler 2 puff  2 puff Inhalation Q6H Erick Blinks, MD   2 puff at 12/30/20 1421   amitriptyline (ELAVIL) tablet 300 mg  300 mg Oral QHS 01/01/21, MD   300 mg at 12/29/20 2022   amoxicillin-clavulanate (AUGMENTIN) 875-125 MG per tablet 1 tablet  1 tablet Oral BID 2023, MD   1 tablet at 12/30/20 1212   chlorpheniramine-HYDROcodone (TUSSIONEX) 10-8 MG/5ML suspension 5 mL  5 mL Oral Q12H PRN 1213, MD       dronabinol (MARINOL) capsule 2.5 mg  2.5 mg Oral BID AC Erick Blinks, MD   2.5 mg at 12/30/20 1212   enoxaparin (LOVENOX) injection 40 mg  40 mg Subcutaneous  Q24H Erick Blinks, MD   40 mg at 12/29/20 2022   feeding supplement (ENSURE ENLIVE / ENSURE PLUS) liquid 237 mL  237 mL Oral TID BM Marrion Coy, MD       folic acid (FOLVITE) tablet 1 mg  1 mg Oral Daily Erick Blinks, MD   1 mg at 12/30/20 1014   guaiFENesin-dextromethorphan (ROBITUSSIN DM) 100-10 MG/5ML syrup 10 mL  10 mL Oral Q4H PRN Erick Blinks, MD   10 mL at 12/29/20 4580   HYDROcodone-acetaminophen (NORCO/VICODIN) 5-325 MG per tablet 1-2 tablet  1-2 tablet Oral Q6H PRN Erick Blinks, MD   1 tablet at 12/28/20 2001   LORazepam (ATIVAN) injection 0-4 mg  0-4 mg Intravenous Q6H Erick Blinks, MD   1 mg at 12/30/20 0111   Followed by    LORazepam (ATIVAN) injection 0-4 mg  0-4 mg Intravenous Q12H Erick Blinks, MD       LORazepam (ATIVAN) tablet 1-4 mg  1-4 mg Oral Q1H PRN Erick Blinks, MD   2 mg at 12/29/20 9983   Or   LORazepam (ATIVAN) injection 1-4 mg  1-4 mg Intravenous Q1H PRN Erick Blinks, MD       multivitamin with minerals tablet 1 tablet  1 tablet Oral Daily Erick Blinks, MD   1 tablet at 12/30/20 1014   nicotine (NICODERM CQ - dosed in mg/24 hours) patch 21 mg  21 mg Transdermal Daily Alford Highland, MD   21 mg at 12/30/20 1018   ondansetron (ZOFRAN) tablet 4 mg  4 mg Oral Q6H PRN Erick Blinks, MD       Or   ondansetron (ZOFRAN) injection 4 mg  4 mg Intravenous Q6H PRN Erick Blinks, MD       remdesivir 100 mg in sodium chloride 0.9 % 100 mL IVPB  100 mg Intravenous Daily Erick Blinks, MD 200 mL/hr at 12/30/20 1017 100 mg at 12/30/20 1017   thiamine tablet 100 mg  100 mg Oral Daily Erick Blinks, MD   100 mg at 12/30/20 1014   Or   thiamine (B-1) injection 100 mg  100 mg Intravenous Daily Erick Blinks, MD   100 mg at 12/28/20 1959     Discharge Medications: Please see discharge summary for a list of discharge medications.  Relevant Imaging Results:  Relevant Lab Results:   Additional Information SS#: [id number]. Tested positive for COVID on 9/19. Per chart review, as of March, patient has had two COVID vaccines and one booster. Dates unknown.  Margarito Liner, LCSW

## 2020-12-30 NOTE — TOC CM/SW Note (Signed)
RE: Charles Ray Date of Birth: [birth year] Date: 12/30/2020   To Whom It May Concern:  Please be advised that the above-named patient will require a short-term nursing home stay - anticipated 30 days or less for rehabilitation and strengthening.  The plan is for return home.

## 2020-12-30 NOTE — Progress Notes (Signed)
Initial Nutrition Assessment  DOCUMENTATION CODES:  Severe malnutrition in context of chronic illness, Underweight  INTERVENTION:  Obtain updated weight.  Increase Ensure from BID to TID.  Add Magic cup TID with meals, each supplement provides 290 kcal and 9 grams of protein.  Continue CIWA protocol.  NUTRITION DIAGNOSIS:  Severe Malnutrition related to chronic illness (EtOH abuse) as evidenced by severe fat depletion, severe muscle depletion.  GOAL:  Patient will meet greater than or equal to 90% of their needs  MONITOR:  PO intake, Supplement acceptance, Labs, Weight trends, I & O's  REASON FOR ASSESSMENT:  Malnutrition Screening Tool    ASSESSMENT:  70 yo male with a PMH of alcohol abuse tobacco abuse previous history of fracture back in 06/2020. He has been having some issues with memory and weight loss. Brought in the hospital with weakness and cough and found to have pneumonia and COVID-19 infection.  Attempted to speak with pt at bedside. Pt reports that he wanted a hamburger, and continued to discuss hamburgers, rather than answering RD's questions. RD did not gather much nutrition history from the patient.  Pt's weight appears to be stated. RD to order measured weight to determine any weight changes.  Recommend increasing Ensure from BID to TID and Magic Cup TID, as well as MVI with minerals.  Supplements: Ensure BID  Medications: reviewed; Augmentin BID, Marinol BID, Ativan, MVI with minerals, thiamine  Labs: reviewed; BUN 24 (H)  NUTRITION - FOCUSED PHYSICAL EXAM: Flowsheet Row Most Recent Value  Orbital Region Severe depletion  Upper Arm Region Severe depletion  Thoracic and Lumbar Region Severe depletion  Buccal Region Severe depletion  Temple Region Severe depletion  Clavicle Bone Region Moderate depletion  Clavicle and Acromion Bone Region Severe depletion  Scapular Bone Region Severe depletion  Dorsal Hand Severe depletion  Patellar Region Severe  depletion  Anterior Thigh Region Severe depletion  Posterior Calf Region Severe depletion  Edema (RD Assessment) None  Hair Reviewed  Eyes Reviewed  Mouth Reviewed  Skin Reviewed  Nails Reviewed   Diet Order:   Diet Order             Diet regular Room service appropriate? Yes; Fluid consistency: Thin  Diet effective now                  EDUCATION NEEDS:  Not appropriate for education at this time  Skin:  Skin Assessment: Reviewed RN Assessment  Last BM:  unknown/PTA  Height:  Ht Readings from Last 1 Encounters:  12/28/20 6\' 5"  (1.956 m)   Weight:  Wt Readings from Last 1 Encounters:  12/28/20 70.3 kg   BMI:  Body mass index is 18.38 kg/m.  Estimated Nutritional Needs:  Kcal:  2100-2300 Protein:  95-110 grams Fluid:  >2.1 L  12/30/20, RD, LDN (she/her/hers) Registered Dietitian I After-Hours/Weekend Pager # in [place]

## 2020-12-30 NOTE — Progress Notes (Addendum)
PROGRESS NOTE    Charles Ray  HUD:[id number] DOB: 1950-06-03 DOA: 12/28/2020 PCP: Jerl Mina, MD   Follow-up on COVID-pneumonia. Brief Narrative:  70 year old man with history of alcohol abuse tobacco abuse previous history of fracture back in 06/2020.  He has been having some issues with memory and weight loss.  Brought in the hospital with weakness and cough and found to have pneumonia and COVID-19 infection.  Admitted 12/28/2020. Patient is treated with remdesivir.  Not developed hypoxemia.  Patient was also has significant weakness, seen by PT, recommended nursing placement.  Assessment & Plan:   Active Problems:   Hepatitis C   Tobacco abuse   Alcohol abuse   Pneumonia due to COVID-19 virus   Weakness  #1.  Left lower lobe pneumonia, most likely due to aspiration. COVID-19 pneumonia. I reviewed patient CT scan images, left lower lobe infiltrates, more consistent with aspiration.  Another explanation of the COVID-19 pneumonia. At this point, patient does not have hypoxemia, I will discontinue Zithromax and Rocephin, treated with additional 4 days antibiotics with Augmentin.  Procalcitonin level not elevated. Obtain speech therapy evaluation. I will continue finish up the course of remdesivir, no indication for steroids.  #2.  Failure to thrive. Anorexia. Severe protein calorie malnutrition. Generalized weakness Patient has been having poor p.o. intake, severely malnourished.  I will start protein supplements, also add Marinol. Continue PT/OT.  Pending nursing home placement.  3.  Alcohol abuse. No evidence of withdrawal. Continue CIWA protocol, continue thiamine and folic acid.  4.  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 B12 level pending. Patient currently has no agitation.  #5 reactive thrombocytosis. Secondary to COVID.  #6.  Left leg pain. Duplex ultrasound did not show any DVT.     DVT prophylaxis: Lovenox Code Status: Full Family Communication:   Disposition Plan:    Status is: Inpatient  Remains inpatient appropriate because:Unsafe d/c plan and Inpatient level of care appropriate due to severity of illness  Dispo: The patient is from: Home              Anticipated d/c is to: SNF              Patient currently is not medically stable to d/c.   Difficult to place patient No        I/O last 3 completed shifts: In: 350 [IV Piggyback:350] Out: 100 [Urine:100] No intake/output data recorded.     Consultants:  None  Procedures: None  Antimicrobials: Augmentin  Subjective: Patient has a very poor appetite, but no nausea vomiting or abdominal pain.  He had some loose stool yesterday. Denies any short of breath, no hypoxia. Cough, nonproductive. No chest pain or palpitation. No dysuria hematuria. No fever or chills. No headache or dizziness. Feels significant weakness, pain in the left leg.  Objective: Vitals:   12/30/20 0110 12/30/20 0610 12/30/20 0611 12/30/20 0803  BP: (!) 161/77 (!) 143/83  (!) 143/68  Pulse: 78 71 70 77  Resp: (!) 22 18  20   Temp: (!) 97.4 F (36.3 C) (!) 97.3 F (36.3 C)    TempSrc: Oral Oral    SpO2: 99%  98% 97%  Weight:      Height:       No intake or output data in the 24 hours ending 12/30/20 0946 Filed Weights   12/28/20 1042  Weight: 70.3 kg    Examination:  General exam: Appears calm and comfortable, severely malnourished. Respiratory system: Decreased breathing sounds. Respiratory effort normal. Cardiovascular  system: S1 & S2 heard, RRR. No JVD, murmurs, rubs, gallops or clicks. No pedal edema. Gastrointestinal system: Abdomen is nondistended, soft and nontender. No organomegaly or masses felt. Normal bowel sounds heard. Central nervous system: Alert and oriented x2. No focal neurological deficits. Extremities: Symmetric 5 x 5 power. Skin: No rashes, lesions or ulcers Psychiatry: Judgement and insight appear normal. Mood & affect appropriate.     Data Reviewed:  I have personally reviewed following labs and imaging studies  CBC: Recent Labs  Lab 12/28/20 1052 12/29/20 0740 12/30/20 0433  WBC 19.1* 11.1* 10.3  NEUTROABS  --  7.8* 7.3  HGB 14.6 13.5 12.5*  HCT 44.0 40.4 38.2*  MCV 92.6 91.0 90.3  PLT 513* 413* 472*   Basic Metabolic Panel: Recent Labs  Lab 12/28/20 1052 12/29/20 0740 12/30/20 0433  NA 137 139 138  K 4.2 3.7 3.9  CL 103 105 105  CO2 22 25 26   GLUCOSE 105* 95 71  BUN 26* 28* 24*  CREATININE 1.09 0.93 0.86  CALCIUM 9.1 8.5* 8.5*  MG  --  2.0 1.9  PHOS  --  3.5 3.1   GFR: Estimated Creatinine Clearance: 77.2 mL/min (by C-G formula based on SCr of 0.86 mg/dL). Liver Function Tests: Recent Labs  Lab 12/28/20 1052 12/29/20 0740 12/30/20 0433  AST 15 18 13*  ALT 11 11 11   ALKPHOS 148* 128* 116  BILITOT 0.7 0.4 0.7  PROT 7.3 6.8 6.3*  ALBUMIN 3.3* 3.0* 2.7*   No results for input(s): LIPASE, AMYLASE in the last 168 hours. No results for input(s): AMMONIA in the last 168 hours. Coagulation Profile: No results for input(s): INR, PROTIME in the last 168 hours. Cardiac Enzymes: No results for input(s): CKTOTAL, CKMB, CKMBINDEX, TROPONINI in the last 168 hours. BNP (last 3 results) No results for input(s): PROBNP in the last 8760 hours. HbA1C: No results for input(s): HGBA1C in the last 72 hours. CBG: No results for input(s): GLUCAP in the last 168 hours. Lipid Profile: No results for input(s): CHOL, HDL, LDLCALC, TRIG, CHOLHDL, LDLDIRECT in the last 72 hours. Thyroid Function Tests: Recent Labs    12/30/20 0433  TSH 2.301   Anemia Panel: Recent Labs    12/29/20 0740 12/30/20 0433  FERRITIN 540* 417*   Sepsis Labs: Recent Labs  Lab 12/28/20 1259 12/28/20 1344 12/28/20 1736 12/29/20 0740 12/30/20 0433  PROCALCITON 0.11  --   --  <0.10 <0.10  LATICACIDVEN  --  2.9* 1.3  --   --     Recent Results (from the past 240 hour(s))  Blood culture (single)     Status: None (Preliminary result)    Collection Time: 12/28/20  1:44 PM   Specimen: BLOOD  Result Value Ref Range Status   Specimen Description BLOOD BLOOD LEFT FOREARM  Final   Special Requests   Final    BOTTLES DRAWN AEROBIC AND ANAEROBIC Blood Culture adequate volume   Culture   Final    NO GROWTH 2 DAYS Performed at [facility], [address] [place], [address] [place]    Report Status PENDING  Incomplete  Resp Panel by RT-PCR (Flu A&B, Covid) Nasopharyngeal Swab     Status: Abnormal   Collection Time: 12/28/20  1:44 PM   Specimen: Nasopharyngeal Swab; Nasopharyngeal(NP) swabs in vial transport medium  Result Value Ref Range Status   SARS Coronavirus 2 by RT PCR POSITIVE (A) NEGATIVE Final    Comment: RESULT CALLED TO, READ BACK BY AND VERIFIED WITH:  MARY FOUNKE AT 1714 ON 12/28/20 BY SS (NOTE) SARS-CoV-2 target nucleic acids are DETECTED.  The SARS-CoV-2 RNA is generally detectable in upper respiratory specimens during the acute phase of infection. Positive results are indicative of the presence of the identified virus, but do not rule out bacterial infection or co-infection with other pathogens not detected by the test. Clinical correlation with patient history and other diagnostic information is necessary to determine patient infection status. The expected result is Negative.  Fact Sheet for Patients: BloggerCourse.com  Fact Sheet for Healthcare Providers: SeriousBroker.it  This test is not yet approved or cleared by the [place] FDA and  has been authorized for detection and/or diagnosis of SARS-CoV-2 by FDA under an Emergency Use Authorization (EUA).  This EUA will remain in effect (meaning this test can  be used) for the duration of  the COVID-19 declaration under Section 564(b)(1) of the Act, 21 U.S.C. section 360bbb-3(b)(1), unless the authorization is terminated or revoked sooner.     Influenza A by PCR NEGATIVE NEGATIVE Final    Influenza B by PCR NEGATIVE NEGATIVE Final    Comment: (NOTE) The Xpert Xpress SARS-CoV-2/FLU/RSV plus assay is intended as an aid in the diagnosis of influenza from Nasopharyngeal swab specimens and should not be used as a sole basis for treatment. Nasal washings and aspirates are unacceptable for Xpert Xpress SARS-CoV-2/FLU/RSV testing.  Fact Sheet for Patients: BloggerCourse.com  Fact Sheet for Healthcare Providers: SeriousBroker.it  This test is not yet approved or cleared by the [place] FDA and has been authorized for detection and/or diagnosis of SARS-CoV-2 by FDA under an Emergency Use Authorization (EUA). This EUA will remain in effect (meaning this test can be used) for the duration of the COVID-19 declaration under Section 564(b)(1) of the Act, 21 U.S.C. section 360bbb-3(b)(1), unless the authorization is terminated or revoked.  Performed at [facility], [address] [place], [place] [address]   Culture, blood (single)     Status: None (Preliminary result)   Collection Time: 12/28/20  5:36 PM   Specimen: BLOOD  Result Value Ref Range Status   Specimen Description BLOOD RIGHT ANTECUBITAL  Final   Special Requests   Final    BOTTLES DRAWN AEROBIC AND ANAEROBIC Blood Culture results may not be optimal due to an excessive volume of blood received in culture bottles   Culture   Final    NO GROWTH 2 DAYS Performed at [facility], [address], [place], [place] [address]    Report Status PENDING  Incomplete         Radiology Studies: DG Chest 2 View  Result Date: 12/28/2020 CLINICAL DATA:  cough EXAM: CHEST - 2 VIEW COMPARISON:  None. FINDINGS: The cardiomediastinal silhouette is within normal limits. No pleural effusion. No pneumothorax. No mass or consolidation. Healing right tenth rib fracture. IMPRESSION: 1. No acute cardiopulmonary radiographic abnormality. 2. Healing right tenth  rib fracture. Electronically Signed   By: Olive Bass M.D.   On: 12/28/2020 11:49   CT HEAD WO CONTRAST ( )  Result Date: 12/28/2020 CLINICAL DATA:  Generalized weakness, status post left hip replacement 2 weeks ago. EXAM: CT HEAD WITHOUT CONTRAST TECHNIQUE: Contiguous axial images were obtained from the base of the skull through the vertex without intravenous contrast. COMPARISON:  September 20, 2018 FINDINGS: Brain: There is mild cerebral atrophy with widening of the extra-axial spaces and ventricular dilatation. There are areas of decreased attenuation within the white matter tracts of the supratentorial brain, consistent with  microvascular disease changes. A chronic right basal ganglia lacunar infarct is seen. Vascular: No hyperdense vessel or unexpected calcification. Skull: Normal. Negative for fracture or focal lesion. Sinuses/Orbits: No acute finding. Other: None. IMPRESSION: 1. Generalized cerebral atrophy. 2. Chronic right basal ganglia lacunar infarct. 3. No acute intracranial abnormality. Electronically Signed   By: Aram Candela M.D.   On: 12/28/2020 16:50   CT Angio Chest PE W and/or Wo Contrast  Result Date: 12/28/2020 CLINICAL DATA:  PE suspected, high probability. Two weeks status post left hip replacement. EXAM: CT ANGIOGRAPHY CHEST WITH CONTRAST TECHNIQUE: Multidetector CT imaging of the chest was performed using the standard protocol during bolus administration of intravenous contrast. Multiplanar CT image reconstructions and MIPs were obtained to evaluate the vascular anatomy. CONTRAST:  18mL OMNIPAQUE IOHEXOL 350 MG/ML SOLN COMPARISON:  None. FINDINGS: Cardiovascular: Satisfactory opacification of the pulmonary arteries to the segmental level. No evidence of pulmonary embolism. Normal heart size. No pericardial effusion. Ectatic but nonaneurysmal ascending thoracic aorta. Atherosclerotic calcifications present along the thoracic aorta and coronary arteries. Mediastinum/Nodes: No  enlarged mediastinal, hilar, or axillary lymph nodes. Thyroid gland, trachea, and esophagus demonstrate no significant findings. Lungs/Pleura: Advanced combined centrilobular and paraseptal pulmonary emphysema. Patchy airspace opacity present along the dependent portion of the left lower lobe. Diffuse mild bronchial wall thickening. No suspicious pulmonary mass or nodule. Biapical pleuroparenchymal scarring. Upper Abdomen: No acute abnormality. Multiple small radiopaque stones layer within the gallbladder lumen. No findings to suggest acute cholecystitis. Peripherally calcified low-attenuation lesion within the interpolar left kidney measures up to 2.6 cm. This lesion is incompletely characterized on the present examination. Musculoskeletal: No chest wall abnormality. No acute or significant osseous findings. Probable remote L2 compression fracture. Review of the MIP images confirms the above findings. IMPRESSION: 1. Negative for acute pulmonary embolus. 2. Patchy airspace opacity in the dependent aspect of the left lower lobe may reflect early bronchopneumonia or small volume aspiration. 3. Aortic and coronary artery atherosclerotic vascular calcifications. 4. Advanced combined paraseptal and centrilobular pulmonary emphysema. 5. Cholelithiasis. 6. Likely remote L2 compression fracture. 7. Incompletely evaluated but likely benign peripherally calcified cystic lesion in the left kidney. Aortic Atherosclerosis (ICD10-I70.0) and Emphysema (ICD10-J43.9). Electronically Signed   By: Malachy Moan M.D.   On: 12/28/2020 16:31   US Venous Img Lower Bilateral (DVT)  Result Date: 12/29/2020 CLINICAL DATA:  Elevated liver function tests. EXAM: BILATERAL LOWER EXTREMITY VENOUS DOPPLER ULTRASOUND TECHNIQUE: Gray-scale sonography with graded compression, as well as color Doppler and duplex ultrasound were performed to evaluate the lower extremity deep venous systems from the level of the common femoral vein and including  the common femoral, femoral, profunda femoral, popliteal and calf veins including the posterior tibial, peroneal and gastrocnemius veins when visible. The superficial great saphenous vein was also interrogated. Spectral Doppler was utilized to evaluate flow at rest and with distal augmentation maneuvers in the common femoral, femoral and popliteal veins. COMPARISON:  None. FINDINGS: RIGHT LOWER EXTREMITY Common Femoral Vein: No evidence of thrombus. Normal compressibility, respiratory phasicity and response to augmentation. Saphenofemoral Junction: No evidence of thrombus. Normal compressibility and flow on color Doppler imaging. Profunda Femoral Vein: No evidence of thrombus. Normal compressibility and flow on color Doppler imaging. Femoral Vein: No evidence of thrombus. Normal compressibility, respiratory phasicity and response to augmentation. Popliteal Vein: No evidence of thrombus. Normal compressibility, respiratory phasicity and response to augmentation. Calf Veins: No evidence of thrombus. Normal compressibility and flow on color Doppler imaging. Superficial Great Saphenous Vein: No evidence of thrombus.  Normal compressibility. Venous Reflux:  None. Other Findings:  None. LEFT LOWER EXTREMITY Common Femoral Vein: No evidence of thrombus. Normal compressibility, respiratory phasicity and response to augmentation. Saphenofemoral Junction: No evidence of thrombus. Normal compressibility and flow on color Doppler imaging. Profunda Femoral Vein: No evidence of thrombus. Normal compressibility and flow on color Doppler imaging. Femoral Vein: No evidence of thrombus. Normal compressibility, respiratory phasicity and response to augmentation. Popliteal Vein: No evidence of thrombus. Normal compressibility, respiratory phasicity and response to augmentation. Calf Veins: No evidence of thrombus. Normal compressibility and flow on color Doppler imaging. Superficial Great Saphenous Vein: No evidence of thrombus. Normal  compressibility. Venous Reflux:  None. Other Findings:  None. IMPRESSION: No evidence of deep venous thrombosis in either lower extremity. Electronically Signed   By: Duanne Guess D.O.   On: 12/29/2020 13:58        Scheduled Meds:  albuterol  2 puff Inhalation Q6H   amitriptyline  300 mg Oral QHS   azithromycin  500 mg Oral Q24H   dronabinol  2.5 mg Oral BID AC   enoxaparin (LOVENOX) injection  40 mg Subcutaneous Q24H   folic acid  1 mg Oral Daily   LORazepam  0-4 mg Intravenous Q6H   Followed by   LORazepam  0-4 mg Intravenous Q12H   multivitamin with minerals  1 tablet Oral Daily   nicotine  21 mg Transdermal Daily   thiamine  100 mg Oral Daily   Or   thiamine  100 mg Intravenous Daily   Continuous Infusions:  cefTRIAXone (ROCEPHIN)  IV 2 g (12/29/20 1903)   remdesivir 100 mg in NS 100 mL Stopped (12/29/20 0933)     LOS: 1 day    Time spent: 32 minutes    Marrion Coy, MD [facility]   To contact the attending provider between 7A-7P or the covering provider during after hours 7P-7A, please log into the web site [email or web address] and access using universal [place] password for that web site. If you do not have the password, please call the hospital operator.  12/30/2020, 9:46 AM

## 2020-12-30 NOTE — Progress Notes (Signed)
PHARMACIST - PHYSICIAN COMMUNICATION DR:   Chipper Herb CONCERNING: Antibiotic IV to Oral Route Change Policy  RECOMMENDATION: This patient is receiving azithromycin by the intravenous route.  Based on criteria approved by the Pharmacy and Therapeutics Committee, the antibiotic(s) is/are being converted to the equivalent oral dose form(s).   DESCRIPTION: These criteria include: Patient being treated for a respiratory tract infection, urinary tract infection, cellulitis or clostridium difficile associated diarrhea if on metronidazole The patient is not neutropenic and does not exhibit a GI malabsorption state The patient is eating (either orally or via tube) and/or has been taking other orally administered medications for a least 24 hours The patient is improving clinically and has a Tmax < 100.5  If you have questions about this conversion, please contact the Pharmacy Department   Albina Billet, PharmD, BCPS Clinical Pharmacist 12/30/2020 7:51 AM

## 2020-12-31 DIAGNOSIS — R627 Adult failure to thrive: Secondary | ICD-10-CM | POA: Diagnosis not present

## 2020-12-31 DIAGNOSIS — F1010 Alcohol abuse, uncomplicated: Secondary | ICD-10-CM | POA: Diagnosis not present

## 2020-12-31 DIAGNOSIS — U071 COVID-19: Secondary | ICD-10-CM | POA: Diagnosis not present

## 2020-12-31 DIAGNOSIS — E43 Unspecified severe protein-calorie malnutrition: Secondary | ICD-10-CM

## 2020-12-31 LAB — COMPREHENSIVE METABOLIC PANEL
ALT: 15 U/L (ref 0–44)
AST: 23 U/L (ref 15–41)
Albumin: 2.7 g/dL — ABNORMAL LOW (ref 3.5–5.0)
Alkaline Phosphatase: 114 U/L (ref 38–126)
Anion gap: 10 (ref 5–15)
BUN: 24 mg/dL — ABNORMAL HIGH (ref 8–23)
CO2: 24 mmol/L (ref 22–32)
Calcium: 8.7 mg/dL — ABNORMAL LOW (ref 8.9–10.3)
Chloride: 100 mmol/L (ref 98–111)
Creatinine, Ser: 0.89 mg/dL (ref 0.61–1.24)
GFR, Estimated: >60 mL/min (ref >60)
Glucose, Bld: 81 mg/dL (ref 70–99)
Potassium: 3.7 mmol/L (ref 3.5–5.1)
Sodium: 134 mmol/L — ABNORMAL LOW (ref 135–145)
Total Bilirubin: 0.7 mg/dL (ref 0.3–1.2)
Total Protein: 6.4 g/dL — ABNORMAL LOW (ref 6.5–8.1)

## 2020-12-31 LAB — CBC WITH DIFFERENTIAL/PLATELET
Abs Immature Granulocytes: 0.09 10*3/uL — ABNORMAL HIGH (ref 0.00–0.07)
Basophils Absolute: 0.1 10*3/uL (ref 0.0–0.1)
Basophils Relative: 1 %
Eosinophils Absolute: 0.2 10*3/uL (ref 0.0–0.5)
Eosinophils Relative: 2 %
HCT: 40.2 % (ref 39.0–52.0)
Hemoglobin: 13.5 g/dL (ref 13.0–17.0)
Immature Granulocytes: 1 %
Lymphocytes Relative: 14 %
Lymphs Abs: 1.6 10*3/uL (ref 0.7–4.0)
MCH: 30 pg (ref 26.0–34.0)
MCHC: 33.6 g/dL (ref 30.0–36.0)
MCV: 89.3 fL (ref 80.0–100.0)
Monocytes Absolute: 1.1 10*3/uL — ABNORMAL HIGH (ref 0.1–1.0)
Monocytes Relative: 10 %
Neutro Abs: 8 10*3/uL — ABNORMAL HIGH (ref 1.7–7.7)
Neutrophils Relative %: 72 %
Platelets: 427 10*3/uL — ABNORMAL HIGH (ref 150–400)
RBC: 4.5 MIL/uL (ref 4.22–5.81)
RDW: 14.4 % (ref 11.5–15.5)
WBC: 11 10*3/uL — ABNORMAL HIGH (ref 4.0–10.5)
nRBC: 0 % (ref 0.0–0.2)

## 2020-12-31 LAB — FERRITIN: Ferritin: 608 ng/mL — ABNORMAL HIGH (ref 24–336)

## 2020-12-31 LAB — MAGNESIUM: Magnesium: 2 mg/dL (ref 1.7–2.4)

## 2020-12-31 LAB — C-REACTIVE PROTEIN: CRP: 2.8 mg/dL — ABNORMAL HIGH (ref <1.0)

## 2020-12-31 LAB — D-DIMER, QUANTITATIVE: D-Dimer, Quant: 1.26 ug/mL-FEU — ABNORMAL HIGH (ref 0.00–0.50)

## 2020-12-31 LAB — PHOSPHORUS: Phosphorus: 2.9 mg/dL (ref 2.5–4.6)

## 2020-12-31 MED ORDER — SENNOSIDES-DOCUSATE SODIUM 8.6-50 MG PO TABS
2.0000 | ORAL_TABLET | Freq: Two times a day (BID) | ORAL | Status: DC
  Administered 2020-12-31 – 2021-01-08 (×14): 2 via ORAL
  Filled 2020-12-31 (×13): qty 2

## 2020-12-31 NOTE — Progress Notes (Signed)
PROGRESS NOTE    Charles Ray  XIP:[id number] DOB: 1950-12-26 DOA: 12/28/2020 PCP: Jerl Mina, MD   Chief complaint.  Weakness Brief Narrative:  70 year old man with history of alcohol abuse tobacco abuse previous history of fracture back in 06/2020.  He has been having some issues with memory and weight loss.  Brought in the hospital with weakness and cough and found to have pneumonia and COVID-19 infection.  Admitted 12/28/2020. Patient is treated with remdesivir.  Not developed hypoxemia.  Patient was also has significant weakness, seen by PT, recommended nursing placement.   Assessment & Plan:   Active Problems:   Hepatitis C   Tobacco abuse   Alcohol abuse   Pneumonia due to COVID-19 virus   Weakness   Failure to thrive in adult   Severe protein-calorie malnutrition (HCC)  #1.  Left lower lobe pneumonia, most likely due to aspiration. COVID-19 pneumonia. Patient condition improving, he is not on oxygen.  He has a cough, not short of breath. Will continue finish 5 days of oral antibiotics. Will finish The course of remdesivir. No indication for steroids. Speech therapy evaluation is pending.  #2.  Failure to thrive. Anorexia. Severe protein calorie malnutrition. Generalized weakness Patient still has poor appetite, was able to eat 10 to 20% of her meals.  We will continue protein supplements as well as Marinol. Continue PT/OT.  3.  Alcohol abuse. 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 B12, TSH, RPR all normal.  #4.  Reactive thrombocytosis. Condition stable.  5.  Left leg pain. No DVT.  Continue to follow   DVT prophylaxis: Lovenox Code Status: Full Family Communication:  Disposition Plan:      Status is: Inpatient   Remains inpatient appropriate because:Unsafe d/c plan and Inpatient level of care appropriate due to severity of illness   Dispo: The patient is from: Home              Anticipated d/c is to: SNF              Patient  currently is not medically stable to d/c.              Difficult to place patient No            I/O last 3 completed shifts: In: -  Out: 650 [Urine:650] No intake/output data recorded.     Consultants:  None  Procedures: None  Antimicrobials: Augmentin  Subjective: Patient still has significant weakness, poor appetite, no nausea vomiting or diarrhea.  No abdominal pain.  No recorded bowel movement since admission, senna added. Denies any short of breath or cough, no hypoxia. No headache or dizziness. No fever or chills.  Objective: Vitals:   12/30/20 1916 12/31/20 0057 12/31/20 0500 12/31/20 0836  BP: (!) 156/83 (!) 161/75 (!) 154/81 (!) 141/85  Pulse: 93 85 94 78  Resp: 18 18 16  (!) 23  Temp: 98 F (36.7 C) 98.6 F (37 C) 98.2 F (36.8 C) 97.6 F (36.4 C)  TempSrc:    Oral  SpO2: 98% 98% 100% 100%  Weight:      Height:        Intake/Output Summary (Last 24 hours) at 12/31/2020 01/02/2021 Last data filed at 12/30/2020 2100 Gross per 24 hour  Intake --  Output 650 ml  Net -650 ml   Filed Weights   12/28/20 1042  Weight: 70.3 kg    Examination:  General exam: Appears calm and comfortable, appears severely malnourished. Respiratory system: Decreased breathing  sounds. Respiratory effort normal. Cardiovascular system: S1 & S2 heard, RRR. No JVD, murmurs, rubs, gallops or clicks. No pedal edema. Gastrointestinal system: Abdomen is nondistended, soft and nontender. No organomegaly or masses felt. Normal bowel sounds heard. Central nervous system: Alert and oriented x2. No focal neurological deficits. Extremities: Bilateral leg weakness.  Significant muscle atrophy. Skin: No rashes, lesions or ulcers Psychiatry: Mood & affect appropriate.     Data Reviewed: I have personally reviewed following labs and imaging studies  CBC: Recent Labs  Lab 12/28/20 1052 12/29/20 0740 12/30/20 0433 12/31/20 0557  WBC 19.1* 11.1* 10.3 11.0*  NEUTROABS  --  7.8* 7.3  8.0*  HGB 14.6 13.5 12.5* 13.5  HCT 44.0 40.4 38.2* 40.2  MCV 92.6 91.0 90.3 89.3  PLT 513* 413* 472* 427*   Basic Metabolic Panel: Recent Labs  Lab 12/28/20 1052 12/29/20 0740 12/30/20 0433 12/31/20 0557  NA 137 139 138 134*  K 4.2 3.7 3.9 3.7  CL 103 105 105 100  CO2 22 25 26 24   GLUCOSE 105* 95 71 81  BUN 26* 28* 24* 24*  CREATININE 1.09 0.93 0.86 0.89  CALCIUM 9.1 8.5* 8.5* 8.7*  MG  --  2.0 1.9 2.0  PHOS  --  3.5 3.1 2.9   GFR: Estimated Creatinine Clearance: 74.6 mL/min (by C-G formula based on SCr of 0.89 mg/dL). Liver Function Tests: Recent Labs  Lab 12/28/20 1052 12/29/20 0740 12/30/20 0433 12/31/20 0557  AST 15 18 13* 23  ALT 11 11 11 15   ALKPHOS 148* 128* 116 114  BILITOT 0.7 0.4 0.7 0.7  PROT 7.3 6.8 6.3* 6.4*  ALBUMIN 3.3* 3.0* 2.7* 2.7*   No results for input(s): LIPASE, AMYLASE in the last 168 hours. No results for input(s): AMMONIA in the last 168 hours. Coagulation Profile: No results for input(s): INR, PROTIME in the last 168 hours. Cardiac Enzymes: No results for input(s): CKTOTAL, CKMB, CKMBINDEX, TROPONINI in the last 168 hours. BNP (last 3 results) No results for input(s): PROBNP in the last 8760 hours. HbA1C: No results for input(s): HGBA1C in the last 72 hours. CBG: No results for input(s): GLUCAP in the last 168 hours. Lipid Profile: No results for input(s): CHOL, HDL, LDLCALC, TRIG, CHOLHDL, LDLDIRECT in the last 72 hours. Thyroid Function Tests: Recent Labs    12/30/20 0433  TSH 2.301   Anemia Panel: Recent Labs    12/30/20 0433 12/31/20 0557  VITAMINB12 872  --   FERRITIN 417* 608*   Sepsis Labs: Recent Labs  Lab 12/28/20 1259 12/28/20 1344 12/28/20 1736 12/29/20 0740 12/30/20 0433  PROCALCITON 0.11  --   --  <0.10 <0.10  LATICACIDVEN  --  2.9* 1.3  --   --     Recent Results (from the past 240 hour(s))  Blood culture (single)     Status: None (Preliminary result)   Collection Time: 12/28/20  1:44 PM    Specimen: BLOOD  Result Value Ref Range Status   Specimen Description BLOOD BLOOD LEFT FOREARM  Final   Special Requests   Final    BOTTLES DRAWN AEROBIC AND ANAEROBIC Blood Culture adequate volume   Culture   Final    NO GROWTH 3 DAYS Performed at [facility], [address] [place], [address] [place]    Report Status PENDING  Incomplete  Resp Panel by RT-PCR (Flu A&B, Covid) Nasopharyngeal Swab     Status: Abnormal   Collection Time: 12/28/20  1:44 PM   Specimen: Nasopharyngeal Swab; Nasopharyngeal(NP)  swabs in vial transport medium  Result Value Ref Range Status   SARS Coronavirus 2 by RT PCR POSITIVE (A) NEGATIVE Final    Comment: RESULT CALLED TO, READ BACK BY AND VERIFIED WITH: MARY FOUNKE AT 1714 ON 12/28/20 BY SS (NOTE) SARS-CoV-2 target nucleic acids are DETECTED.  The SARS-CoV-2 RNA is generally detectable in upper respiratory specimens during the acute phase of infection. Positive results are indicative of the presence of the identified virus, but do not rule out bacterial infection or co-infection with other pathogens not detected by the test. Clinical correlation with patient history and other diagnostic information is necessary to determine patient infection status. The expected result is Negative.  Fact Sheet for Patients: BloggerCourse.com  Fact Sheet for Healthcare Providers: SeriousBroker.it  This test is not yet approved or cleared by the [place] FDA and  has been authorized for detection and/or diagnosis of SARS-CoV-2 by FDA under an Emergency Use Authorization (EUA).  This EUA will remain in effect (meaning this test can  be used) for the duration of  the COVID-19 declaration under Section 564(b)(1) of the Act, 21 U.S.C. section 360bbb-3(b)(1), unless the authorization is terminated or revoked sooner.     Influenza A by PCR NEGATIVE NEGATIVE Final   Influenza B by PCR NEGATIVE NEGATIVE  Final    Comment: (NOTE) The Xpert Xpress SARS-CoV-2/FLU/RSV plus assay is intended as an aid in the diagnosis of influenza from Nasopharyngeal swab specimens and should not be used as a sole basis for treatment. Nasal washings and aspirates are unacceptable for Xpert Xpress SARS-CoV-2/FLU/RSV testing.  Fact Sheet for Patients: BloggerCourse.com  Fact Sheet for Healthcare Providers: SeriousBroker.it  This test is not yet approved or cleared by the [place] FDA and has been authorized for detection and/or diagnosis of SARS-CoV-2 by FDA under an Emergency Use Authorization (EUA). This EUA will remain in effect (meaning this test can be used) for the duration of the COVID-19 declaration under Section 564(b)(1) of the Act, 21 U.S.C. section 360bbb-3(b)(1), unless the authorization is terminated or revoked.  Performed at [facility], [address] [place], [place] [address]   Culture, blood (single)     Status: None (Preliminary result)   Collection Time: 12/28/20  5:36 PM   Specimen: BLOOD  Result Value Ref Range Status   Specimen Description BLOOD RIGHT ANTECUBITAL  Final   Special Requests   Final    BOTTLES DRAWN AEROBIC AND ANAEROBIC Blood Culture results may not be optimal due to an excessive volume of blood received in culture bottles   Culture   Final    NO GROWTH 3 DAYS Performed at [facility], [address], [place], [place] [address]    Report Status PENDING  Incomplete         Radiology Studies: US Venous Img Lower Bilateral (DVT)  Result Date: 12/29/2020 CLINICAL DATA:  Elevated liver function tests. EXAM: BILATERAL LOWER EXTREMITY VENOUS DOPPLER ULTRASOUND TECHNIQUE: Gray-scale sonography with graded compression, as well as color Doppler and duplex ultrasound were performed to evaluate the lower extremity deep venous systems from the level of the common femoral vein and including the  common femoral, femoral, profunda femoral, popliteal and calf veins including the posterior tibial, peroneal and gastrocnemius veins when visible. The superficial great saphenous vein was also interrogated. Spectral Doppler was utilized to evaluate flow at rest and with distal augmentation maneuvers in the common femoral, femoral and popliteal veins. COMPARISON:  None. FINDINGS: RIGHT LOWER EXTREMITY Common Femoral Vein:  No evidence of thrombus. Normal compressibility, respiratory phasicity and response to augmentation. Saphenofemoral Junction: No evidence of thrombus. Normal compressibility and flow on color Doppler imaging. Profunda Femoral Vein: No evidence of thrombus. Normal compressibility and flow on color Doppler imaging. Femoral Vein: No evidence of thrombus. Normal compressibility, respiratory phasicity and response to augmentation. Popliteal Vein: No evidence of thrombus. Normal compressibility, respiratory phasicity and response to augmentation. Calf Veins: No evidence of thrombus. Normal compressibility and flow on color Doppler imaging. Superficial Great Saphenous Vein: No evidence of thrombus. Normal compressibility. Venous Reflux:  None. Other Findings:  None. LEFT LOWER EXTREMITY Common Femoral Vein: No evidence of thrombus. Normal compressibility, respiratory phasicity and response to augmentation. Saphenofemoral Junction: No evidence of thrombus. Normal compressibility and flow on color Doppler imaging. Profunda Femoral Vein: No evidence of thrombus. Normal compressibility and flow on color Doppler imaging. Femoral Vein: No evidence of thrombus. Normal compressibility, respiratory phasicity and response to augmentation. Popliteal Vein: No evidence of thrombus. Normal compressibility, respiratory phasicity and response to augmentation. Calf Veins: No evidence of thrombus. Normal compressibility and flow on color Doppler imaging. Superficial Great Saphenous Vein: No evidence of thrombus. Normal  compressibility. Venous Reflux:  None. Other Findings:  None. IMPRESSION: No evidence of deep venous thrombosis in either lower extremity. Electronically Signed   By: Duanne Guess D.O.   On: 12/29/2020 13:58        Scheduled Meds:  albuterol  2 puff Inhalation Q6H   amitriptyline  300 mg Oral QHS   amoxicillin-clavulanate  1 tablet Oral BID   dronabinol  2.5 mg Oral BID AC   enoxaparin (LOVENOX) injection  40 mg Subcutaneous Q24H   feeding supplement  237 mL Oral TID BM   folic acid  1 mg Oral Daily   LORazepam  0-4 mg Intravenous Q12H   multivitamin with minerals  1 tablet Oral Daily   nicotine  21 mg Transdermal Daily   thiamine  100 mg Oral Daily   Or   thiamine  100 mg Intravenous Daily   Continuous Infusions:  remdesivir 100 mg in NS 100 mL 100 mg (12/31/20 0944)     LOS: 2 days    Time spent: 28 minutes    Marrion Coy, MD [facility]   To contact the attending provider between 7A-7P or the covering provider during after hours 7P-7A, please log into the web site [email or web address] and access using universal [place] password for that web site. If you do not have the password, please call the hospital operator.  12/31/2020, 9:52 AM

## 2020-12-31 NOTE — Evaluation (Signed)
Clinical/Bedside Swallow Evaluation Patient Details  Name: Charles Ray MRN: [id number] Date of Birth: 04-19-1950  Today's Date: 12/31/2020 Time: SLP Start Time (ACUTE ONLY): 0930 SLP Stop Time (ACUTE ONLY): 1020 SLP Time Calculation (min) (ACUTE ONLY): 50 min  Past Medical History: History reviewed. No pertinent past medical history. 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 male with medical history significant of previous hip fracture in 06/2020, alcohol use, tobacco use, patient is currently living with his son in a motel.  He was previously living with his wife until a week ago.  Review of records indicate that patient has been having some issues with his memory as well as having significant weight loss.  He reports that for the past week he has had decreased p.o. intake and lack of appetite.  He has had a cough which has been nonproductive.  Denies any fever or shortness of breath.  Denies any dysuria.  No nausea or vomiting.  He has been increasingly weak.  Records indicate that family has had difficulty getting him up.  He does have some chronic issues with hip pain from his surgery earlier this year.  It has been even harder to get him up and moving now."    Assessment / Plan / Recommendation  Clinical Impression  Pt presents with increased risk for aspiration and had notable wet congested cough throughout assessment today. Pt tested COVID positive and has pneumonia with concerns for aspiration. Pt was somewhat lethargic this am and needed cues to keep his eyes open. No immediate s/s of aspiration with any tested consistency but Pt had a wet cough before and after Po's therefore ot was difficult to determine swallowing abilities.Cough  was productive several times during the assessment. Pt has dentures that are loose and came out with any coughing. Rec Dys 2 chopped with nectar thick liquids to decrease risk of aspiration. Pt has denture cream but may not need dentures for this softer diet. ST to follow up with toleration of diet and provide trials of upgraded liquids and solids as overall status improves. SLP Visit Diagnosis: Dysphagia, oropharyngeal phase (R13.12)    Aspiration Risk  Moderate aspiration risk    Diet Recommendation Dysphagia 2 (Fine chop);Nectar-thick liquid   Medication Administration: Crushed with puree Supervision: Full supervision/cueing for compensatory strategies Compensations: Slow rate;Other (Comment) (Must be fully alert) Postural Changes: Seated upright at 90 degrees;Remain upright for at least 30 minutes after po intake    Other  Recommendations      Recommendations for follow up therapy are one component of a multi-disciplinary discharge planning process, led by the attending physician.  Recommendations may be updated based on patient status, additional functional criteria and insurance authorization.  Follow up Recommendations Skilled Nursing facility      Frequency and Duration min 2x/week          Prognosis Prognosis for Safe Diet Advancement: Fair      Swallow Study   General Date of Onset: 12/28/20 HPI: Per admitting H&P "Charles Ray is a 70 y.o. male with medical history significant of previous hip fracture in 06/2020, alcohol use, tobacco use, patient is currently living with his son in a motel.  He was previously living with his wife  until a week ago.  Review of records indicate that patient has been having some issues with his memory as well as having significant weight loss.  He reports that for the past week he has had decreased p.o. intake and lack of appetite.  He has had a cough which has been nonproductive.  Denies any fever or shortness of breath.  Denies any dysuria.   No nausea or vomiting.  He has been increasingly weak.  Records indicate that family has had difficulty getting him up.  He does have some chronic issues with hip pain from his surgery earlier this year.  It has been even harder to get him up and moving now." Type of Study: Bedside Swallow Evaluation Diet Prior to this Study: Regular History of Recent Intubation: No Behavior/Cognition: Cooperative;Confused Oral Cavity - Dentition: Dentures, top;Dentures, bottom;Other (Comment) (Don't fit well. The came out when he coughed. Has denture adhesive in room.) Self-Feeding Abilities: Total assist Patient Positioning: Upright in bed Baseline Vocal Quality: Hoarse;Low vocal intensity Volitional Cough: Congested;Wet    Oral/Motor/Sensory Function Overall Oral Motor/Sensory Function: Within functional limits   Ice Chips Ice chips: Within functional limits   Thin Liquid Thin Liquid: Impaired Presentation: Cup;Spoon;Straw Pharyngeal  Phase Impairments: Suspected delayed Swallow;Cough - Delayed    Nectar Thick Nectar Thick Liquid: Within functional limits Presentation: Cup   Honey Thick     Puree Puree: Within functional limits   Solid     Solid: Impaired Oral Phase Impairments: Impaired mastication Oral Phase Functional Implications: Prolonged oral transit Pharyngeal Phase Impairments: Other (comments) (Pt with continuous wet cough during assessment)      Eather Colas 12/31/2020,1:03 PM

## 2020-12-31 NOTE — Progress Notes (Signed)
ST eval this am. Report to follow. Pt somewhat lethargic, productive cough noted this am as well. Pt with moderate risk of aspiration. Will alter diet to Dys 2 with nectar thick liquids. Son and Nsg advised to hold tray when not alert. Precautions posted. Meds crushed in applesauce or with magic cup. ST to follow and alter diet as needed after further assessment as alertness improves. Hs dentures in room, uses denture adhesive cream.

## 2021-01-01 DIAGNOSIS — E43 Unspecified severe protein-calorie malnutrition: Secondary | ICD-10-CM | POA: Diagnosis not present

## 2021-01-01 DIAGNOSIS — R627 Adult failure to thrive: Secondary | ICD-10-CM | POA: Diagnosis not present

## 2021-01-01 DIAGNOSIS — F1010 Alcohol abuse, uncomplicated: Secondary | ICD-10-CM | POA: Diagnosis not present

## 2021-01-01 DIAGNOSIS — U071 COVID-19: Secondary | ICD-10-CM | POA: Diagnosis not present

## 2021-01-01 MED ORDER — PANCRELIPASE (LIP-PROT-AMYL) 12000-38000 UNITS PO CPEP
12000.0000 [IU] | ORAL_CAPSULE | Freq: Three times a day (TID) | ORAL | Status: DC
  Administered 2021-01-01 – 2021-01-08 (×22): 12000 [IU] via ORAL
  Filled 2021-01-01 (×23): qty 1

## 2021-01-01 NOTE — Progress Notes (Signed)
Patient found with bottle of Mountain Dew at bedside, educated regarding thickened liquid diet to prevent aspiration. Given thickened water instead.

## 2021-01-01 NOTE — Progress Notes (Signed)
Occupational Therapy Treatment Patient Details Name: Charles Ray MRN: [id number] DOB: 22-May-1950 Today's Date: 01/01/2021   History of present illness 70 y.o. male with medical history significant of previous hip fracture in 06/2020 (s/p L THA anterior approach 06/19/20), alcohol use, tobacco use, patient is currently living with his son in a motel.  He was previously living with his wife until a week ago.  Review of records indicate that patient has been having some issues with his memory as well as having significant weight loss.  He reports that for the past week he has had decreased p.o. intake and lack of appetite.  He has had a cough which has been nonproductive.  Denies any fever or shortness of breath.  Denies any dysuria.  No nausea or vomiting.  He has been increasingly weak.  Records indicate that family has had difficulty getting him up.  He does have some chronic issues with hip pain from his surgery earlier this year.  It has been even harder to get him up and moving now.   OT comments  Upon entering the room, pt supine in bed and sleeping soundly. Multiple attempts made to wake pt from deep sleep. Pt mumbles responses to therapist but does not open eyes. Wash cloth placed in pt's hand and guided to face to initiate face washing and pt placed upright in chair position. Pt continuing to fall asleep and unable to remain alert for safe participation in OT intervention. Pt repositioned in bed with total A and pt still does not wake. All needs within reach and RN notified of lethargy. Call bell within reach and bed alarm activated.    Recommendations for follow up therapy are one component of a multi-disciplinary discharge planning process, led by the attending physician.  Recommendations may be updated based on patient status, additional functional criteria and insurance authorization.    Follow Up Recommendations  SNF;Supervision/Assistance - 24 hour    Equipment Recommendations  Other  (comment) (defer to next venue of care)       Precautions / Restrictions Precautions Precautions: Fall Restrictions Weight Bearing Restrictions: No              ADL either performed or assessed with clinical judgement   ADL Overall ADL's : Needs assistance/impaired     Grooming: Wash/dry hands;Wash/dry face;Minimal assistance                                       Vision Patient Visual Report: No change from baseline            Cognition Arousal/Alertness: Awake/alert Behavior During Therapy: WFL for tasks assessed/performed Overall Cognitive Status: Difficult to assess                                 General Comments: Pt very lethargic this session                   Pertinent Vitals/ Pain       Pain Assessment: No/denies pain         Frequency  Min 2X/week        Progress Toward Goals  OT Goals(current goals can now be found in the care plan section)  Progress towards OT goals: Progressing toward goals  Acute Rehab OT Goals Patient Stated Goal: to get better OT Goal Formulation:  With patient Time For Goal Achievement: 01/12/21 Potential to Achieve Goals: Good  Plan Discharge plan remains appropriate       AM-PAC OT "6 Clicks" Daily Activity     Outcome Measure   Help from another person eating meals?: None Help from another person taking care of personal grooming?: A Little Help from another person toileting, which includes using toliet, bedpan, or urinal?: A Lot Help from another person bathing (including washing, rinsing, drying)?: A Lot Help from another person to put on and taking off regular upper body clothing?: A Lot Help from another person to put on and taking off regular lower body clothing?: A Lot 6 Click Score: 15    End of Session    OT Visit Diagnosis: Unsteadiness on feet (R26.81);Muscle weakness (generalized) (M62.81);Repeated falls (R29.6);History of falling (Z91.81)   Activity  Tolerance Patient limited by fatigue;Patient limited by lethargy   Patient Left in bed;with call bell/phone within reach;with bed alarm set   Nurse Communication Mobility status;Other (comment) (lethargy)        Time: 8469-6295 OT Time Calculation (min): 16 min  Charges: OT General Charges $OT Visit: 1 Visit OT Treatments $Self Care/Home Management : 8-22 mins  Jackquline Denmark, MS, OTR/L , CBIS ascom [phone]  01/01/21, 3:24 PM

## 2021-01-01 NOTE — Progress Notes (Signed)
PROGRESS NOTE    Charles Ray  BJS:[id number] DOB: [birth year] DOA: 12/28/2020 PCP: Jerl Mina, MD   Chief complaint.  Weakness. Brief Narrative:  70 year old man with history of alcohol abuse tobacco abuse previous history of fracture back in 06/2020.  He has been having some issues with memory and weight loss.  Brought in the hospital with weakness and cough and found to have pneumonia and COVID-19 infection.  Admitted 12/28/2020. Patient is treated with remdesivir.  Not developed hypoxemia.  Patient was also has significant weakness, seen by PT, recommended nursing placement. Patient is also evaluated by speech therapy, deemed to be high risk with aspiration.  Placed on nectar thick liquid.  Also treated with Augmentin for aspiration pneumonia.  Assessment & Plan:   Active Problems:   Hepatitis C   Tobacco abuse   Alcohol abuse   Pneumonia due to COVID-19 virus   Weakness   Failure to thrive in adult   Severe protein-calorie malnutrition (HCC)  #1.  Left lower lobe aspiration pneumonia. COVID-19 pneumonia. Continue to finish the course of remdesivir, continue finish 5 days of oral Augmentin. Patient has been evaluated by speech therapy, high risk for aspiration.  #2.  Failure to thrive. Anorexia. Severe protein calorie malnutrition. Generalized weakness Patient continues to have poor appetite, I will continue Marinol.  Also added Creon as patient has chronic has some loose stools concerning for pancreatic insufficiency. Continue PT/OT.  3.  Alcoholic dementia with alcohol abuse. Patient does not have any evidence of alcohol withdrawal.  4.  Reactive thrombocytosis. Condition stable.     DVT prophylaxis: Lovenox Code Status: Full Family Communication:  Disposition Plan:      Status is: Inpatient   Remains inpatient appropriate because:Unsafe d/c plan and Inpatient level of care appropriate due to severity of illness   Dispo: The patient is from: Home               Anticipated d/c is to: SNF              Patient currently is not medically stable to d/c.              Difficult to place patient No          I/O last 3 completed shifts: In: -  Out: 450 [Urine:450] No intake/output data recorded.     Consultants:  None  Procedures: None  Antimicrobials: Augmentin  Subjective: Patient still drowsy this morning, appetite still poor.  Spoke with the nurse, will need better documentation of p.o. intake. Abdominal pain or nausea vomiting. No dysuria hematuria  No fever or chills.   Objective: Vitals:   12/31/20 2110 12/31/20 2349 01/01/21 0611 01/01/21 0809  BP: (!) 147/80 104/69 123/68 122/75  Pulse: 88 80 71 72  Resp: 16 16 16 16   Temp: 98.5 F (36.9 C) 98 F (36.7 C) 98.5 F (36.9 C) (!) 97.5 F (36.4 C)  TempSrc:    Oral  SpO2: 99% 96% 96% 98%  Weight:      Height:       No intake or output data in the 24 hours ending 01/01/21 1105 Filed Weights   12/28/20 1042  Weight: 70.3 kg    Examination:  General exam: Appears calm and comfortable, severely malnourished. Respiratory system: Decreased breathing sounds. Respiratory effort normal. Cardiovascular system: S1 & S2 heard, RRR. No JVD, murmurs, rubs, gallops or clicks. No pedal edema. Gastrointestinal system: Abdomen is nondistended, soft and nontender. No organomegaly or masses felt. Normal  bowel sounds heard. Central nervous system: Drowsy and oriented x2. No focal neurological deficits. Extremities: Muscle atrophy. Skin: No rashes, lesions or ulcers Psychiatry: Mood & affect appropriate.     Data Reviewed: I have personally reviewed following labs and imaging studies  CBC: Recent Labs  Lab 12/28/20 1052 12/29/20 0740 12/30/20 0433 12/31/20 0557  WBC 19.1* 11.1* 10.3 11.0*  NEUTROABS  --  7.8* 7.3 8.0*  HGB 14.6 13.5 12.5* 13.5  HCT 44.0 40.4 38.2* 40.2  MCV 92.6 91.0 90.3 89.3  PLT 513* 413* 472* 427*   Basic Metabolic Panel: Recent Labs  Lab  12/28/20 1052 12/29/20 0740 12/30/20 0433 12/31/20 0557  NA 137 139 138 134*  K 4.2 3.7 3.9 3.7  CL 103 105 105 100  CO2 22 25 26 24   GLUCOSE 105* 95 71 81  BUN 26* 28* 24* 24*  CREATININE 1.09 0.93 0.86 0.89  CALCIUM 9.1 8.5* 8.5* 8.7*  MG  --  2.0 1.9 2.0  PHOS  --  3.5 3.1 2.9   GFR: Estimated Creatinine Clearance: 74.6 mL/min (by C-G formula based on SCr of 0.89 mg/dL). Liver Function Tests: Recent Labs  Lab 12/28/20 1052 12/29/20 0740 12/30/20 0433 12/31/20 0557  AST 15 18 13* 23  ALT 11 11 11 15   ALKPHOS 148* 128* 116 114  BILITOT 0.7 0.4 0.7 0.7  PROT 7.3 6.8 6.3* 6.4*  ALBUMIN 3.3* 3.0* 2.7* 2.7*   No results for input(s): LIPASE, AMYLASE in the last 168 hours. No results for input(s): AMMONIA in the last 168 hours. Coagulation Profile: No results for input(s): INR, PROTIME in the last 168 hours. Cardiac Enzymes: No results for input(s): CKTOTAL, CKMB, CKMBINDEX, TROPONINI in the last 168 hours. BNP (last 3 results) No results for input(s): PROBNP in the last 8760 hours. HbA1C: No results for input(s): HGBA1C in the last 72 hours. CBG: No results for input(s): GLUCAP in the last 168 hours. Lipid Profile: No results for input(s): CHOL, HDL, LDLCALC, TRIG, CHOLHDL, LDLDIRECT in the last 72 hours. Thyroid Function Tests: Recent Labs    12/30/20 0433  TSH 2.301   Anemia Panel: Recent Labs    12/30/20 0433 12/31/20 0557  VITAMINB12 872  --   FERRITIN 417* 608*   Sepsis Labs: Recent Labs  Lab 12/28/20 1259 12/28/20 1344 12/28/20 1736 12/29/20 0740 12/30/20 0433  PROCALCITON 0.11  --   --  <0.10 <0.10  LATICACIDVEN  --  2.9* 1.3  --   --     Recent Results (from the past 240 hour(s))  Blood culture (single)     Status: None (Preliminary result)   Collection Time: 12/28/20  1:44 PM   Specimen: BLOOD  Result Value Ref Range Status   Specimen Description BLOOD BLOOD LEFT FOREARM  Final   Special Requests   Final    BOTTLES DRAWN AEROBIC AND  ANAEROBIC Blood Culture adequate volume   Culture   Final    NO GROWTH 4 DAYS Performed at [facility], [address], [place], [address] [place]    Report Status PENDING  Incomplete  Resp Panel by RT-PCR (Flu A&B, Covid) Nasopharyngeal Swab     Status: Abnormal   Collection Time: 12/28/20  1:44 PM   Specimen: Nasopharyngeal Swab; Nasopharyngeal(NP) swabs in vial transport medium  Result Value Ref Range Status   SARS Coronavirus 2 by RT PCR POSITIVE (A) NEGATIVE Final    Comment: RESULT CALLED TO, READ BACK BY AND VERIFIED WITH: [id number] AT 1714  ON 12/28/20 BY SS (NOTE) SARS-CoV-2 target nucleic acids are DETECTED.  The SARS-CoV-2 RNA is generally detectable in upper respiratory specimens during the acute phase of infection. Positive results are indicative of the presence of the identified virus, but do not rule out bacterial infection or co-infection with other pathogens not detected by the test. Clinical correlation with patient history and other diagnostic information is necessary to determine patient infection status. The expected result is Negative.  Fact Sheet for Patients: BloggerCourse.com  Fact Sheet for Healthcare Providers: SeriousBroker.it  This test is not yet approved or cleared by the [place] FDA and  has been authorized for detection and/or diagnosis of SARS-CoV-2 by FDA under an Emergency Use Authorization (EUA).  This EUA will remain in effect (meaning this test can  be used) for the duration of  the COVID-19 declaration under Section 564(b)(1) of the Act, 21 U.S.C. section 360bbb-3(b)(1), unless the authorization is terminated or revoked sooner.     Influenza A by PCR NEGATIVE NEGATIVE Final   Influenza B by PCR NEGATIVE NEGATIVE Final    Comment: (NOTE) The Xpert Xpress SARS-CoV-2/FLU/RSV plus assay is intended as an aid in the diagnosis of influenza from Nasopharyngeal swab specimens  and should not be used as a sole basis for treatment. Nasal washings and aspirates are unacceptable for Xpert Xpress SARS-CoV-2/FLU/RSV testing.  Fact Sheet for Patients: BloggerCourse.com  Fact Sheet for Healthcare Providers: SeriousBroker.it  This test is not yet approved or cleared by the [place] FDA and has been authorized for detection and/or diagnosis of SARS-CoV-2 by FDA under an Emergency Use Authorization (EUA). This EUA will remain in effect (meaning this test can be used) for the duration of the COVID-19 declaration under Section 564(b)(1) of the Act, 21 U.S.C. section 360bbb-3(b)(1), unless the authorization is terminated or revoked.  Performed at [facility], [address] [place], [place] [address]   Culture, blood (single)     Status: None (Preliminary result)   Collection Time: 12/28/20  5:36 PM   Specimen: BLOOD  Result Value Ref Range Status   Specimen Description BLOOD RIGHT ANTECUBITAL  Final   Special Requests   Final    BOTTLES DRAWN AEROBIC AND ANAEROBIC Blood Culture results may not be optimal due to an excessive volume of blood received in culture bottles   Culture   Final    NO GROWTH 4 DAYS Performed at [facility], [address] [place], [place] [address]    Report Status PENDING  Incomplete         Radiology Studies: No results found.      Scheduled Meds:  albuterol  2 puff Inhalation Q6H   amitriptyline  300 mg Oral QHS   amoxicillin-clavulanate  1 tablet Oral BID   dronabinol  2.5 mg Oral BID AC   enoxaparin (LOVENOX) injection  40 mg Subcutaneous Q24H   feeding supplement  237 mL Oral TID BM   folic acid  1 mg Oral Daily   lipase/protease/amylase  12,000 Units Oral TID WC   LORazepam  0-4 mg Intravenous Q12H   multivitamin with minerals  1 tablet Oral Daily   nicotine  21 mg Transdermal Daily   senna-docusate  2 tablet Oral BID   thiamine  100 mg  Oral Daily   Or   thiamine  100 mg Intravenous Daily   Continuous Infusions:   LOS: 3 days    Time spent: 28 minutes    Marrion Coy, MD [facility]  To contact the attending provider between 7A-7P or the covering provider during after hours 7P-7A, please log into the web site [email or web address] and access using universal [place] password for that web site. If you do not have the password, please call the hospital operator.  01/01/2021, 11:05 AM

## 2021-01-01 NOTE — TOC Progression Note (Signed)
Transition of Care [facility]) - Progression Note    Patient Details  Name: Charles Ray MRN: [id number] Date of Birth: 1950/05/04  [facility]) CM/SW Contact  Caryn Section, RN Phone Number: 01/01/2021, 10:21 AM  Clinical Narrative:   No bed offers at this time, will extend bed search  TOC to follow    Expected Discharge Plan: Skilled Nursing Facility Barriers to Discharge: Continued Medical Work up  Expected Discharge Plan and Services Expected Discharge Plan: Skilled Nursing Facility   Discharge Planning Services: CM Consult Post Acute Care Choice: Skilled Nursing Facility Living arrangements for the past 2 months: Single Family Home                 DME Arranged: N/A DME Agency: NA       HH Arranged: NA HH Agency: NA         Social Determinants of Health (SDOH) Interventions    Readmission Risk Interventions No flowsheet data found.

## 2021-01-01 NOTE — Progress Notes (Signed)
Speech Language Pathology Treatment: Dysphagia  Patient Details Name: Charles Ray MRN: [id number] DOB: 1951/01/12 Today's Date: 01/01/2021 Time: 1761-6073 SLP Time Calculation (min) (ACUTE ONLY): 50 min  Assessment / Plan / Recommendation Clinical Impression  Pt Covid positive in isolation room. ST took lunch tray in room and asissted with entire meal. Pt at 75 percent of meal. Noted less coughing but still with occasional wet congested cough. Pt tolerated 6 oz of nectar thick liquid and had no s/s of aspiration. Pt needed extended time to masticate chopped foods but was able to c lear with sips of nectar thick liquids. Rec continue with Dys 2 chopped with nectar thick liquids. As Pt continues to improve, will try thinner liquids and try dentures again at meals. Dysphagia tx to focus on safest feeding and diet toleration. ST to f/u 2x-3 week   HPI HPI: Per admitting H&P "Charles Ray is a 70 y.o. male with medical history significant of previous hip fracture in 06/2020, alcohol use, tobacco use, patient is currently living with his son in a motel.  He was previously living with his wife until a week ago.  Review of records indicate that patient has been having some issues with his memory as well as having significant weight loss.  He reports that for the past week he has had decreased p.o. intake and lack of appetite.  He has had a cough which has been nonproductive.  Denies any fever or shortness of breath.  Denies any dysuria.  No nausea or vomiting.  He has been increasingly weak.  Records indicate that family has had difficulty getting him up.  He does have some chronic issues with hip pain from his surgery earlier this year.  It has been even harder to get him up and moving now."      SLP Plan F/u with toleration of diet 2-3 days unless notified of difficulty with current diet.         Recommendations for follow up therapy are one component of a multi-disciplinary discharge planning  process, led by the attending physician.  Recommendations may be updated based on patient status, additional functional criteria and insurance authorization.    Recommendations  Diet recommendations: Dysphagia 2 (fine chop);Nectar-thick liquid Liquids provided via: Cup Medication Administration: Crushed with puree Supervision: Staff to assist with self feeding Compensations: Slow rate;Small sips/bites Postural Changes and/or Swallow Maneuvers: Seated upright 90 degrees;Upright 30-60 min after meal                Follow up Recommendations: Skilled Nursing facility SLP Visit Diagnosis: Dysphagia, oropharyngeal phase (R13.12)       GO                Eather Colas  01/01/2021, 1:36 PM

## 2021-01-02 DIAGNOSIS — F1010 Alcohol abuse, uncomplicated: Secondary | ICD-10-CM | POA: Diagnosis not present

## 2021-01-02 DIAGNOSIS — D75838 Other thrombocytosis: Secondary | ICD-10-CM

## 2021-01-02 DIAGNOSIS — U071 COVID-19: Secondary | ICD-10-CM | POA: Diagnosis not present

## 2021-01-02 DIAGNOSIS — E871 Hypo-osmolality and hyponatremia: Secondary | ICD-10-CM

## 2021-01-02 DIAGNOSIS — R627 Adult failure to thrive: Secondary | ICD-10-CM | POA: Diagnosis not present

## 2021-01-02 DIAGNOSIS — E43 Unspecified severe protein-calorie malnutrition: Secondary | ICD-10-CM | POA: Diagnosis not present

## 2021-01-02 LAB — MAGNESIUM: Magnesium: 2 mg/dL (ref 1.7–2.4)

## 2021-01-02 LAB — BASIC METABOLIC PANEL
Anion gap: 3 — ABNORMAL LOW (ref 5–15)
BUN: 26 mg/dL — ABNORMAL HIGH (ref 8–23)
CO2: 28 mmol/L (ref 22–32)
Calcium: 8.5 mg/dL — ABNORMAL LOW (ref 8.9–10.3)
Chloride: 104 mmol/L (ref 98–111)
Creatinine, Ser: 0.85 mg/dL (ref 0.61–1.24)
GFR, Estimated: >60 mL/min (ref >60)
Glucose, Bld: 102 mg/dL — ABNORMAL HIGH (ref 70–99)
Potassium: 4.7 mmol/L (ref 3.5–5.1)
Sodium: 135 mmol/L (ref 135–145)

## 2021-01-02 LAB — CULTURE, BLOOD (SINGLE)
Culture: NO GROWTH
Culture: NO GROWTH
Special Requests: ADEQUATE

## 2021-01-02 LAB — AMMONIA: Ammonia: 16 umol/L (ref 9–35)

## 2021-01-02 NOTE — Progress Notes (Signed)
PROGRESS NOTE    Charles Ray  OEU:[id number] DOB: 02-Dec-1950 DOA: 12/28/2020 PCP: Jerl Mina, MD   Chief complaint.  Weakness. Brief Narrative:  70 year old man with history of alcohol abuse tobacco abuse previous history of fracture back in 06/2020.  He has been having some issues with memory and weight loss.  Brought in the hospital with weakness and cough and found to have pneumonia and COVID-19 infection.  Admitted 12/28/2020. Patient is treated with remdesivir.  Not developed hypoxemia.  Patient was also has significant weakness, seen by PT, recommended nursing placement. Patient is also evaluated by speech therapy, deemed to be high risk with aspiration.  Placed on nectar thick liquid.  Also treated with Augmentin for aspiration pneumonia.     Assessment & Plan:   Active Problems:   Hepatitis C   Tobacco abuse   Alcohol abuse   Pneumonia due to COVID-19 virus   Weakness   Failure to thrive in adult   Severe protein-calorie malnutrition (HCC)   #1.  Left lower lobe aspiration pneumonia. COVID-19 pneumonia. Condition much improved, no hypoxemia.  Remdesivir completed.  Continue oral Augmentin.  #2.  Failure to thrive. Anorexia. Severe protein calorie malnutrition. Generalized weakness Pancreatic insufficiency. Acute metabolic encephalopathy. Patient finally improved after added Creon.  He was eating  100% breakfast this morning.  Condition more consistent with pancreatic insufficiency from chronic alcoholism. I will continue Creon, continue Marinol for another day, plan to discontinue tomorrow. Patient has been sleepy for entire stay of the hospitalization, he had acute metabolic cephalopathy at time admission.  Condition seem to improved today.  #3.  Alcoholic dementia with alcohol abuse. Condition stable.   DVT prophylaxis: Lovenox Code Status: full Family Communication:  Disposition Plan:    Status is: Inpatient   Remains inpatient appropriate  because:Unsafe d/c plan and Inpatient level of care appropriate due to severity of illness   Dispo: The patient is from: Home              Anticipated d/c is to: SNF              Patient currently is not medically stable to d/c.              Difficult to place patient No     I/O last 3 completed shifts: In: 461 [P.O.:436; Other:25] Out: 1800 [Urine:1450; Emesis/NG output:350] No intake/output data recorded.    Consultants:  None   Procedures: None   Antimicrobials: Augmentin   Subjective: Patient mental status improved, he is now alert and oriented to time place and person. His appetite is slightly improved today, he was eating 100% of breakfast, he feels hungry. No nausea vomiting abdominal pain. No short of breath or cough, no hypoxia. No dysuria hematuria  No fever or chills.  Objective: Vitals:   01/01/21 2125 01/01/21 2349 01/02/21 0500 01/02/21 0631  BP: 123/76 (!) 112/55  130/77  Pulse: 95 96  91  Resp: 16 18  16   Temp: 98.6 F (37 C) 98.5 F (36.9 C)  97.6 F (36.4 C)  TempSrc: Oral Oral  Oral  SpO2: 94% 95%  94%  Weight:   57.2 kg   Height:        Intake/Output Summary (Last 24 hours) at 01/02/2021 0945 Last data filed at 01/02/2021 0643 Gross per 24 hour  Intake 236 ml  Output 1600 ml  Net -1364 ml   Filed Weights   12/28/20 1042 01/02/21 0500  Weight: 70.3 kg 57.2 kg  Examination:  General exam: Appears calm and comfortable, appear malnourished Respiratory system: Clear to auscultation. Respiratory effort normal. Cardiovascular system: S1 & S2 heard, RRR. No JVD, murmurs, rubs, gallops or clicks. No pedal edema. Gastrointestinal system: Abdomen is nondistended, soft and nontender. No organomegaly or masses felt. Normal bowel sounds heard. Central nervous system: Alert and oriented x3. No focal neurological deficits. Extremities: Significant muscle atrophy Skin: No rashes, lesions or ulcers Psychiatry: Judgement and insight appear normal.  Mood & affect appropriate.     Data Reviewed: I have personally reviewed following labs and imaging studies  CBC: Recent Labs  Lab 12/28/20 1052 12/29/20 0740 12/30/20 0433 12/31/20 0557  WBC 19.1* 11.1* 10.3 11.0*  NEUTROABS  --  7.8* 7.3 8.0*  HGB 14.6 13.5 12.5* 13.5  HCT 44.0 40.4 38.2* 40.2  MCV 92.6 91.0 90.3 89.3  PLT 513* 413* 472* 427*   Basic Metabolic Panel: Recent Labs  Lab 12/28/20 1052 12/29/20 0740 12/30/20 0433 12/31/20 0557 01/02/21 0531  NA 137 139 138 134* 135  K 4.2 3.7 3.9 3.7 4.7  CL 103 105 105 100 104  CO2 22 25 26 24 28   GLUCOSE 105* 95 71 81 102*  BUN 26* 28* 24* 24* 26*  CREATININE 1.09 0.93 0.86 0.89 0.85  CALCIUM 9.1 8.5* 8.5* 8.7* 8.5*  MG  --  2.0 1.9 2.0 2.0  PHOS  --  3.5 3.1 2.9  --    GFR: Estimated Creatinine Clearance: 63.6 mL/min (by C-G formula based on SCr of 0.85 mg/dL). Liver Function Tests: Recent Labs  Lab 12/28/20 1052 12/29/20 0740 12/30/20 0433 12/31/20 0557  AST 15 18 13* 23  ALT 11 11 11 15   ALKPHOS 148* 128* 116 114  BILITOT 0.7 0.4 0.7 0.7  PROT 7.3 6.8 6.3* 6.4*  ALBUMIN 3.3* 3.0* 2.7* 2.7*   No results for input(s): LIPASE, AMYLASE in the last 168 hours. Recent Labs  Lab 01/02/21 0531  AMMONIA 16   Coagulation Profile: No results for input(s): INR, PROTIME in the last 168 hours. Cardiac Enzymes: No results for input(s): CKTOTAL, CKMB, CKMBINDEX, TROPONINI in the last 168 hours. BNP (last 3 results) No results for input(s): PROBNP in the last 8760 hours. HbA1C: No results for input(s): HGBA1C in the last 72 hours. CBG: No results for input(s): GLUCAP in the last 168 hours. Lipid Profile: No results for input(s): CHOL, HDL, LDLCALC, TRIG, CHOLHDL, LDLDIRECT in the last 72 hours. Thyroid Function Tests: No results for input(s): TSH, T4TOTAL, FREET4, T3FREE, THYROIDAB in the last 72 hours. Anemia Panel: Recent Labs    12/31/20 0557  FERRITIN 608*   Sepsis Labs: Recent Labs  Lab  12/28/20 1259 12/28/20 1344 12/28/20 1736 12/29/20 0740 12/30/20 0433  PROCALCITON 0.11  --   --  <0.10 <0.10  LATICACIDVEN  --  2.9* 1.3  --   --     Recent Results (from the past 240 hour(s))  Blood culture (single)     Status: None   Collection Time: 12/28/20  1:44 PM   Specimen: BLOOD  Result Value Ref Range Status   Specimen Description BLOOD BLOOD LEFT FOREARM  Final   Special Requests   Final    BOTTLES DRAWN AEROBIC AND ANAEROBIC Blood Culture adequate volume   Culture   Final    NO GROWTH 5 DAYS Performed at [facility], [address], [place], [address] [place]    Report Status 01/02/2021 FINAL  Final  Resp Panel by RT-PCR (Flu A&B, Covid)  Nasopharyngeal Swab     Status: Abnormal   Collection Time: 12/28/20  1:44 PM   Specimen: Nasopharyngeal Swab; Nasopharyngeal(NP) swabs in vial transport medium  Result Value Ref Range Status   SARS Coronavirus 2 by RT PCR POSITIVE (A) NEGATIVE Final    Comment: RESULT CALLED TO, READ BACK BY AND VERIFIED WITH: MARY FOUNKE AT 1714 ON 12/28/20 BY SS (NOTE) SARS-CoV-2 target nucleic acids are DETECTED.  The SARS-CoV-2 RNA is generally detectable in upper respiratory specimens during the acute phase of infection. Positive results are indicative of the presence of the identified virus, but do not rule out bacterial infection or co-infection with other pathogens not detected by the test. Clinical correlation with patient history and other diagnostic information is necessary to determine patient infection status. The expected result is Negative.  Fact Sheet for Patients: BloggerCourse.com  Fact Sheet for Healthcare Providers: SeriousBroker.it  This test is not yet approved or cleared by the [place] FDA and  has been authorized for detection and/or diagnosis of SARS-CoV-2 by FDA under an Emergency Use Authorization (EUA).  This EUA will remain in effect (meaning  this test can  be used) for the duration of  the COVID-19 declaration under Section 564(b)(1) of the Act, 21 U.S.C. section 360bbb-3(b)(1), unless the authorization is terminated or revoked sooner.     Influenza A by PCR NEGATIVE NEGATIVE Final   Influenza B by PCR NEGATIVE NEGATIVE Final    Comment: (NOTE) The Xpert Xpress SARS-CoV-2/FLU/RSV plus assay is intended as an aid in the diagnosis of influenza from Nasopharyngeal swab specimens and should not be used as a sole basis for treatment. Nasal washings and aspirates are unacceptable for Xpert Xpress SARS-CoV-2/FLU/RSV testing.  Fact Sheet for Patients: BloggerCourse.com  Fact Sheet for Healthcare Providers: SeriousBroker.it  This test is not yet approved or cleared by the [place] FDA and has been authorized for detection and/or diagnosis of SARS-CoV-2 by FDA under an Emergency Use Authorization (EUA). This EUA will remain in effect (meaning this test can be used) for the duration of the COVID-19 declaration under Section 564(b)(1) of the Act, 21 U.S.C. section 360bbb-3(b)(1), unless the authorization is terminated or revoked.  Performed at [facility], [address] [place], [place] [address]   Culture, blood (single)     Status: None   Collection Time: 12/28/20  5:36 PM   Specimen: BLOOD  Result Value Ref Range Status   Specimen Description BLOOD RIGHT ANTECUBITAL  Final   Special Requests   Final    BOTTLES DRAWN AEROBIC AND ANAEROBIC Blood Culture results may not be optimal due to an excessive volume of blood received in culture bottles   Culture   Final    NO GROWTH 5 DAYS Performed at [facility], [address], [place], [place] [address]    Report Status 01/02/2021 FINAL  Final         Radiology Studies: No results found.      Scheduled Meds:  albuterol  2 puff Inhalation Q6H   amitriptyline  300 mg Oral QHS    amoxicillin-clavulanate  1 tablet Oral BID   dronabinol  2.5 mg Oral BID AC   enoxaparin (LOVENOX) injection  40 mg Subcutaneous Q24H   feeding supplement  237 mL Oral TID BM   folic acid  1 mg Oral Daily   lipase/protease/amylase  12,000 Units Oral TID WC   multivitamin with minerals  1 tablet Oral Daily   nicotine  21 mg Transdermal  Daily   senna-docusate  2 tablet Oral BID   thiamine  100 mg Oral Daily   Or   thiamine  100 mg Intravenous Daily   Continuous Infusions:   LOS: 4 days    Time spent: 28 minutes    Marrion Coy, MD [facility]   To contact the attending provider between 7A-7P or the covering provider during after hours 7P-7A, please log into the web site [email or web address] and access using universal [place] password for that web site. If you do not have the password, please call the hospital operator.  01/02/2021, 9:45 AM

## 2021-01-03 DIAGNOSIS — U071 COVID-19: Secondary | ICD-10-CM | POA: Diagnosis not present

## 2021-01-03 DIAGNOSIS — J1282 Pneumonia due to coronavirus disease 2019: Secondary | ICD-10-CM | POA: Diagnosis not present

## 2021-01-03 DIAGNOSIS — R627 Adult failure to thrive: Secondary | ICD-10-CM | POA: Diagnosis not present

## 2021-01-03 LAB — BASIC METABOLIC PANEL
Anion gap: 6 (ref 5–15)
BUN: 30 mg/dL — ABNORMAL HIGH (ref 8–23)
CO2: 28 mmol/L (ref 22–32)
Calcium: 8.4 mg/dL — ABNORMAL LOW (ref 8.9–10.3)
Chloride: 100 mmol/L (ref 98–111)
Creatinine, Ser: 0.89 mg/dL (ref 0.61–1.24)
GFR, Estimated: >60 mL/min (ref >60)
Glucose, Bld: 117 mg/dL — ABNORMAL HIGH (ref 70–99)
Potassium: 4.6 mmol/L (ref 3.5–5.1)
Sodium: 134 mmol/L — ABNORMAL LOW (ref 135–145)

## 2021-01-03 LAB — MAGNESIUM: Magnesium: 1.9 mg/dL (ref 1.7–2.4)

## 2021-01-03 LAB — PHOSPHORUS: Phosphorus: 3.9 mg/dL (ref 2.5–4.6)

## 2021-01-03 NOTE — Progress Notes (Signed)
PROGRESS NOTE    Charles Ray  KMM:[id number] DOB: 04/20/1950 DOA: 12/28/2020 PCP: Jerl Mina, MD   Chief complaint.  Weakness. Brief Narrative:  70 year old man with history of alcohol abuse tobacco abuse previous history of fracture back in 06/2020.  He has been having some issues with memory and weight loss.  Brought in the hospital with weakness and cough and found to have pneumonia and COVID-19 infection.  Admitted 12/28/2020. Patient is treated with remdesivir.  Not developed hypoxemia.  Patient was also has significant weakness, seen by PT, recommended nursing placement. Patient is also evaluated by speech therapy, deemed to be high risk with aspiration.  Placed on nectar thick liquid.  Also treated with Augmentin for aspiration pneumonia.   Assessment & Plan:   Active Problems:   Hepatitis C   Tobacco abuse   Alcohol abuse   Pneumonia due to COVID-19 virus   Weakness   Failure to thrive in adult   Severe protein-calorie malnutrition (HCC)   Reactive thrombocytosis   Hyponatremia  Left lower lobe aspiration pneumonia. COVID-19 pneumonia. Condition has improved, completed antibiotics today. No hypoxemia.  Failure to thrive. Anorexia. Severe protein calorie malnutrition. Generalized weakness Pancreatic insufficiency. Acute metabolic encephalopathy. Condition improving, patient has a better appetite on Creon. Continue to follow.  Pending nursing home placement.  Alcoholic dementia with alcohol abuse. Mental status has improved.     DVT prophylaxis: Lovenox Code Status: full Family Communication: Son updated at bedside. Disposition Plan:      Status is: Inpatient     Remains inpatient appropriate because:Unsafe d/c plan and Inpatient level of care appropriate due to severity of illness   Dispo: The patient is from: Home              Anticipated d/c is to: SNF              Patient currently is medically stable to d/c.              Difficult to place  patient No      I/O last 3 completed shifts: In: 590 [P.O.:590] Out: 2600 [Urine:1700; Other:900] No intake/output data recorded.     Consultants:  None  Procedures: None  Antimicrobials: None  Subjective: Patient doing much better today.  His left arm has area of redness from previous IV site, ice applied. Appetite much improved today, no nausea vomiting.  No additional diarrhea. No short of breath or cough. No dysuria hematuria. No fever or chills.  Objective: Vitals:   01/03/21 0124 01/03/21 0409 01/03/21 0417 01/03/21 0909  BP: 108/73 120/77  123/73  Pulse: 96 94  100  Resp: 15 18  16   Temp: 98.2 F (36.8 C) 98.4 F (36.9 C)  98.2 F (36.8 C)  TempSrc: Oral Oral  Oral  SpO2: 96% 96%  94%  Weight:   57.1 kg   Height:        Intake/Output Summary (Last 24 hours) at 01/03/2021 1212 Last data filed at 01/02/2021 2322 Gross per 24 hour  Intake 354 ml  Output 1350 ml  Net -996 ml   Filed Weights   12/28/20 1042 01/02/21 0500 01/03/21 0417  Weight: 70.3 kg 57.2 kg 57.1 kg    Examination:  General exam: Appears calm and comfortable  Respiratory system: Clear to auscultation. Respiratory effort normal. Cardiovascular system: S1 & S2 heard, RRR. No JVD, murmurs, rubs, gallops or clicks. No pedal edema. Gastrointestinal system: Abdomen is nondistended, soft and nontender. No organomegaly or masses felt.  Normal bowel sounds heard. Central nervous system: Alert and oriented x2. No focal neurological deficits. Extremities: Symmetric  Skin: No rashes, lesions or ulcers Psychiatry: Judgement and insight appear normal. Mood & affect appropriate.     Data Reviewed: I have personally reviewed following labs and imaging studies  CBC: Recent Labs  Lab 12/28/20 1052 12/29/20 0740 12/30/20 0433 12/31/20 0557  WBC 19.1* 11.1* 10.3 11.0*  NEUTROABS  --  7.8* 7.3 8.0*  HGB 14.6 13.5 12.5* 13.5  HCT 44.0 40.4 38.2* 40.2  MCV 92.6 91.0 90.3 89.3  PLT 513* 413*  472* 427*   Basic Metabolic Panel: Recent Labs  Lab 12/29/20 0740 12/30/20 0433 12/31/20 0557 01/02/21 0531 01/03/21 0358  NA 139 138 134* 135 134*  K 3.7 3.9 3.7 4.7 4.6  CL 105 105 100 104 100  CO2 25 26 24 28 28   GLUCOSE 95 71 81 102* 117*  BUN 28* 24* 24* 26* 30*  CREATININE 0.93 0.86 0.89 0.85 0.89  CALCIUM 8.5* 8.5* 8.7* 8.5* 8.4*  MG 2.0 1.9 2.0 2.0 1.9  PHOS 3.5 3.1 2.9  --  3.9   GFR: Estimated Creatinine Clearance: 60.6 mL/min (by C-G formula based on SCr of 0.89 mg/dL). Liver Function Tests: Recent Labs  Lab 12/28/20 1052 12/29/20 0740 12/30/20 0433 12/31/20 0557  AST 15 18 13* 23  ALT 11 11 11 15   ALKPHOS 148* 128* 116 114  BILITOT 0.7 0.4 0.7 0.7  PROT 7.3 6.8 6.3* 6.4*  ALBUMIN 3.3* 3.0* 2.7* 2.7*   No results for input(s): LIPASE, AMYLASE in the last 168 hours. Recent Labs  Lab 01/02/21 0531  AMMONIA 16   Coagulation Profile: No results for input(s): INR, PROTIME in the last 168 hours. Cardiac Enzymes: No results for input(s): CKTOTAL, CKMB, CKMBINDEX, TROPONINI in the last 168 hours. BNP (last 3 results) No results for input(s): PROBNP in the last 8760 hours. HbA1C: No results for input(s): HGBA1C in the last 72 hours. CBG: No results for input(s): GLUCAP in the last 168 hours. Lipid Profile: No results for input(s): CHOL, HDL, LDLCALC, TRIG, CHOLHDL, LDLDIRECT in the last 72 hours. Thyroid Function Tests: No results for input(s): TSH, T4TOTAL, FREET4, T3FREE, THYROIDAB in the last 72 hours. Anemia Panel: No results for input(s): VITAMINB12, FOLATE, FERRITIN, TIBC, IRON, RETICCTPCT in the last 72 hours. Sepsis Labs: Recent Labs  Lab 12/28/20 1259 12/28/20 1344 12/28/20 1736 12/29/20 0740 12/30/20 0433  PROCALCITON 0.11  --   --  <0.10 <0.10  LATICACIDVEN  --  2.9* 1.3  --   --     Recent Results (from the past 240 hour(s))  Blood culture (single)     Status: None   Collection Time: 12/28/20  1:44 PM   Specimen: BLOOD  Result  Value Ref Range Status   Specimen Description BLOOD BLOOD LEFT FOREARM  Final   Special Requests   Final    BOTTLES DRAWN AEROBIC AND ANAEROBIC Blood Culture adequate volume   Culture   Final    NO GROWTH 5 DAYS Performed at [facility], [address], [place], [address] [place]    Report Status 01/02/2021 FINAL  Final  Resp Panel by RT-PCR (Flu A&B, Covid) Nasopharyngeal Swab     Status: Abnormal   Collection Time: 12/28/20  1:44 PM   Specimen: Nasopharyngeal Swab; Nasopharyngeal(NP) swabs in vial transport medium  Result Value Ref Range Status   SARS Coronavirus 2 by RT PCR POSITIVE (A) NEGATIVE Final    Comment: RESULT  CALLED TO, READ BACK BY AND VERIFIED WITH: Stephannie Peters AT 1714 ON 12/28/20 BY SS (NOTE) SARS-CoV-2 target nucleic acids are DETECTED.  The SARS-CoV-2 RNA is generally detectable in upper respiratory specimens during the acute phase of infection. Positive results are indicative of the presence of the identified virus, but do not rule out bacterial infection or co-infection with other pathogens not detected by the test. Clinical correlation with patient history and other diagnostic information is necessary to determine patient infection status. The expected result is Negative.  Fact Sheet for Patients: BloggerCourse.com  Fact Sheet for Healthcare Providers: SeriousBroker.it  This test is not yet approved or cleared by the [place] FDA and  has been authorized for detection and/or diagnosis of SARS-CoV-2 by FDA under an Emergency Use Authorization (EUA).  This EUA will remain in effect (meaning this test can  be used) for the duration of  the COVID-19 declaration under Section 564(b)(1) of the Act, 21 U.S.C. section 360bbb-3(b)(1), unless the authorization is terminated or revoked sooner.     Influenza A by PCR NEGATIVE NEGATIVE Final   Influenza B by PCR NEGATIVE NEGATIVE Final    Comment:  (NOTE) The Xpert Xpress SARS-CoV-2/FLU/RSV plus assay is intended as an aid in the diagnosis of influenza from Nasopharyngeal swab specimens and should not be used as a sole basis for treatment. Nasal washings and aspirates are unacceptable for Xpert Xpress SARS-CoV-2/FLU/RSV testing.  Fact Sheet for Patients: BloggerCourse.com  Fact Sheet for Healthcare Providers: SeriousBroker.it  This test is not yet approved or cleared by the [place] FDA and has been authorized for detection and/or diagnosis of SARS-CoV-2 by FDA under an Emergency Use Authorization (EUA). This EUA will remain in effect (meaning this test can be used) for the duration of the COVID-19 declaration under Section 564(b)(1) of the Act, 21 U.S.C. section 360bbb-3(b)(1), unless the authorization is terminated or revoked.  Performed at [facility], [address] [place], [place] [address]   Culture, blood (single)     Status: None   Collection Time: 12/28/20  5:36 PM   Specimen: BLOOD  Result Value Ref Range Status   Specimen Description BLOOD RIGHT ANTECUBITAL  Final   Special Requests   Final    BOTTLES DRAWN AEROBIC AND ANAEROBIC Blood Culture results may not be optimal due to an excessive volume of blood received in culture bottles   Culture   Final    NO GROWTH 5 DAYS Performed at [facility], [address] [place], [place] [address]    Report Status 01/02/2021 FINAL  Final         Radiology Studies: No results found.      Scheduled Meds:  albuterol  2 puff Inhalation Q6H   amitriptyline  300 mg Oral QHS   dronabinol  2.5 mg Oral BID AC   enoxaparin (LOVENOX) injection  40 mg Subcutaneous Q24H   feeding supplement  237 mL Oral TID BM   folic acid  1 mg Oral Daily   lipase/protease/amylase  12,000 Units Oral TID WC   multivitamin with minerals  1 tablet Oral Daily   nicotine  21 mg Transdermal Daily    senna-docusate  2 tablet Oral BID   thiamine  100 mg Oral Daily   Or   thiamine  100 mg Intravenous Daily   Continuous Infusions:   LOS: 5 days    Time spent: 28 minutes    Marrion Coy, MD [facility]   To contact the attending  provider between 7A-7P or the covering provider during after hours 7P-7A, please log into the web site [email or web address] and access using universal [place] password for that web site. If you do not have the password, please call the hospital operator.  01/03/2021, 12:12 PM

## 2021-01-04 DIAGNOSIS — J1282 Pneumonia due to coronavirus disease 2019: Secondary | ICD-10-CM | POA: Diagnosis not present

## 2021-01-04 DIAGNOSIS — R627 Adult failure to thrive: Secondary | ICD-10-CM | POA: Diagnosis not present

## 2021-01-04 DIAGNOSIS — F1010 Alcohol abuse, uncomplicated: Secondary | ICD-10-CM | POA: Diagnosis not present

## 2021-01-04 DIAGNOSIS — U071 COVID-19: Secondary | ICD-10-CM | POA: Diagnosis not present

## 2021-01-04 LAB — CREATININE, SERUM
Creatinine, Ser: 1 mg/dL (ref 0.61–1.24)
GFR, Estimated: >60 mL/min (ref >60)

## 2021-01-04 NOTE — Progress Notes (Signed)
Speech Language Pathology Treatment:    Patient Details Name: Charles Ray MRN: [id number] DOB: Jun 20, 1950 Today's Date: 01/04/2021 Time:  -     Assessment / Plan / Recommendation Clinical Impression  Pt seen for dysphagia tx at lunch today. Pt is tolerating dys 2 diet without difficulty. Directly observed with PO's with no overt s/s of aspiration. Pt was wearing his dentures and had slow but adequate oral transit. Cough is much improved. No coughing noted during session today. Pt also tolerated 4 oz of thin liquids with no s/s of aspiration. Rec upgrade diet to Dys 3 with only chopped meats and thin liquids. ST to follow up with toleration of diet.  HPI Per admitting H & P        SLP Plan F/u with new diet         Recommendations for follow up therapy are one component of a multi-disciplinary discharge planning process, led by the attending physician.  Recommendations may be updated based on patient status, additional functional criteria and insurance authorization.    Recommendations                           GO                Eather Colas  01/04/2021, 1:16 PM

## 2021-01-04 NOTE — Progress Notes (Signed)
Occupational Therapy Treatment Patient Details Name: Charles Ray MRN: [id number] DOB: [birth year] Today's Date: 01/04/2021   History of present illness 70 y.o. male with medical history significant of previous hip fracture in 06/2020 (s/p L THA anterior approach 06/19/20), alcohol use, tobacco use, patient is currently living with his son in a motel.  He was previously living with his wife until a week ago.  Review of records indicate that patient has been having some issues with his memory as well as having significant weight loss.  He reports that for the past week he has had decreased p.o. intake and lack of appetite.  He has had a cough which has been nonproductive.  Denies any fever or shortness of breath.  Denies any dysuria.  No nausea or vomiting.  He has been increasingly weak.  Records indicate that family has had difficulty getting him up.  He does have some chronic issues with hip pain from his surgery earlier this year.  It has been even harder to get him up and moving now.   OT comments  Upon entering the room, pt supine in bed with family present in the room sleeping. Pt is very alert and requesting assistance for toileting. Pt performed bed mobility with min A and stands with mod A from EOB. Initially pt with posterior bias in standing but is able to ambulate with RW 10' into bathroom with mod A. Pt needing assistance advancing RW and cuing to remain within for safety. Pt seated on elevated toilet but unable to void mod A to stand and return to bed in same manner as above. Pt does fatigue quickly but very motivated and more alert than previous sessions. Pt making progress towards OT goals.    Recommendations for follow up therapy are one component of a multi-disciplinary discharge planning process, led by the attending physician.  Recommendations may be updated based on patient status, additional functional criteria and insurance authorization.    Follow Up Recommendations   SNF;Supervision/Assistance - 24 hour    Equipment Recommendations  Other (comment) (defer to next venue of care)       Precautions / Restrictions Precautions Precautions: Fall Restrictions Weight Bearing Restrictions: No       Mobility Bed Mobility Overal bed mobility: Needs Assistance Bed Mobility: Supine to Sit;Sit to Supine     Supine to sit: Min assist Sit to supine: Min assist   General bed mobility comments: assist for trunk support to EOB.    Transfers Overall transfer level: Needs assistance Equipment used: Rolling walker (2 wheeled) Transfers: Sit to/from Stand Sit to Stand: Mod assist Stand pivot transfers: Mod assist       General transfer comment: mod A secondary to weakness and cuing for hand placement and technique. No knee buckling this session.    Balance Overall balance assessment: Needs assistance Sitting-balance support: No upper extremity supported;Feet supported Sitting balance-Leahy Scale: Good Sitting balance - Comments: steady static sitting   Standing balance support: During functional activity Standing balance-Leahy Scale: Poor Standing balance comment: B UE support and decreased balance with standing                           ADL either performed or assessed with clinical judgement   ADL Overall ADL's : Needs assistance/impaired                         Toilet Transfer: Moderate assistance;Comfort height toilet;Ambulation;RW  Toileting- Clothing Manipulation and Hygiene: Moderate assistance;Sit to/from stand       Functional mobility during ADLs: Moderate assistance;Rolling walker       Vision Patient Visual Report: No change from baseline            Cognition Arousal/Alertness: Awake/alert Behavior During Therapy: WFL for tasks assessed/performed Overall Cognitive Status: History of cognitive impairments - at baseline                                 General Comments: Cognition  appears much clearer this session and pt is alert and agreeable to OT intervention.                   Pertinent Vitals/ Pain       Pain Assessment: No/denies pain   Frequency  Min 2X/week        Progress Toward Goals  OT Goals(current goals can now be found in the care plan section)  Progress towards OT goals: Progressing toward goals  Acute Rehab OT Goals Patient Stated Goal: to get better OT Goal Formulation: With patient Time For Goal Achievement: 01/12/21 Potential to Achieve Goals: Good  Plan Discharge plan remains appropriate;Frequency remains appropriate       AM-PAC OT "6 Clicks" Daily Activity     Outcome Measure   Help from another person eating meals?: None Help from another person taking care of personal grooming?: A Little Help from another person toileting, which includes using toliet, bedpan, or urinal?: A Lot Help from another person bathing (including washing, rinsing, drying)?: A Lot Help from another person to put on and taking off regular upper body clothing?: A Little Help from another person to put on and taking off regular lower body clothing?: A Lot 6 Click Score: 16    End of Session    OT Visit Diagnosis: Unsteadiness on feet (R26.81);Muscle weakness (generalized) (M62.81);Repeated falls (R29.6);History of falling (Z91.81)   Activity Tolerance Patient limited by fatigue;Patient tolerated treatment well   Patient Left in bed;with call bell/phone within reach;with bed alarm set;with family/visitor present   Nurse Communication Mobility status        Time: 0177-9390 OT Time Calculation (min): 23 min  Charges: OT General Charges $OT Visit: 1 Visit OT Treatments $Self Care/Home Management : 23-37 mins  Jackquline Denmark, MS, OTR/L , CBIS ascom [phone]  01/04/21, 2:41 PM

## 2021-01-04 NOTE — Progress Notes (Signed)
Physical Therapy Treatment Patient Details Name: Charles Ray MRN: [id number] DOB: 01-28-1951 Today's Date: 01/04/2021   History of Present Illness 70 y.o. male with medical history significant of previous hip fracture in 06/2020 (s/p L THA anterior approach 06/19/20), alcohol use, tobacco use, patient is currently living with his son in a motel.  He was previously living with his wife until a week ago.  Review of records indicate that patient has been having some issues with his memory as well as having significant weight loss.  He reports that for the past week he has had decreased p.o. intake and lack of appetite.  He has had a cough which has been nonproductive.  Denies any fever or shortness of breath.  Denies any dysuria.  No nausea or vomiting.  He has been increasingly weak.  Records indicate that family has had difficulty getting him up.  He does have some chronic issues with hip pain from his surgery earlier this year.  It has been even harder to get him up and moving now.    PT Comments    Pt resting in bed upon PT arrival; pt's family present.  SBA semi-supine to sitting edge of bed; min assist with transfers; and CGA to min assist to ambulate 40 feet with RW in room.  Limited distance ambulating d/t pt fatigue (pt assisted back to bed per pt request d/t being tired and wanting to rest).  Will continue to focus on strengthening, activity tolerance, and progressive functional mobility per pt tolerance.    Recommendations for follow up therapy are one component of a multi-disciplinary discharge planning process, led by the attending physician.  Recommendations may be updated based on patient status, additional functional criteria and insurance authorization.  Follow Up Recommendations  SNF     Equipment Recommendations  Rolling walker with 5" wheels;3in1 (PT);Wheelchair (measurements PT);Wheelchair cushion (measurements PT)    Recommendations for Other Services OT consult      Precautions / Restrictions Precautions Precautions: Fall Restrictions Weight Bearing Restrictions: No     Mobility  Bed Mobility Overal bed mobility: Needs Assistance Bed Mobility: Supine to Sit;Sit to Supine     Supine to sit: Supervision;HOB elevated Sit to supine: Min assist;HOB elevated (assist for LE's)   General bed mobility comments: vc's for technique    Transfers Overall transfer level: Needs assistance Equipment used: Rolling walker (2 wheeled) Transfers: Sit to/from Stand Sit to Stand: Min assist       General transfer comment: assist to initiate stand up to walker and control descent sitting onto bed  Ambulation/Gait Ambulation/Gait assistance: Min guard;Min assist Gait Distance (Feet): 40 Feet Assistive device: Rolling walker (2 wheeled)   Gait velocity: decreased   General Gait Details: vc's to stay within RW during turns; decreased B LE step length/foot clearance/heelstrike   Stairs             Wheelchair Mobility    Modified Rankin (Stroke Patients Only)       Balance Overall balance assessment: Needs assistance Sitting-balance support: No upper extremity supported;Feet supported Sitting balance-Leahy Scale: Good Sitting balance - Comments: steady sitting reaching within BOS   Standing balance support: Single extremity supported Standing balance-Leahy Scale: Fair Standing balance comment: requires at least single UE support for static standing balance                            Cognition Arousal/Alertness: Awake/alert Behavior During Therapy: [facility] for tasks assessed/performed  Overall Cognitive Status: History of cognitive impairments - at baseline                                 General Comments: Pt alert and did well with one-step cueing      Exercises      General Comments  Pt agreeable to PT session.      Pertinent Vitals/Pain Pain Assessment: No/denies pain Vitals (HR and O2 on room air)  stable and WFL throughout treatment session.    Home Living                      Prior Function            PT Goals (current goals can now be found in the care plan section) Acute Rehab PT Goals Patient Stated Goal: to get better PT Goal Formulation: With patient Time For Goal Achievement: 01/13/21 Potential to Achieve Goals: Good Progress towards PT goals: Progressing toward goals    Frequency    Min 2X/week      PT Plan Current plan remains appropriate    Co-evaluation              AM-PAC PT "6 Clicks" Mobility   Outcome Measure  Help needed turning from your back to your side while in a flat bed without using bedrails?: A Little Help needed moving from lying on your back to sitting on the side of a flat bed without using bedrails?: A Little Help needed moving to and from a bed to a chair (including a wheelchair)?: A Little Help needed standing up from a chair using your arms (e.g., wheelchair or bedside chair)?: A Little Help needed to walk in hospital room?: A Little Help needed climbing 3-5 steps with a railing? : A Lot 6 Click Score: 17    End of Session Equipment Utilized During Treatment: Gait belt Activity Tolerance: Patient limited by fatigue Patient left: in bed;with call bell/phone within reach;with bed alarm set;with family/visitor present Nurse Communication: Mobility status;Precautions PT Visit Diagnosis: Unsteadiness on feet (R26.81);Muscle weakness (generalized) (M62.81);History of falling (Z91.81);Other abnormalities of gait and mobility (R26.89)     Time: 8099-8338 PT Time Calculation (min) (ACUTE ONLY): 23 min  Charges:  $Gait Training: 8-22 mins $Therapeutic Activity: 8-22 mins                     Hendricks Limes, PT 01/04/21, 5:30 PM

## 2021-01-04 NOTE — Progress Notes (Signed)
PROGRESS NOTE    Charles Ray  OZY:[id number] DOB: 16-May-1950 DOA: 12/28/2020 PCP: Jerl Mina, MD   Chief complaint.  Weakness. Brief Narrative:   70 year old man with history of alcohol abuse tobacco abuse previous history of fracture back in 06/2020.  He has been having some issues with memory and weight loss.  Brought in the hospital with weakness and cough and found to have pneumonia and COVID-19 infection.  Admitted 12/28/2020. Patient is treated with remdesivir.  Not developed hypoxemia.  Patient was also has significant weakness, seen by PT, recommended nursing placement. Patient is also evaluated by speech therapy, deemed to be high risk with aspiration.  Placed on nectar thick liquid.  Also treated with Augmentin for aspiration pneumonia. Patient condition had improved, appetite is also improved.  Currently pending for nursing home placement.  Assessment & Plan:   Active Problems:   Hepatitis C   Tobacco abuse   Alcohol abuse   Pneumonia due to COVID-19 virus   Weakness   Failure to thrive in adult   Severe protein-calorie malnutrition (HCC)   Reactive thrombocytosis   Hyponatremia  Left lower lobe aspiration pneumonia. COVID-19 pneumonia. Condition had improved.  Failure to thrive. Anorexia. Severe protein calorie malnutrition. Generalized weakness Pancreatic insufficiency. Acute metabolic encephalopathy. Patient is eating better on Creon.  Diarrhea has resolved. Currently pending nursing home placement.  Will need a 10-day quarantine before can be accepted by nursing home.  Alcohol abuse with alcohol dementia. Mental status improved. No evidence of withdrawal.    DVT prophylaxis: Lovenox Code Status: full Family Communication: Son updated at bedside. Disposition Plan:      Status is: Inpatient     Remains inpatient appropriate because:Unsafe d/c plan and Inpatient level of care appropriate due to severity of illness   Dispo: The patient is  from: Home              Anticipated d/c is to: SNF              Patient currently is medically stable to d/c.              Difficult to place patient No       I/O last 3 completed shifts: In: 354 [P.O.:354] Out: 1150 [Urine:1150] No intake/output data recorded.     Consultants:  None  Procedures: None  Antimicrobials: None  Subjective: Patient doing well today, eating 100% of breakfast when I seen him. No abdominal pain or nausea vomiting.  Diarrhea has resolved. Denies any short of breath or cough, no chest pain. No fever or chills. No dysuria or hematuria.  Objective: Vitals:   01/03/21 0909 01/03/21 1241 01/03/21 1954 01/04/21 0838  BP: 123/73 124/78 105/76 130/71  Pulse: 100 93 (!) 102 90  Resp: 16 16 16 15   Temp: 98.2 F (36.8 C) 97.9 F (36.6 C) 97.8 F (36.6 C) 97.7 F (36.5 C)  TempSrc: Oral  Oral   SpO2: 94% 97% 95% 95%  Weight:      Height:        Intake/Output Summary (Last 24 hours) at 01/04/2021 1024 Last data filed at 01/03/2021 1739 Gross per 24 hour  Intake --  Output 700 ml  Net -700 ml   Filed Weights   12/28/20 1042 01/02/21 0500 01/03/21 0417  Weight: 70.3 kg 57.2 kg 57.1 kg    Examination:  General exam: Appears calm and comfortable, appear malnourished Respiratory system: Clear to auscultation. Respiratory effort normal. Cardiovascular system: S1 & S2 heard, RRR.  No JVD, murmurs, rubs, gallops or clicks. No pedal edema. Gastrointestinal system: Abdomen is nondistended, soft and nontender. No organomegaly or masses felt. Normal bowel sounds heard. Central nervous system: Alert and oriented x2. No focal neurological deficits. Extremities: Symmetric 5 x 5 power. Skin: No rashes, lesions or ulcers Psychiatry: Judgement and insight appear normal. Mood & affect appropriate.     Data Reviewed: I have personally reviewed following labs and imaging studies  CBC: Recent Labs  Lab 12/28/20 1052 12/29/20 0740 12/30/20 0433  12/31/20 0557  WBC 19.1* 11.1* 10.3 11.0*  NEUTROABS  --  7.8* 7.3 8.0*  HGB 14.6 13.5 12.5* 13.5  HCT 44.0 40.4 38.2* 40.2  MCV 92.6 91.0 90.3 89.3  PLT 513* 413* 472* 427*   Basic Metabolic Panel: Recent Labs  Lab 12/29/20 0740 12/30/20 0433 12/31/20 0557 01/02/21 0531 01/03/21 0358 01/04/21 0544  NA 139 138 134* 135 134*  --   K 3.7 3.9 3.7 4.7 4.6  --   CL 105 105 100 104 100  --   CO2 25 26 24 28 28   --   GLUCOSE 95 71 81 102* 117*  --   BUN 28* 24* 24* 26* 30*  --   CREATININE 0.93 0.86 0.89 0.85 0.89 1.00  CALCIUM 8.5* 8.5* 8.7* 8.5* 8.4*  --   MG 2.0 1.9 2.0 2.0 1.9  --   PHOS 3.5 3.1 2.9  --  3.9  --    GFR: Estimated Creatinine Clearance: 53.9 mL/min (by C-G formula based on SCr of 1 mg/dL). Liver Function Tests: Recent Labs  Lab 12/28/20 1052 12/29/20 0740 12/30/20 0433 12/31/20 0557  AST 15 18 13* 23  ALT 11 11 11 15   ALKPHOS 148* 128* 116 114  BILITOT 0.7 0.4 0.7 0.7  PROT 7.3 6.8 6.3* 6.4*  ALBUMIN 3.3* 3.0* 2.7* 2.7*   No results for input(s): LIPASE, AMYLASE in the last 168 hours. Recent Labs  Lab 01/02/21 0531  AMMONIA 16   Coagulation Profile: No results for input(s): INR, PROTIME in the last 168 hours. Cardiac Enzymes: No results for input(s): CKTOTAL, CKMB, CKMBINDEX, TROPONINI in the last 168 hours. BNP (last 3 results) No results for input(s): PROBNP in the last 8760 hours. HbA1C: No results for input(s): HGBA1C in the last 72 hours. CBG: No results for input(s): GLUCAP in the last 168 hours. Lipid Profile: No results for input(s): CHOL, HDL, LDLCALC, TRIG, CHOLHDL, LDLDIRECT in the last 72 hours. Thyroid Function Tests: No results for input(s): TSH, T4TOTAL, FREET4, T3FREE, THYROIDAB in the last 72 hours. Anemia Panel: No results for input(s): VITAMINB12, FOLATE, FERRITIN, TIBC, IRON, RETICCTPCT in the last 72 hours. Sepsis Labs: Recent Labs  Lab 12/28/20 1259 12/28/20 1344 12/28/20 1736 12/29/20 0740 12/30/20 0433   PROCALCITON 0.11  --   --  <0.10 <0.10  LATICACIDVEN  --  2.9* 1.3  --   --     Recent Results (from the past 240 hour(s))  Blood culture (single)     Status: None   Collection Time: 12/28/20  1:44 PM   Specimen: BLOOD  Result Value Ref Range Status   Specimen Description BLOOD BLOOD LEFT FOREARM  Final   Special Requests   Final    BOTTLES DRAWN AEROBIC AND ANAEROBIC Blood Culture adequate volume   Culture   Final    NO GROWTH 5 DAYS Performed at [facility], [address], [place], [address] [place]    Report Status 01/02/2021 FINAL  Final  Resp  Panel by RT-PCR (Flu A&B, Covid) Nasopharyngeal Swab     Status: Abnormal   Collection Time: 12/28/20  1:44 PM   Specimen: Nasopharyngeal Swab; Nasopharyngeal(NP) swabs in vial transport medium  Result Value Ref Range Status   SARS Coronavirus 2 by RT PCR POSITIVE (A) NEGATIVE Final    Comment: RESULT CALLED TO, READ BACK BY AND VERIFIED WITH: MARY FOUNKE AT 1714 ON 12/28/20 BY SS (NOTE) SARS-CoV-2 target nucleic acids are DETECTED.  The SARS-CoV-2 RNA is generally detectable in upper respiratory specimens during the acute phase of infection. Positive results are indicative of the presence of the identified virus, but do not rule out bacterial infection or co-infection with other pathogens not detected by the test. Clinical correlation with patient history and other diagnostic information is necessary to determine patient infection status. The expected result is Negative.  Fact Sheet for Patients: BloggerCourse.com  Fact Sheet for Healthcare Providers: SeriousBroker.it  This test is not yet approved or cleared by the [place] FDA and  has been authorized for detection and/or diagnosis of SARS-CoV-2 by FDA under an Emergency Use Authorization (EUA).  This EUA will remain in effect (meaning this test can  be used) for the duration of  the COVID-19 declaration under  Section 564(b)(1) of the Act, 21 U.S.C. section 360bbb-3(b)(1), unless the authorization is terminated or revoked sooner.     Influenza A by PCR NEGATIVE NEGATIVE Final   Influenza B by PCR NEGATIVE NEGATIVE Final    Comment: (NOTE) The Xpert Xpress SARS-CoV-2/FLU/RSV plus assay is intended as an aid in the diagnosis of influenza from Nasopharyngeal swab specimens and should not be used as a sole basis for treatment. Nasal washings and aspirates are unacceptable for Xpert Xpress SARS-CoV-2/FLU/RSV testing.  Fact Sheet for Patients: BloggerCourse.com  Fact Sheet for Healthcare Providers: SeriousBroker.it  This test is not yet approved or cleared by the [place] FDA and has been authorized for detection and/or diagnosis of SARS-CoV-2 by FDA under an Emergency Use Authorization (EUA). This EUA will remain in effect (meaning this test can be used) for the duration of the COVID-19 declaration under Section 564(b)(1) of the Act, 21 U.S.C. section 360bbb-3(b)(1), unless the authorization is terminated or revoked.  Performed at [facility], [address] [place], [place] [address]   Culture, blood (single)     Status: None   Collection Time: 12/28/20  5:36 PM   Specimen: BLOOD  Result Value Ref Range Status   Specimen Description BLOOD RIGHT ANTECUBITAL  Final   Special Requests   Final    BOTTLES DRAWN AEROBIC AND ANAEROBIC Blood Culture results may not be optimal due to an excessive volume of blood received in culture bottles   Culture   Final    NO GROWTH 5 DAYS Performed at [facility], [address], [place], [place] [address]    Report Status 01/02/2021 FINAL  Final         Radiology Studies: No results found.      Scheduled Meds:  albuterol  2 puff Inhalation Q6H   amitriptyline  300 mg Oral QHS   dronabinol  2.5 mg Oral BID AC   enoxaparin (LOVENOX) injection  40 mg Subcutaneous  Q24H   feeding supplement  237 mL Oral TID BM   folic acid  1 mg Oral Daily   lipase/protease/amylase  12,000 Units Oral TID WC   multivitamin with minerals  1 tablet Oral Daily   nicotine  21 mg Transdermal Daily  senna-docusate  2 tablet Oral BID   thiamine  100 mg Oral Daily   Or   thiamine  100 mg Intravenous Daily   Continuous Infusions:   LOS: 6 days    Time spent: 24 minutes    Marrion Coy, MD [facility]   To contact the attending provider between 7A-7P or the covering provider during after hours 7P-7A, please log into the web site [email or web address] and access using universal [place] password for that web site. If you do not have the password, please call the hospital operator.  01/04/2021, 10:24 AM

## 2021-01-05 DIAGNOSIS — D75838 Other thrombocytosis: Secondary | ICD-10-CM | POA: Diagnosis not present

## 2021-01-05 DIAGNOSIS — E871 Hypo-osmolality and hyponatremia: Secondary | ICD-10-CM

## 2021-01-05 DIAGNOSIS — R627 Adult failure to thrive: Secondary | ICD-10-CM | POA: Diagnosis not present

## 2021-01-05 DIAGNOSIS — E43 Unspecified severe protein-calorie malnutrition: Secondary | ICD-10-CM | POA: Diagnosis not present

## 2021-01-05 DIAGNOSIS — F1010 Alcohol abuse, uncomplicated: Secondary | ICD-10-CM | POA: Diagnosis not present

## 2021-01-05 NOTE — Progress Notes (Addendum)
PROGRESS NOTE    Charles Ray  HGD:[id number] DOB: [birth year] DOA: 12/28/2020 PCP: Jerl Mina, MD   Chief complaint.  Weakness. Brief Narrative:   70 year old man with history of alcohol abuse tobacco abuse previous history of fracture back in 06/2020.  He has been having some issues with memory and weight loss.  Brought in the hospital with weakness and cough and found to have pneumonia and COVID-19 infection.  Admitted 12/28/2020. Patient is treated with remdesivir.  Not developed hypoxemia.  Patient was also has significant weakness, seen by PT, recommended nursing placement. Patient is also evaluated by speech therapy, deemed to be high risk with aspiration.  Placed on nectar thick liquid.  Also treated with Augmentin for aspiration pneumonia. Patient condition had improved, appetite is also improved.  Currently pending for nursing home placement.  9/27 family reports appetite better.  He is drinking his protein shake.  Assessment & Plan:   Active Problems:   Hepatitis C   Tobacco abuse   Alcohol abuse   Pneumonia due to COVID-19 virus   Weakness   Failure to thrive in adult   Severe protein-calorie malnutrition (HCC)   Reactive thrombocytosis   Hyponatremia  Left lower lobe aspiration pneumonia. COVID-19 pneumonia. Improved   Failure to thrive. Anorexia. Severe protein calorie malnutrition. Generalized weakness Pancreatic insufficiency. Acute metabolic encephalopathy. Appetite improved  Diarrhea resolved  On creon Currently pending nursing home placement.  Will need 10 days quarantine before being accepted, should end this 9/30   Alcohol abuse with alcohol dementia. MS improved No evidence of withdrawal Not on CIWA protocol Continue thiamine    DVT prophylaxis: Lovenox Code Status: full Family Communication: Son updated at bedside. Disposition Plan:      Status is: Inpatient     Remains inpatient appropriate because:Unsafe d/c plan and  Inpatient level of care appropriate due to severity of illness   Dispo: The patient is from: Home              Anticipated d/c is to: SNF              Patient currently is medically stable to d/c.              Difficult to place patient No  Needs total of 10-day quarantine prior to going to SNF     I/O last 3 completed shifts: In: -  Out: 925 [Urine:925] No intake/output data recorded.     Consultants:  None  Procedures: None  Antimicrobials: None  Subjective: Denies shortness of breath, diarrhea, abdominal pain  Objective: Vitals:   01/04/21 2007 01/05/21 0001 01/05/21 0533 01/05/21 0804  BP: 110/70 108/66 103/65 118/73  Pulse: 100 98 90 85  Resp: 20 18 18 16   Temp: 97.9 F (36.6 C) 97.7 F (36.5 C) 97.9 F (36.6 C) 98.3 F (36.8 C)  TempSrc:   Oral   SpO2: 96% 96% 93% 98%  Weight:      Height:        Intake/Output Summary (Last 24 hours) at 01/05/2021 0813 Last data filed at 01/04/2021 1439 Gross per 24 hour  Intake --  Output 925 ml  Net -925 ml   Filed Weights   12/28/20 1042 01/02/21 0500 01/03/21 0417  Weight: 70.3 kg 57.2 kg 57.1 kg    Examination: Nad, EOMI Cta no w./r RRR, S1-S2 no gallops Soft benign +bs No edema     Data Reviewed: I have personally reviewed following labs and imaging studies  CBC: Recent Labs  Lab 12/30/20 0433 12/31/20 0557  WBC 10.3 11.0*  NEUTROABS 7.3 8.0*  HGB 12.5* 13.5  HCT 38.2* 40.2  MCV 90.3 89.3  PLT 472* 427*   Basic Metabolic Panel: Recent Labs  Lab 12/30/20 0433 12/31/20 0557 01/02/21 0531 01/03/21 0358 01/04/21 0544  NA 138 134* 135 134*  --   K 3.9 3.7 4.7 4.6  --   CL 105 100 104 100  --   CO2 26 24 28 28   --   GLUCOSE 71 81 102* 117*  --   BUN 24* 24* 26* 30*  --   CREATININE 0.86 0.89 0.85 0.89 1.00  CALCIUM 8.5* 8.7* 8.5* 8.4*  --   MG 1.9 2.0 2.0 1.9  --   PHOS 3.1 2.9  --  3.9  --    GFR: Estimated Creatinine Clearance: 53.9 mL/min (by C-G formula based on SCr of 1  mg/dL). Liver Function Tests: Recent Labs  Lab 12/30/20 0433 12/31/20 0557  AST 13* 23  ALT 11 15  ALKPHOS 116 114  BILITOT 0.7 0.7  PROT 6.3* 6.4*  ALBUMIN 2.7* 2.7*   No results for input(s): LIPASE, AMYLASE in the last 168 hours. Recent Labs  Lab 01/02/21 0531  AMMONIA 16   Coagulation Profile: No results for input(s): INR, PROTIME in the last 168 hours. Cardiac Enzymes: No results for input(s): CKTOTAL, CKMB, CKMBINDEX, TROPONINI in the last 168 hours. BNP (last 3 results) No results for input(s): PROBNP in the last 8760 hours. HbA1C: No results for input(s): HGBA1C in the last 72 hours. CBG: No results for input(s): GLUCAP in the last 168 hours. Lipid Profile: No results for input(s): CHOL, HDL, LDLCALC, TRIG, CHOLHDL, LDLDIRECT in the last 72 hours. Thyroid Function Tests: No results for input(s): TSH, T4TOTAL, FREET4, T3FREE, THYROIDAB in the last 72 hours. Anemia Panel: No results for input(s): VITAMINB12, FOLATE, FERRITIN, TIBC, IRON, RETICCTPCT in the last 72 hours. Sepsis Labs: Recent Labs  Lab 12/30/20 0433  PROCALCITON <0.10    Recent Results (from the past 240 hour(s))  Blood culture (single)     Status: None   Collection Time: 12/28/20  1:44 PM   Specimen: BLOOD  Result Value Ref Range Status   Specimen Description BLOOD BLOOD LEFT FOREARM  Final   Special Requests   Final    BOTTLES DRAWN AEROBIC AND ANAEROBIC Blood Culture adequate volume   Culture   Final    NO GROWTH 5 DAYS Performed at [facility], [address] [place], [place]    Report Status 01/02/2021 FINAL  Final  Resp Panel by RT-PCR (Flu A&B, Covid) Nasopharyngeal Swab     Status: Abnormal   Collection Time: 12/28/20  1:44 PM   Specimen: Nasopharyngeal Swab; Nasopharyngeal(NP) swabs in vial transport medium  Result Value Ref Range Status   SARS Coronavirus 2 by RT PCR POSITIVE (A) NEGATIVE Final    Comment: RESULT CALLED TO, READ BACK BY AND VERIFIED  WITH: MARY FOUNKE AT 1714 ON 12/28/20 BY SS (NOTE) SARS-CoV-2 target nucleic acids are DETECTED.  The SARS-CoV-2 RNA is generally detectable in upper respiratory specimens during the acute phase of infection. Positive results are indicative of the presence of the identified virus, but do not rule out bacterial infection or co-infection with other pathogens not detected by the test. Clinical correlation with patient history and other diagnostic information is necessary to determine patient infection status. The expected result is Negative.  Fact Sheet for Patients: 12/30/20  Fact Sheet for  Healthcare Providers: SeriousBroker.it  This test is not yet approved or cleared by the [place] and  has been authorized for detection and/or diagnosis of SARS-CoV-2 by FDA under an Emergency Use Authorization (EUA).  This EUA will remain in effect (meaning this test can  be used) for the duration of  the COVID-19 declaration under Section 564(b)(1) of the Act, 21 U.S.C. section 360bbb-3(b)(1), unless the authorization is terminated or revoked sooner.     Influenza A by PCR NEGATIVE NEGATIVE Final   Influenza B by PCR NEGATIVE NEGATIVE Final    Comment: (NOTE) The Xpert Xpress SARS-CoV-2/FLU/RSV plus assay is intended as an aid in the diagnosis of influenza from Nasopharyngeal swab specimens and should not be used as a sole basis for treatment. Nasal washings and aspirates are unacceptable for Xpert Xpress SARS-CoV-2/FLU/RSV testing.  Fact Sheet for Patients: BloggerCourse.com  Fact Sheet for Healthcare Providers: SeriousBroker.it  This test is not yet approved or cleared by the [place] FDA and has been authorized for detection and/or diagnosis of SARS-CoV-2 by FDA under an Emergency Use Authorization (EUA). This EUA will remain in effect (meaning this test can be  used) for the duration of the COVID-19 declaration under Section 564(b)(1) of the Act, 21 U.S.C. section 360bbb-3(b)(1), unless the authorization is terminated or revoked.  Performed at [facility], [address] [place], [place] [address]   Culture, blood (single)     Status: None   Collection Time: 12/28/20  5:36 PM   Specimen: BLOOD  Result Value Ref Range Status   Specimen Description BLOOD RIGHT ANTECUBITAL  Final   Special Requests   Final    BOTTLES DRAWN AEROBIC AND ANAEROBIC Blood Culture results may not be optimal due to an excessive volume of blood received in culture bottles   Culture   Final    NO GROWTH 5 DAYS Performed at [facility], [address] [place], [place] [address]    Report Status 01/02/2021 FINAL  Final         Radiology Studies: No results found.      Scheduled Meds:  albuterol  2 puff Inhalation Q6H   amitriptyline  300 mg Oral QHS   enoxaparin (LOVENOX) injection  40 mg Subcutaneous Q24H   feeding supplement  237 mL Oral TID BM   folic acid  1 mg Oral Daily   lipase/protease/amylase  12,000 Units Oral TID WC   multivitamin with minerals  1 tablet Oral Daily   nicotine  21 mg Transdermal Daily   senna-docusate  2 tablet Oral BID   thiamine  100 mg Oral Daily   Or   thiamine  100 mg Intravenous Daily   Continuous Infusions:   LOS: 7 days    Time spent: 35 minutes with more than 50% on COC    Lynn Ito, MD [facility]   To contact the attending provider between 7A-7P or the covering provider during after hours 7P-7A, please log into the web site [email or web address] and access using universal [place] password for that web site. If you do not have the password, please call the hospital operator.  01/05/2021, 8:13 AM

## 2021-01-05 NOTE — TOC Progression Note (Addendum)
Transition of Care [facility]) - Progression Note    Patient Details  Name: Charles Ray MRN: [id number] Date of Birth: [birth year]  [facility]) CM/SW Contact  Margarito Liner, LCSW Phone Number: 01/05/2021, 8:48 AM  Clinical Narrative:   No bed offers so far. Sent updated therapy notes to SNF's that had not responded to referral yet and extended search to surrounding counties.  1:02 pm: Called son and provided bed offers. He asked about potential placement in [facility]). There are 5 facilities within 25 miles of [facility]. Left voicemails at [facility] and [facility]. Unable to get through on line to [facility]. According to [facility], does not seem like they still have a facility in [place]. Will fax referral to [facility] and [facility] today. If unable to get placement in this area, he said he would probably go with [facility]. [place] [facility] asking if patient still on CIWA protocol. Asked MD to document in her note if he is not.  2:33 pm: Uploaded MD progress note into [place] [facility] for PASARR review.  3:53 pm: PASARR obtained: [id number]. [facility] in [place] is able to offer a bed and can accept him on Friday. No calls back from any of the other facilities. Tried calling son with update but no answer, unable to leave voicemail.  4:31 pm: Received text message from son. Texted him back with bed offer information.  Expected Discharge Plan: Skilled Nursing Facility Barriers to Discharge: Continued Medical Work up  Expected Discharge Plan and Services Expected Discharge Plan: Skilled Nursing Facility   Discharge Planning Services: CM Consult Post Acute Care Choice: Skilled Nursing Facility Living arrangements for the past 2 months: Single Family Home                 DME Arranged: N/A DME Agency: NA       HH Arranged: NA HH Agency: NA         Social Determinants of Health (SDOH) Interventions     Readmission Risk Interventions No flowsheet data found.

## 2021-01-06 DIAGNOSIS — R627 Adult failure to thrive: Secondary | ICD-10-CM | POA: Diagnosis not present

## 2021-01-06 DIAGNOSIS — F1010 Alcohol abuse, uncomplicated: Secondary | ICD-10-CM | POA: Diagnosis not present

## 2021-01-06 DIAGNOSIS — U071 COVID-19: Secondary | ICD-10-CM | POA: Diagnosis not present

## 2021-01-06 DIAGNOSIS — B1920 Unspecified viral hepatitis C without hepatic coma: Secondary | ICD-10-CM | POA: Diagnosis not present

## 2021-01-06 MED ORDER — HYDROCORTISONE 1 % EX CREA
TOPICAL_CREAM | Freq: Three times a day (TID) | CUTANEOUS | Status: DC | PRN
  Filled 2021-01-06: qty 28

## 2021-01-06 MED ORDER — MIDODRINE HCL 5 MG PO TABS
2.5000 mg | ORAL_TABLET | Freq: Three times a day (TID) | ORAL | Status: DC
  Administered 2021-01-06 – 2021-01-08 (×7): 2.5 mg via ORAL
  Filled 2021-01-06 (×7): qty 1

## 2021-01-06 NOTE — Progress Notes (Signed)
Speech Language Pathology Treatment: Dysphagia  Patient Details Name: Charles Ray MRN: [id number] DOB: 14-Apr-1950 Today's Date: 01/06/2021 Time: 1829-9371 SLP Time Calculation (min) (ACUTE ONLY): 15 min  Assessment / Plan / Recommendation Clinical Impression  Pt in COVID isolation room. Eating Dys 3 diet with thin liquids. Has upper and lower dentures in for meal. Tolerated soft solids and thin liquids without s/s of aspiration. Oral phase adequate for current diet. Vocal quality remained clear.Rec mechanical soft diet at discharge. Please reconsult if any further concerns. Nsg in room helping Pt to bathroom as St left the room.   HPI HPI: Per admitting H&P "Charles Ray is a 70 y.o. male with medical history significant of previous hip fracture in 06/2020, alcohol use, tobacco use, patient is currently living with his son in a motel.  He was previously living with his wife until a week ago.  Review of records indicate that patient has been having some issues with his memory as well as having significant weight loss.  He reports that for the past week he has had decreased p.o. intake and lack of appetite.  He has had a cough which has been nonproductive.  Denies any fever or shortness of breath.  Denies any dysuria.  No nausea or vomiting.  He has been increasingly weak.  Records indicate that family has had difficulty getting him up.  He does have some chronic issues with hip pain from his surgery earlier this year.  It has been even harder to get him up and moving now."      SLP Plan  Discharge SLP treatment due to (comment);Other (Comment) (No further needs)      Recommendations for follow up therapy are one component of a multi-disciplinary discharge planning process, led by the attending physician.  Recommendations may be updated based on patient status, additional functional criteria and insurance authorization.    Recommendations  Diet recommendations: Dysphagia 3 (mechanical  soft);Thin liquid Liquids provided via: Cup Medication Administration: Whole meds with liquid Compensations: Slow rate;Small sips/bites Postural Changes and/or Swallow Maneuvers: Seated upright 90 degrees;Upright 30-60 min after meal                General recommendations: Other(comment) (Going to skilled Nsg) Follow up Recommendations: Skilled Nursing facility SLP Visit Diagnosis: Dysphagia, oropharyngeal phase (R13.12) Plan: Discharge SLP treatment due to (comment);Other (Comment) (No further needs)       GO                Eather Colas  01/06/2021, 12:40 PM

## 2021-01-06 NOTE — TOC Progression Note (Addendum)
Transition of Care [facility]) - Progression Note    Patient Details  Name: Charles Ray MRN: [id number] Date of Birth: 04-Oct-1950  Transition of Care [facility]) CM/SW Contact  Margarito Liner, LCSW Phone Number: 01/06/2021, 10:46 AM  Clinical Narrative:   Faxed referral to [facility] SNF. Left message for admissions coordinator at [facility] to check status of referral. Unable to reach anyone at [facility].  10:49 am: Per son, patient seemed hesitant about going to [facility] at this time so he thinks [facility] makes most sense at this time. Left voicemail for admissions coordinator to notify. Tomorrow will be day 10 from positive COVID test. Asked that she let me know if he could discharge tomorrow or Friday.  Expected Discharge Plan: Skilled Nursing Facility Barriers to Discharge: Continued Medical Work up  Expected Discharge Plan and Services Expected Discharge Plan: Skilled Nursing Facility   Discharge Planning Services: CM Consult Post Acute Care Choice: Skilled Nursing Facility Living arrangements for the past 2 months: Single Family Home                 DME Arranged: N/A DME Agency: NA       HH Arranged: NA HH Agency: NA         Social Determinants of Health (SDOH) Interventions    Readmission Risk Interventions No flowsheet data found.

## 2021-01-06 NOTE — Progress Notes (Signed)
PROGRESS NOTE    Charles Ray  DGU:[id number] DOB: 16-Feb-1951 DOA: 12/28/2020 PCP: Jerl Mina, MD   Chief complaint.  Weakness. Brief Narrative:   70 year old man with history of alcohol abuse tobacco abuse previous history of fracture back in 06/2020.  He has been having some issues with memory and weight loss.  Brought in the hospital with weakness and cough and found to have pneumonia and COVID-19 infection.  Admitted 12/28/2020. Patient is treated with remdesivir.  Not developed hypoxemia.  Patient was also has significant weakness, seen by PT, recommended nursing placement. Patient is also evaluated by speech therapy, deemed to be high risk with aspiration.  Placed on nectar thick liquid.  Also treated with Augmentin for aspiration pneumonia. Patient condition had improved, appetite is also improved.  Currently pending for nursing home placement. 9/28 no overnight issues  Assessment & Plan:   Active Problems:   Hepatitis C   Tobacco abuse   Alcohol abuse   Pneumonia due to COVID-19 virus   Weakness   Failure to thrive in adult   Severe protein-calorie malnutrition (HCC)   Reactive thrombocytosis   Hyponatremia  Left lower lobe aspiration pneumonia. COVID-19 pneumonia. Improved On a 10-day quarantine prior to being discharged to SNF   Failure to thrive. Anorexia. Severe protein calorie malnutrition. Generalized weakness Pancreatic insufficiency. Acute metabolic encephalopathy. Appetite improved  Diarrhea resolved  On creon 9/28 currently pending nursing home placement.  Will need 10-day quarantine before being accepted, should and on 9/30      Alcohol abuse with alcohol dementia. MS improved No evidence of withdrawal.  Not on CIWA protocol Continue thiamine    DVT prophylaxis: Lovenox Code Status: full Family Communication: Son at bedside. Disposition Plan:      Status is: Inpatient     Remains inpatient appropriate because:Unsafe d/c plan and  Inpatient level of care appropriate due to severity of illness   Dispo: The patient is from: Home              Anticipated d/c is to: SNF              Patient currently is medically stable to d/c.              Difficult to place patient No  Needs total of 10-day quarantine prior to going to SNF     I/O last 3 completed shifts: In: -  Out: 1320 [Urine:1320] No intake/output data recorded.     Consultants:  None  Procedures: None  Antimicrobials: None  Subjective: Denies diarrhea, chest pain, shortness of breath  Objective: Vitals:   01/05/21 1100 01/05/21 1542 01/05/21 2029 01/06/21 0511  BP: 108/67 100/64 103/68 100/62  Pulse: 96 91 99 85  Resp: 16 16 16 17   Temp: 97.8 F (36.6 C) 98 F (36.7 C) 98.7 F (37.1 C) 97.6 F (36.4 C)  TempSrc:   Oral Oral  SpO2: 95% 97% 93% 94%  Weight:      Height:        Intake/Output Summary (Last 24 hours) at 01/06/2021 0830 Last data filed at 01/05/2021 1500 Gross per 24 hour  Intake --  Output 1320 ml  Net -1320 ml   Filed Weights   12/28/20 1042 01/02/21 0500 01/03/21 0417  Weight: 70.3 kg 57.2 kg 57.1 kg    Examination: Calm, pleasant CTA no wheeze Regular S1-S2 no gallops Soft benign positive bowel sounds No edema     Data Reviewed: I have personally reviewed following labs and  imaging studies  CBC: Recent Labs  Lab 12/31/20 0557  WBC 11.0*  NEUTROABS 8.0*  HGB 13.5  HCT 40.2  MCV 89.3  PLT 427*   Basic Metabolic Panel: Recent Labs  Lab 12/31/20 0557 01/02/21 0531 01/03/21 0358 01/04/21 0544  NA 134* 135 134*  --   K 3.7 4.7 4.6  --   CL 100 104 100  --   CO2 24 28 28   --   GLUCOSE 81 102* 117*  --   BUN 24* 26* 30*  --   CREATININE 0.89 0.85 0.89 1.00  CALCIUM 8.7* 8.5* 8.4*  --   MG 2.0 2.0 1.9  --   PHOS 2.9  --  3.9  --    GFR: Estimated Creatinine Clearance: 53.9 mL/min (by C-G formula based on SCr of 1 mg/dL). Liver Function Tests: Recent Labs  Lab 12/31/20 0557  AST 23   ALT 15  ALKPHOS 114  BILITOT 0.7  PROT 6.4*  ALBUMIN 2.7*   No results for input(s): LIPASE, AMYLASE in the last 168 hours. Recent Labs  Lab 01/02/21 0531  AMMONIA 16   Coagulation Profile: No results for input(s): INR, PROTIME in the last 168 hours. Cardiac Enzymes: No results for input(s): CKTOTAL, CKMB, CKMBINDEX, TROPONINI in the last 168 hours. BNP (last 3 results) No results for input(s): PROBNP in the last 8760 hours. HbA1C: No results for input(s): HGBA1C in the last 72 hours. CBG: No results for input(s): GLUCAP in the last 168 hours. Lipid Profile: No results for input(s): CHOL, HDL, LDLCALC, TRIG, CHOLHDL, LDLDIRECT in the last 72 hours. Thyroid Function Tests: No results for input(s): TSH, T4TOTAL, FREET4, T3FREE, THYROIDAB in the last 72 hours. Anemia Panel: No results for input(s): VITAMINB12, FOLATE, FERRITIN, TIBC, IRON, RETICCTPCT in the last 72 hours. Sepsis Labs: No results for input(s): PROCALCITON, LATICACIDVEN in the last 168 hours.   Recent Results (from the past 240 hour(s))  Blood culture (single)     Status: None   Collection Time: 12/28/20  1:44 PM   Specimen: BLOOD  Result Value Ref Range Status   Specimen Description BLOOD BLOOD LEFT FOREARM  Final   Special Requests   Final    BOTTLES DRAWN AEROBIC AND ANAEROBIC Blood Culture adequate volume   Culture   Final    NO GROWTH 5 DAYS Performed at [facility], [address] [place], [place]    Report Status 01/02/2021 FINAL  Final  Resp Panel by RT-PCR (Flu A&B, Covid) Nasopharyngeal Swab     Status: Abnormal   Collection Time: 12/28/20  1:44 PM   Specimen: Nasopharyngeal Swab; Nasopharyngeal(NP) swabs in vial transport medium  Result Value Ref Range Status   SARS Coronavirus 2 by RT PCR POSITIVE (A) NEGATIVE Final    Comment: RESULT CALLED TO, READ BACK BY AND VERIFIED WITH: MARY FOUNKE AT 1714 ON 12/28/20 BY SS (NOTE) SARS-CoV-2 target nucleic acids are  DETECTED.  The SARS-CoV-2 RNA is generally detectable in upper respiratory specimens during the acute phase of infection. Positive results are indicative of the presence of the identified virus, but do not rule out bacterial infection or co-infection with other pathogens not detected by the test. Clinical correlation with patient history and other diagnostic information is necessary to determine patient infection status. The expected result is Negative.  Fact Sheet for Patients: 12/30/20  Fact Sheet for Healthcare Providers: BloggerCourse.com  This test is not yet approved or cleared by the [place] FDA and  has  been authorized for detection and/or diagnosis of SARS-CoV-2 by FDA under an Emergency Use Authorization (EUA).  This EUA will remain in effect (meaning this test can  be used) for the duration of  the COVID-19 declaration under Section 564(b)(1) of the Act, 21 U.S.C. section 360bbb-3(b)(1), unless the authorization is terminated or revoked sooner.     Influenza A by PCR NEGATIVE NEGATIVE Final   Influenza B by PCR NEGATIVE NEGATIVE Final    Comment: (NOTE) The Xpert Xpress SARS-CoV-2/FLU/RSV plus assay is intended as an aid in the diagnosis of influenza from Nasopharyngeal swab specimens and should not be used as a sole basis for treatment. Nasal washings and aspirates are unacceptable for Xpert Xpress SARS-CoV-2/FLU/RSV testing.  Fact Sheet for Patients: BloggerCourse.com  Fact Sheet for Healthcare Providers: SeriousBroker.it  This test is not yet approved or cleared by the [place] FDA and has been authorized for detection and/or diagnosis of SARS-CoV-2 by FDA under an Emergency Use Authorization (EUA). This EUA will remain in effect (meaning this test can be used) for the duration of the COVID-19 declaration under Section 564(b)(1) of the Act, 21  U.S.C. section 360bbb-3(b)(1), unless the authorization is terminated or revoked.  Performed at [facility], [address] [place], [place] [address]   Culture, blood (single)     Status: None   Collection Time: 12/28/20  5:36 PM   Specimen: BLOOD  Result Value Ref Range Status   Specimen Description BLOOD RIGHT ANTECUBITAL  Final   Special Requests   Final    BOTTLES DRAWN AEROBIC AND ANAEROBIC Blood Culture results may not be optimal due to an excessive volume of blood received in culture bottles   Culture   Final    NO GROWTH 5 DAYS Performed at [facility], [address] [place], [place] [address]    Report Status 01/02/2021 FINAL  Final         Radiology Studies: No results found.      Scheduled Meds:  albuterol  2 puff Inhalation Q6H   amitriptyline  300 mg Oral QHS   enoxaparin (LOVENOX) injection  40 mg Subcutaneous Q24H   feeding supplement  237 mL Oral TID BM   folic acid  1 mg Oral Daily   lipase/protease/amylase  12,000 Units Oral TID WC   multivitamin with minerals  1 tablet Oral Daily   nicotine  21 mg Transdermal Daily   senna-docusate  2 tablet Oral BID   thiamine  100 mg Oral Daily   Or   thiamine  100 mg Intravenous Daily   Continuous Infusions:   LOS: 8 days    Time spent: 35 minutes with more than 50% on COC    Lynn Ito, MD [facility]   To contact the attending provider between 7A-7P or the covering provider during after hours 7P-7A, please log into the web site [email or web address] and access using universal [place] password for that web site. If you do not have the password, please call the hospital operator.  01/06/2021, 8:30 AM

## 2021-01-06 NOTE — Progress Notes (Signed)
Nutrition Follow-up  DOCUMENTATION CODES:  Severe malnutrition in context of chronic illness, Underweight  INTERVENTION:  Continue diet as ordered per SLP.  Continue Ensure TID.  Continue Magic Cup TID.  Continue CIWA protocol.  Encourage PO intake.  Recommend feeding assistance with meals.  NUTRITION DIAGNOSIS:  Severe Malnutrition related to chronic illness (EtOH abuse) as evidenced by severe fat depletion, severe muscle depletion. - ongoing  GOAL:  Patient will meet greater than or equal to 90% of their needs - not meeting  MONITOR:  PO intake, Supplement acceptance, Labs, Weight trends, I & O's  REASON FOR ASSESSMENT:  Malnutrition Screening Tool    ASSESSMENT:  70 yo male with a PMH of alcohol abuse tobacco abuse previous history of fracture back in 06/2020. He has been having some issues with memory and weight loss. Brought in the hospital with weakness and cough and found to have pneumonia and COVID-19 infection. 9/19 - regular diet 9/22 - Dysphagia 2 with nectar thick liquids 9/26 - Dysphagia 3 with thin liquids  Patient condition had improved. Currently pending for nursing home placement.  Per Epic, pt consuming ~42% over last 3 documented meals. Documentation is limited. Pt continues to drink Ensure supplements.  Continue current nutrition plan with added feeding assistance from RN/NT with meals.  Supplements: Ensure TID  Medications: reviewed; folic acid, Creon TID, midodrine TID, MVI with minerals, Senokot BID, thiamine  Labs: reviewed; Na 134 (L), Glucose 117 (H), BUN 30 (H - trending up)  Diet Order:   Diet Order             DIET DYS 3 Room service appropriate? Yes; Fluid consistency: Thin  Diet effective 1400                  EDUCATION NEEDS:  Not appropriate for education at this time  Skin:  Skin Assessment: Reviewed RN Assessment  Last BM:  01/05/21 - x2, Type 5, large  Height:  Ht Readings from Last 1 Encounters:  12/28/20 6\' 5"   (1.956 m)   Weight:  Wt Readings from Last 1 Encounters:  01/03/21 57.1 kg   BMI:  Body mass index is 14.93 kg/m.  Estimated Nutritional Needs:  Kcal:  2100-2300 Protein:  95-110 grams Fluid:  >2.1 L  01/05/21, RD, LDN (she/her/hers) Registered Dietitian I After-Hours/Weekend Pager # in [place]

## 2021-01-06 NOTE — Progress Notes (Signed)
Physical Therapy Treatment Patient Details Name: Charles Ray MRN: [id number] DOB: [birth year] Today's Date: 01/06/2021   History of Present Illness 70 y.o. male with medical history significant of previous hip fracture in 06/2020 (s/p L THA anterior approach 06/19/20), alcohol use, tobacco use, patient is currently living with his son in a motel.  He was previously living with his wife until a week ago.  Review of records indicate that patient has been having some issues with his memory as well as having significant weight loss.  He reports that for the past week he has had decreased p.o. intake and lack of appetite.  He has had a cough which has been nonproductive.  Denies any fever or shortness of breath.  Denies any dysuria.  No nausea or vomiting.  He has been increasingly weak.  Records indicate that family has had difficulty getting him up.  He does have some chronic issues with hip pain from his surgery earlier this year.  It has been even harder to get him up and moving now.    PT Comments    Pt was asleep upon arriving. He agrees to session and is cooperative and motivated throughout. Does present with flat affect but was pleasant. He was able to exit L side of bed with increased time. Sat EOB x several minutes prior to standing. Requires min-mod assist of one from lowest bed height and from recliner. Attempted to ambulate with +1 UE support(HHA) however pt is extremely unsteady. Used RW to ambulate 60 ft with CGA only. Pt has poor carryover between cues/gait trials. Constant vcs for posture correction and staying inside RW. Occasional scissoring during turns. Overall limited by fatigue. Acute PT will continue to follow and progress as able per current POC. SNF seems most appropriate.    Recommendations for follow up therapy are one component of a multi-disciplinary discharge planning process, led by the attending physician.  Recommendations may be updated based on patient status, additional  functional criteria and insurance authorization.  Follow Up Recommendations  SNF     Equipment Recommendations   (defer to next level of care)       Precautions / Restrictions Precautions Precautions: Fall Restrictions Weight Bearing Restrictions: No     Mobility  Bed Mobility Overal bed mobility: Needs Assistance Bed Mobility: Supine to Sit     Supine to sit: Supervision (flat bed, increased time and use of bedrail)     General bed mobility comments: Pt was able to exit L side of bed with increased time. sat EOB for several minutes prior to standing.    Transfers Overall transfer level: Needs assistance Equipment used: Rolling walker (2 wheeled) Transfers: Sit to/from Stand Sit to Stand: Min assist;Mod assist         General transfer comment: lowest bed height and from recliner 1 x. vcs for improved technique and fwd wt shift. did fatigue quickly with minimal activity  Ambulation/Gait Ambulation/Gait assistance: Min guard Gait Distance (Feet): 60 Feet Assistive device: Rolling walker (2 wheeled) Gait Pattern/deviations: Trunk flexed;Drifts right/left;Scissoring (scissoring during turning) Gait velocity: decreased   General Gait Details: Pt tends to get outside RW (especially with turns) constant vcs for posture correction and attempting to navigate straight course     Balance Overall balance assessment: Needs assistance Sitting-balance support: No upper extremity supported;Feet supported Sitting balance-Leahy Scale: Good     Standing balance support: Single extremity supported Standing balance-Leahy Scale: Poor Standing balance comment: Attempted dynamic balance with single UE support however poor  dynamic balance with only single UE support. With BUE support, pt demonstrates fair balance. Continues to be overall fall risk mostly due to poor safety awareness       Cognition Arousal/Alertness: Awake/alert Behavior During Therapy: Flat affect Overall  Cognitive Status: History of cognitive impairments - at baseline      General Comments: Pt alert and oriented x 3. Agres to session and is cooperativeand motivated throughout.             Pertinent Vitals/Pain Pain Assessment: No/denies pain     PT Goals (current goals can now be found in the care plan section) Acute Rehab PT Goals Patient Stated Goal: rehab then home Progress towards PT goals: Progressing toward goals    Frequency    Min 2X/week      PT Plan Current plan remains appropriate       AM-PAC PT "6 Clicks" Mobility   Outcome Measure  Help needed turning from your back to your side while in a flat bed without using bedrails?: A Little Help needed moving from lying on your back to sitting on the side of a flat bed without using bedrails?: A Little Help needed moving to and from a bed to a chair (including a wheelchair)?: A Little Help needed standing up from a chair using your arms (e.g., wheelchair or bedside chair)?: A Little Help needed to walk in hospital room?: A Little Help needed climbing 3-5 steps with a railing? : A Lot 6 Click Score: 17    End of Session Equipment Utilized During Treatment: Gait belt Activity Tolerance: Patient limited by fatigue Patient left: in chair;with call bell/phone within reach;with chair alarm set Nurse Communication: Mobility status;Precautions PT Visit Diagnosis: Unsteadiness on feet (R26.81);Muscle weakness (generalized) (M62.81);History of falling (Z91.81);Other abnormalities of gait and mobility (R26.89)     Time: 1610-9604 PT Time Calculation (min) (ACUTE ONLY): 19 min  Charges:  $Gait Training: 8-22 mins                     Jetta Lout PTA 01/06/21, 5:02 PM

## 2021-01-07 DIAGNOSIS — E871 Hypo-osmolality and hyponatremia: Secondary | ICD-10-CM | POA: Diagnosis not present

## 2021-01-07 DIAGNOSIS — F1010 Alcohol abuse, uncomplicated: Secondary | ICD-10-CM | POA: Diagnosis not present

## 2021-01-07 DIAGNOSIS — R627 Adult failure to thrive: Secondary | ICD-10-CM | POA: Diagnosis not present

## 2021-01-07 DIAGNOSIS — U071 COVID-19: Secondary | ICD-10-CM | POA: Diagnosis not present

## 2021-01-07 NOTE — Care Management Important Message (Signed)
Important Message  Patient Details  Name: Charles Ray MRN: [id number] Date of Birth: 07-18-1950   Medicare Important Message Given:  Yes  Reviewed Medicare IM with son, Marquies, [place]. At [phone].  Aware of right to appeal discharge through Medicare.  Declined copy of Medicare IM at this time.     Johnell Comings 01/07/2021, 12:54 PM

## 2021-01-07 NOTE — TOC Progression Note (Signed)
Transition of Care [facility]) - Progression Note    Patient Details  Name: Charles Ray MRN: [id number] Date of Birth: 04-Jun-1950  Transition of Care [facility] CM/SW Contact  Allayne Butcher, RN Phone Number: 01/07/2021, 2:01 PM  Clinical Narrative:    Insurance authorization approved Berkley Harvey ID [id number] Reference [id number] Approved 9/30-10/4 Plan for DC tomorrow to [facility].   Expected Discharge Plan: Skilled Nursing Facility Barriers to Discharge: Continued Medical Work up  Expected Discharge Plan and Services Expected Discharge Plan: Skilled Nursing Facility   Discharge Planning Services: CM Consult Post Acute Care Choice: Skilled Nursing Facility Living arrangements for the past 2 months: Single Family Home                 DME Arranged: N/A DME Agency: NA       HH Arranged: NA HH Agency: NA         Social Determinants of Health (SDOH) Interventions    Readmission Risk Interventions No flowsheet data found.

## 2021-01-07 NOTE — Progress Notes (Signed)
Physical Therapy Treatment Patient Details Name: Charles Ray MRN: [id number] DOB: [birth year] Today's Date: 01/07/2021   History of Present Illness 70 y.o. male with medical history significant of previous hip fracture in 06/2020 (s/p L THA anterior approach 06/19/20), alcohol use, tobacco use, patient is currently living with his son in a motel.  He was previously living with his wife until a week ago.  Review of records indicate that patient has been having some issues with his memory as well as having significant weight loss.  He reports that for the past week he has had decreased p.o. intake and lack of appetite.  He has had a cough which has been nonproductive.  Denies any fever or shortness of breath.  Denies any dysuria.  No nausea or vomiting.  He has been increasingly weak.  Records indicate that family has had difficulty getting him up.  He does have some chronic issues with hip pain from his surgery earlier this year.  It has been even harder to get him up and moving now.    PT Comments    Pt was long sitting in bed upon arriving. He is alert however does present with cognition deficits. He was able to exit L side of bed with increased time and vcs however no physical assistance. Sat EOB x several minutes prior to ambulation 20 ft with +1 HHA. Does require mod assist without BUE support during dynamic task. Highly recommend use of RW at all times when up on his feet. He was sitting in recliner post session with call bell in reach and chair alarm in place. Highly recommend DC to SNF to address deficits while maximizing independence with ADLs.    Recommendations for follow up therapy are one component of a multi-disciplinary discharge planning process, led by the attending physician.  Recommendations may be updated based on patient status, additional functional criteria and insurance authorization.  Follow Up Recommendations  SNF     Equipment Recommendations  Other (comment) (defer to  next level of care)       Precautions / Restrictions Precautions Precautions: Fall Restrictions Weight Bearing Restrictions: No     Mobility  Bed Mobility Overal bed mobility: Needs Assistance Bed Mobility: Supine to Sit     Supine to sit: Supervision (dioes require vcs due to cognition for improved technique and sequencing.)     General bed mobility comments: pt was able to exit L side of bed with increased time and vcs for improved technique    Transfers Overall transfer level: Needs assistance Equipment used: 1 person hand held assist;Rolling walker (2 wheeled) Transfers: Sit to/from Stand Sit to Stand: Min assist         General transfer comment: min assist for safety and to achieve standing from lowest ebd height. stood 1 x without AD +1 UE support with poor technique. stood 1 x to RW with much improved safety  Ambulation/Gait Ambulation/Gait assistance: Min guard;Mod assist Gait Distance (Feet): 20 Feet Assistive device: Rolling walker (2 wheeled);1 person hand held assist Gait Pattern/deviations: Step-through pattern Gait velocity: decreased   General Gait Details: Pt was able to ambulate with +1 UE support x 20 ft with mod assist. pt is currently unsafe to ambulate without AD. Highly recommend use of RW at all times     Balance Overall balance assessment: Needs assistance Sitting-balance support: No upper extremity supported;Feet supported Sitting balance-Leahy Scale: Good Sitting balance - Comments: steady sitting reaching within BOS   Standing balance support: Single extremity  supported Standing balance-Leahy Scale: Poor Standing balance comment: high fall risk without BUE support on RW      Cognition Arousal/Alertness: Awake/alert Behavior During Therapy: Flat affect Overall Cognitive Status: History of cognitive impairments - at baseline      General Comments: poor safety awareness and insight of deficits             Pertinent Vitals/Pain  Pain Assessment: No/denies pain     PT Goals (current goals can now be found in the care plan section) Acute Rehab PT Goals Patient Stated Goal: rehab then home Progress towards PT goals: Progressing toward goals    Frequency    Min 2X/week      PT Plan Current plan remains appropriate       AM-PAC PT "6 Clicks" Mobility   Outcome Measure  Help needed turning from your back to your side while in a flat bed without using bedrails?: A Little Help needed moving from lying on your back to sitting on the side of a flat bed without using bedrails?: A Little Help needed moving to and from a bed to a chair (including a wheelchair)?: A Little Help needed standing up from a chair using your arms (e.g., wheelchair or bedside chair)?: A Little Help needed to walk in hospital room?: A Little Help needed climbing 3-5 steps with a railing? : A Lot 6 Click Score: 17    End of Session Equipment Utilized During Treatment: Gait belt Activity Tolerance: Patient limited by fatigue Patient left: in chair;with call bell/phone within reach;with chair alarm set Nurse Communication: Mobility status;Precautions PT Visit Diagnosis: Unsteadiness on feet (R26.81);Muscle weakness (generalized) (M62.81);History of falling (Z91.81);Other abnormalities of gait and mobility (R26.89)     Time: 8563-1497 PT Time Calculation (min) (ACUTE ONLY): 22 min  Charges:  $Gait Training: 8-22 mins                     Jetta Lout PTA 01/07/21, 3:59 PM

## 2021-01-07 NOTE — TOC Progression Note (Signed)
Transition of Care [facility]) - Progression Note    Patient Details  Name: Charles Ray MRN: [id number] Date of Birth: 1950/10/20  [facility] CM/SW Contact  Allayne Butcher, RN Phone Number: 01/07/2021, 10:31 AM  Clinical Narrative:    Plan for discharge to [facility] tomorrow.  [facility] authorization started via online portal.   Expected Discharge Plan: Skilled Nursing Facility Barriers to Discharge: Continued Medical Work up  Expected Discharge Plan and Services Expected Discharge Plan: Skilled Nursing Facility   Discharge Planning Services: CM Consult Post Acute Care Choice: Skilled Nursing Facility Living arrangements for the past 2 months: Single Family Home                 DME Arranged: N/A DME Agency: NA       HH Arranged: NA HH Agency: NA         Social Determinants of Health (SDOH) Interventions    Readmission Risk Interventions No flowsheet data found.

## 2021-01-07 NOTE — Progress Notes (Signed)
PROGRESS NOTE    Charles HAKEEM  Ray:[id number] DOB: [birth year] DOA: 12/28/2020 PCP: Jerl Mina, MD   Chief complaint.  Weakness. Brief Narrative:   70 year old man with history of alcohol abuse tobacco abuse previous history of fracture back in 06/2020.  He has been having some issues with memory and weight loss.  Brought in the hospital with weakness and cough and found to have pneumonia and COVID-19 infection.  Admitted 12/28/2020. Patient is treated with remdesivir.  Not developed hypoxemia.  Patient was also has significant weakness, seen by PT, recommended nursing placement. Patient is also evaluated by speech therapy, deemed to be high risk with aspiration.  Placed on nectar thick liquid.  Also treated with Augmentin for aspiration pneumonia. Patient condition had improved, appetite is also improved.  Currently pending for nursing home placement. 9/28 no overnight issues 9/29- no overnight issues.   Assessment & Plan:   Active Problems:   Hepatitis C   Tobacco abuse   Alcohol abuse   Pneumonia due to COVID-19 virus   Weakness   Failure to thrive in adult   Severe protein-calorie malnutrition (HCC)   Reactive thrombocytosis   Hyponatremia  Left lower lobe aspiration pneumonia. COVID-19 pneumonia. Improved On a 10-day quarantine prior to being discharged to SNF-last day tomorrow     Failure to thrive. Anorexia. Severe protein calorie malnutrition. Generalized weakness Pancreatic insufficiency. Acute metabolic encephalopathy. Appetite improved  Diarrhea resolved  On creon 9/29 currently pending nursing home placement.  On a 10-day quarantine before being accepted should end on 9/30      Alcohol abuse with alcohol dementia. MS improved  No evidence of withdrawal.  Not on CIWA protocol  Continue thiamine       DVT prophylaxis: Lovenox Code Status: full Family Communication: None at bedside Disposition Plan:      Status is: Inpatient      Remains inpatient appropriate because:Unsafe d/c plan and Inpatient level of care appropriate due to severity of illness   Dispo: The patient is from: Home              Anticipated d/c is to: SNF              Patient currently is medically stable to d/c.              Difficult to place patient No  Needs total of 10-day quarantine prior to going to SNF-DC in a.m.     I/O last 3 completed shifts: In: -  Out: 1625 [Urine:1625] Total I/O In: -  Out: 450 [Urine:450]     Consultants:  None  Procedures: None  Antimicrobials: None  Subjective: Has no complaints.  Denies shortness of breath, diarrhea, abdominal pain  Objective: Vitals:   01/07/21 0043 01/07/21 0405 01/07/21 0758 01/07/21 1224  BP: 112/70 101/62 113/61 103/65  Pulse: 81 75 77 82  Resp: 16 17 18 16   Temp: (!) 97.4 F (36.3 C) 98 F (36.7 C) (!) 97.5 F (36.4 C) (!) 97.5 F (36.4 C)  TempSrc: Oral   Oral  SpO2: 97% 96% 95% 98%  Weight:      Height:        Intake/Output Summary (Last 24 hours) at 01/07/2021 1254 Last data filed at 01/07/2021 0813 Gross per 24 hour  Intake --  Output 1575 ml  Net -1575 ml   Filed Weights   12/28/20 1042 01/02/21 0500 01/03/21 0417  Weight: 70.3 kg 57.2 kg 57.1 kg    Examination: Calm, NAD  CTA no wheeze Regular S1-S2 no gallops Soft benign positive bowel sounds No edema Grossly intact    Data Reviewed: I have personally reviewed following labs and imaging studies  CBC: No results for input(s): WBC, NEUTROABS, HGB, HCT, MCV, PLT in the last 168 hours.  Basic Metabolic Panel: Recent Labs  Lab 01/02/21 0531 01/03/21 0358 01/04/21 0544  NA 135 134*  --   K 4.7 4.6  --   CL 104 100  --   CO2 28 28  --   GLUCOSE 102* 117*  --   BUN 26* 30*  --   CREATININE 0.85 0.89 1.00  CALCIUM 8.5* 8.4*  --   MG 2.0 1.9  --   PHOS  --  3.9  --    GFR: Estimated Creatinine Clearance: 53.9 mL/min (by C-G formula based on SCr of 1 mg/dL). Liver Function  Tests: No results for input(s): AST, ALT, ALKPHOS, BILITOT, PROT, ALBUMIN in the last 168 hours.  No results for input(s): LIPASE, AMYLASE in the last 168 hours. Recent Labs  Lab 01/02/21 0531  AMMONIA 16   Coagulation Profile: No results for input(s): INR, PROTIME in the last 168 hours. Cardiac Enzymes: No results for input(s): CKTOTAL, CKMB, CKMBINDEX, TROPONINI in the last 168 hours. BNP (last 3 results) No results for input(s): PROBNP in the last 8760 hours. HbA1C: No results for input(s): HGBA1C in the last 72 hours. CBG: No results for input(s): GLUCAP in the last 168 hours. Lipid Profile: No results for input(s): CHOL, HDL, LDLCALC, TRIG, CHOLHDL, LDLDIRECT in the last 72 hours. Thyroid Function Tests: No results for input(s): TSH, T4TOTAL, FREET4, T3FREE, THYROIDAB in the last 72 hours. Anemia Panel: No results for input(s): VITAMINB12, FOLATE, FERRITIN, TIBC, IRON, RETICCTPCT in the last 72 hours. Sepsis Labs: No results for input(s): PROCALCITON, LATICACIDVEN in the last 168 hours.   Recent Results (from the past 240 hour(s))  Blood culture (single)     Status: None   Collection Time: 12/28/20  1:44 PM   Specimen: BLOOD  Result Value Ref Range Status   Specimen Description BLOOD BLOOD LEFT FOREARM  Final   Special Requests   Final    BOTTLES DRAWN AEROBIC AND ANAEROBIC Blood Culture adequate volume   Culture   Final    NO GROWTH 5 DAYS Performed at [facility], [address] [place], [place] [address]    Report Status 01/02/2021 FINAL  Final  Resp Panel by RT-PCR (Flu A&B, Covid) Nasopharyngeal Swab     Status: Abnormal   Collection Time: 12/28/20  1:44 PM   Specimen: Nasopharyngeal Swab; Nasopharyngeal(NP) swabs in vial transport medium  Result Value Ref Range Status   SARS Coronavirus 2 by RT PCR POSITIVE (A) NEGATIVE Final    Comment: RESULT CALLED TO, READ BACK BY AND VERIFIED WITH: MARY FOUNKE AT 1714 ON 12/28/20 BY SS (NOTE) SARS-CoV-2  target nucleic acids are DETECTED.  The SARS-CoV-2 RNA is generally detectable in upper respiratory specimens during the acute phase of infection. Positive results are indicative of the presence of the identified virus, but do not rule out bacterial infection or co-infection with other pathogens not detected by the test. Clinical correlation with patient history and other diagnostic information is necessary to determine patient infection status. The expected result is Negative.  Fact Sheet for Patients: BloggerCourse.com  Fact Sheet for Healthcare Providers: SeriousBroker.it  This test is not yet approved or cleared by the [place] FDA and  has been authorized for detection  and/or diagnosis of SARS-CoV-2 by FDA under an Emergency Use Authorization (EUA).  This EUA will remain in effect (meaning this test can  be used) for the duration of  the COVID-19 declaration under Section 564(b)(1) of the Act, 21 U.S.C. section 360bbb-3(b)(1), unless the authorization is terminated or revoked sooner.     Influenza A by PCR NEGATIVE NEGATIVE Final   Influenza B by PCR NEGATIVE NEGATIVE Final    Comment: (NOTE) The Xpert Xpress SARS-CoV-2/FLU/RSV plus assay is intended as an aid in the diagnosis of influenza from Nasopharyngeal swab specimens and should not be used as a sole basis for treatment. Nasal washings and aspirates are unacceptable for Xpert Xpress SARS-CoV-2/FLU/RSV testing.  Fact Sheet for Patients: BloggerCourse.com  Fact Sheet for Healthcare Providers: SeriousBroker.it  This test is not yet approved or cleared by the [place] FDA and has been authorized for detection and/or diagnosis of SARS-CoV-2 by FDA under an Emergency Use Authorization (EUA). This EUA will remain in effect (meaning this test can be used) for the duration of the COVID-19 declaration under Section  564(b)(1) of the Act, 21 U.S.C. section 360bbb-3(b)(1), unless the authorization is terminated or revoked.  Performed at [facility], [address] [place], [place] [address]   Culture, blood (single)     Status: None   Collection Time: 12/28/20  5:36 PM   Specimen: BLOOD  Result Value Ref Range Status   Specimen Description BLOOD RIGHT ANTECUBITAL  Final   Special Requests   Final    BOTTLES DRAWN AEROBIC AND ANAEROBIC Blood Culture results may not be optimal due to an excessive volume of blood received in culture bottles   Culture   Final    NO GROWTH 5 DAYS Performed at [facility], [address], [place], [place] [address]    Report Status 01/02/2021 FINAL  Final         Radiology Studies: No results found.      Scheduled Meds:  albuterol  2 puff Inhalation Q6H   amitriptyline  300 mg Oral QHS   enoxaparin (LOVENOX) injection  40 mg Subcutaneous Q24H   feeding supplement  237 mL Oral TID BM   folic acid  1 mg Oral Daily   lipase/protease/amylase  12,000 Units Oral TID WC   midodrine  2.5 mg Oral TID WC   multivitamin with minerals  1 tablet Oral Daily   nicotine  21 mg Transdermal Daily   senna-docusate  2 tablet Oral BID   thiamine  100 mg Oral Daily   Or   thiamine  100 mg Intravenous Daily   Continuous Infusions:   LOS: 9 days    Time spent: 35 minutes with more than 50% on COC    Lynn Ito, MD [facility]   To contact the attending provider between 7A-7P or the covering provider during after hours 7P-7A, please log into the web site [email or web address] and access using universal [place] password for that web site. If you do not have the password, please call the hospital operator.  01/07/2021, 12:54 PM

## 2021-01-08 DIAGNOSIS — B1920 Unspecified viral hepatitis C without hepatic coma: Secondary | ICD-10-CM | POA: Diagnosis not present

## 2021-01-08 DIAGNOSIS — F1010 Alcohol abuse, uncomplicated: Secondary | ICD-10-CM | POA: Diagnosis not present

## 2021-01-08 DIAGNOSIS — R627 Adult failure to thrive: Secondary | ICD-10-CM | POA: Diagnosis not present

## 2021-01-08 DIAGNOSIS — U071 COVID-19: Secondary | ICD-10-CM | POA: Diagnosis not present

## 2021-01-08 MED ORDER — ENSURE ENLIVE PO LIQD: 237.0000 mL | Freq: Three times a day (TID) | ORAL | 12 refills | Status: AC

## 2021-01-08 MED ORDER — HYDROCORTISONE 1 % EX CREA: TOPICAL_CREAM | Freq: Three times a day (TID) | CUTANEOUS | 0 refills | Status: AC | PRN

## 2021-01-08 MED ORDER — ACETAMINOPHEN 325 MG PO TABS: 650.0000 mg | ORAL_TABLET | Freq: Four times a day (QID) | ORAL | Status: AC | PRN

## 2021-01-08 MED ORDER — SENNOSIDES-DOCUSATE SODIUM 8.6-50 MG PO TABS: 2.0000 | ORAL_TABLET | Freq: Two times a day (BID) | ORAL | Status: AC

## 2021-01-08 MED ORDER — THIAMINE HCL 100 MG PO TABS: 100.0000 mg | ORAL_TABLET | Freq: Every day | ORAL | Status: AC

## 2021-01-08 MED ORDER — NICOTINE 21 MG/24HR TD PT24: 21.0000 mg | MEDICATED_PATCH | Freq: Every day | TRANSDERMAL | 0 refills | Status: AC

## 2021-01-08 MED ORDER — PANCRELIPASE (LIP-PROT-AMYL) 12000-38000 UNITS PO CPEP: 12000.0000 [IU] | ORAL_CAPSULE | Freq: Three times a day (TID) | ORAL | Status: AC

## 2021-01-08 MED ORDER — MIDODRINE HCL 2.5 MG PO TABS: 2.5000 mg | ORAL_TABLET | Freq: Three times a day (TID) | ORAL | Status: AC

## 2021-01-08 MED ORDER — FOLIC ACID 1 MG PO TABS: 1.0000 mg | ORAL_TABLET | Freq: Every day | ORAL | Status: AC

## 2021-01-08 MED ORDER — HYDROCOD POLST-CPM POLST ER 10-8 MG/5ML PO SUER: 5.0000 mL | Freq: Two times a day (BID) | ORAL | 0 refills | Status: AC | PRN

## 2021-01-08 MED ORDER — ADULT MULTIVITAMIN W/MINERALS CH: 1.0000 | ORAL_TABLET | Freq: Every day | ORAL | Status: AC

## 2021-01-08 NOTE — Discharge Summary (Signed)
Charles Ray:[id number] DOB: 05-Jan-1951 DOA: 12/28/2020  PCP: Jerl Mina, MD  Admit date: 12/28/2020 Discharge date: 01/08/2021  Admitted From: Home Disposition: SNF  Recommendations for Outpatient Follow-up:  Follow up with PCP in 1 week Please obtain BMP/CBC in one week      Discharge Condition:Stable CODE STATUS:full Diet recommendation: Dysphagia 3 diet    Brief/Interim Summary: Per ATF:TDDUKG C Ihnen is a 70 y.o. male with medical history significant of previous hip fracture in 06/2020, alcohol use, tobacco use, patient is currently living with his son in a motel.  He was previously living with his wife until a week ago.  Review of records indicate that patient has been having some issues with his memory as well as having significant weight loss.  He reports that for the past week he has had decreased p.o. intake and lack of appetite.He has been increasingly weak.  Records indicate that family has had difficulty getting him up.  He does have some chronic issues with hip pain from his surgery earlier this year.  It has been even harder to get him up and moving now.He also had a cough which had been nonproductive.On arrival CT chest performed was negative for pulmonary embolism.  It did indicate developing left lower lobe pneumonia.  He was noted to have elevated WBC count.  Lactic acid mildly elevated, improved with 1 L fluid.  COVID test was positive.    Left lower lobe aspiration pneumonia. COVID-19 pneumonia. Improved Completed course of Remdesivir.   Completed antibiotic course.  Sepsis ruled out. On a 10-day quarantine prior to being discharged to SNF       Failure to thrive. Anorexia. Severe protein calorie malnutrition. On protein supplements, needs to continue    Generalized weakness Pancreatic insufficiency. Acute metabolic encephalopathy. Appetite improved  Diarrhea resolved  On creon MS improved, has some underlying dementia likely. Going to  SNF.          Alcohol abuse with alcohol dementia. MS improved  No evidence of withdrawal.  Not on CIWA protocol  Continue thiamine         Discharge Diagnoses:  Active Problems:   Hepatitis C   Tobacco abuse   Alcohol abuse   Pneumonia due to COVID-19 virus   Weakness   Failure to thrive in adult   Severe protein-calorie malnutrition (HCC)   Reactive thrombocytosis   Hyponatremia    Discharge Instructions   Allergies as of 01/08/2021   No Known Allergies      Medication List     STOP taking these medications    enoxaparin 40 MG/0.4ML injection Commonly known as: LOVENOX   HYDROcodone-acetaminophen 5-325 MG tablet Commonly known as: NORCO/VICODIN       TAKE these medications    acetaminophen 325 MG tablet Commonly known as: TYLENOL Take 2 tablets (650 mg total) by mouth every 6 (six) hours as needed for mild pain or headache (fever >/= 101).   albuterol 108 (90 Base) MCG/ACT inhaler Commonly known as: VENTOLIN HFA Inhale 2 puffs into the lungs every 4 (four) hours as needed for wheezing or shortness of breath.   amitriptyline 150 MG tablet Commonly known as: ELAVIL Take 300 mg by mouth at bedtime.   chlorpheniramine-HYDROcodone 10-8 MG/5ML Suer Commonly known as: TUSSIONEX Take 5 mLs by mouth every 12 (twelve) hours as needed for cough.   feeding supplement Liqd Take 237 mLs by mouth 3 (three) times daily between meals.   folic acid 1 MG tablet Commonly known  as: FOLVITE Take 1 tablet (1 mg total) by mouth daily.   hydrocortisone cream 1 % Apply topically 3 (three) times daily as needed for itching.   lipase/protease/amylase 12000-38000 units Cpep capsule Commonly known as: CREON Take 1 capsule (12,000 Units total) by mouth 3 (three) times daily with meals.   midodrine 2.5 MG tablet Commonly known as: PROAMATINE Take 1 tablet (2.5 mg total) by mouth 3 (three) times daily with meals.   multivitamin with minerals Tabs tablet Take 1  tablet by mouth daily.   nicotine 21 mg/24hr patch Commonly known as: NICODERM CQ - dosed in mg/24 hours Place 1 patch (21 mg total) onto the skin daily.   senna-docusate 8.6-50 MG tablet Commonly known as: Senokot-S Take 2 tablets by mouth 2 (two) times daily.   thiamine 100 MG tablet Take 1 tablet (100 mg total) by mouth daily.        Contact information for follow-up providers     Jerl Mina, MD Follow up in 1 week(s).   Specialty: Family Medicine Contact information: [address] [facility] [place] [address] [phone]              Contact information for after-discharge care     Destination     [facility] C[facility] Preferred SNF .   Service: Skilled Nursing Contact information: [address] [place] [address] [phone]                    No Known Allergies  Consultations:    Procedures/Studies: DG Chest 2 View  Result Date: 12/28/2020 CLINICAL DATA:  cough EXAM: CHEST - 2 VIEW COMPARISON:  None. FINDINGS: The cardiomediastinal silhouette is within normal limits. No pleural effusion. No pneumothorax. No mass or consolidation. Healing right tenth rib fracture. IMPRESSION: 1. No acute cardiopulmonary radiographic abnormality. 2. Healing right tenth rib fracture. Electronically Signed   By: Olive Bass M.D.   On: 12/28/2020 11:49   CT HEAD WO CONTRAST ( )  Result Date: 12/28/2020 CLINICAL DATA:  Generalized weakness, status post left hip replacement 2 weeks ago. EXAM: CT HEAD WITHOUT CONTRAST TECHNIQUE: Contiguous axial images were obtained from the base of the skull through the vertex without intravenous contrast. COMPARISON:  September 20, 2018 FINDINGS: Brain: There is mild cerebral atrophy with widening of the extra-axial spaces and ventricular dilatation. There are areas of decreased attenuation within the white matter tracts of the supratentorial brain, consistent with microvascular disease changes.  A chronic right basal ganglia lacunar infarct is seen. Vascular: No hyperdense vessel or unexpected calcification. Skull: Normal. Negative for fracture or focal lesion. Sinuses/Orbits: No acute finding. Other: None. IMPRESSION: 1. Generalized cerebral atrophy. 2. Chronic right basal ganglia lacunar infarct. 3. No acute intracranial abnormality. Electronically Signed   By: Aram Candela M.D.   On: 12/28/2020 16:50   CT Angio Chest PE W and/or Wo Contrast  Result Date: 12/28/2020 CLINICAL DATA:  PE suspected, high probability. Two weeks status post left hip replacement. EXAM: CT ANGIOGRAPHY CHEST WITH CONTRAST TECHNIQUE: Multidetector CT imaging of the chest was performed using the standard protocol during bolus administration of intravenous contrast. Multiplanar CT image reconstructions and MIPs were obtained to evaluate the vascular anatomy. CONTRAST:  42mL OMNIPAQUE IOHEXOL 350 MG/ML SOLN COMPARISON:  None. FINDINGS: Cardiovascular: Satisfactory opacification of the pulmonary arteries to the segmental level. No evidence of pulmonary embolism. Normal heart size. No pericardial effusion. Ectatic but nonaneurysmal ascending thoracic aorta. Atherosclerotic calcifications present along the thoracic aorta  and coronary arteries. Mediastinum/Nodes: No enlarged mediastinal, hilar, or axillary lymph nodes. Thyroid gland, trachea, and esophagus demonstrate no significant findings. Lungs/Pleura: Advanced combined centrilobular and paraseptal pulmonary emphysema. Patchy airspace opacity present along the dependent portion of the left lower lobe. Diffuse mild bronchial wall thickening. No suspicious pulmonary mass or nodule. Biapical pleuroparenchymal scarring. Upper Abdomen: No acute abnormality. Multiple small radiopaque stones layer within the gallbladder lumen. No findings to suggest acute cholecystitis. Peripherally calcified low-attenuation lesion within the interpolar left kidney measures up to 2.6 cm. This lesion  is incompletely characterized on the present examination. Musculoskeletal: No chest wall abnormality. No acute or significant osseous findings. Probable remote L2 compression fracture. Review of the MIP images confirms the above findings. IMPRESSION: 1. Negative for acute pulmonary embolus. 2. Patchy airspace opacity in the dependent aspect of the left lower lobe may reflect early bronchopneumonia or small volume aspiration. 3. Aortic and coronary artery atherosclerotic vascular calcifications. 4. Advanced combined paraseptal and centrilobular pulmonary emphysema. 5. Cholelithiasis. 6. Likely remote L2 compression fracture. 7. Incompletely evaluated but likely benign peripherally calcified cystic lesion in the left kidney. Aortic Atherosclerosis (ICD10-I70.0) and Emphysema (ICD10-J43.9). Electronically Signed   By: Malachy Moan M.D.   On: 12/28/2020 16:31   US Venous Img Lower Bilateral (DVT)  Result Date: 12/29/2020 CLINICAL DATA:  Elevated liver function tests. EXAM: BILATERAL LOWER EXTREMITY VENOUS DOPPLER ULTRASOUND TECHNIQUE: Gray-scale sonography with graded compression, as well as color Doppler and duplex ultrasound were performed to evaluate the lower extremity deep venous systems from the level of the common femoral vein and including the common femoral, femoral, profunda femoral, popliteal and calf veins including the posterior tibial, peroneal and gastrocnemius veins when visible. The superficial great saphenous vein was also interrogated. Spectral Doppler was utilized to evaluate flow at rest and with distal augmentation maneuvers in the common femoral, femoral and popliteal veins. COMPARISON:  None. FINDINGS: RIGHT LOWER EXTREMITY Common Femoral Vein: No evidence of thrombus. Normal compressibility, respiratory phasicity and response to augmentation. Saphenofemoral Junction: No evidence of thrombus. Normal compressibility and flow on color Doppler imaging. Profunda Femoral Vein: No evidence of  thrombus. Normal compressibility and flow on color Doppler imaging. Femoral Vein: No evidence of thrombus. Normal compressibility, respiratory phasicity and response to augmentation. Popliteal Vein: No evidence of thrombus. Normal compressibility, respiratory phasicity and response to augmentation. Calf Veins: No evidence of thrombus. Normal compressibility and flow on color Doppler imaging. Superficial Great Saphenous Vein: No evidence of thrombus. Normal compressibility. Venous Reflux:  None. Other Findings:  None. LEFT LOWER EXTREMITY Common Femoral Vein: No evidence of thrombus. Normal compressibility, respiratory phasicity and response to augmentation. Saphenofemoral Junction: No evidence of thrombus. Normal compressibility and flow on color Doppler imaging. Profunda Femoral Vein: No evidence of thrombus. Normal compressibility and flow on color Doppler imaging. Femoral Vein: No evidence of thrombus. Normal compressibility, respiratory phasicity and response to augmentation. Popliteal Vein: No evidence of thrombus. Normal compressibility, respiratory phasicity and response to augmentation. Calf Veins: No evidence of thrombus. Normal compressibility and flow on color Doppler imaging. Superficial Great Saphenous Vein: No evidence of thrombus. Normal compressibility. Venous Reflux:  None. Other Findings:  None. IMPRESSION: No evidence of deep venous thrombosis in either lower extremity. Electronically Signed   By: Duanne Guess D.O.   On: 12/29/2020 13:58      Subjective: No complaints  Discharge Exam: Vitals:   01/08/21 0459 01/08/21 0807  BP: 101/64 105/60  Pulse: 77 79  Resp: 16 18  Temp: (!) 97.3 F (  36.3 C) (!) 97.5 F (36.4 C)  SpO2: 96% 96%   Vitals:   01/07/21 1717 01/07/21 2109 01/08/21 0459 01/08/21 0807  BP: 107/68 116/72 101/64 105/60  Pulse: 84 84 77 79  Resp: 16 16 16 18   Temp: 97.6 F (36.4 C) 97.7 F (36.5 C) (!) 97.3 F (36.3 C) (!) 97.5 F (36.4 C)  TempSrc: Oral    Axillary  SpO2: 97% 94% 96% 96%  Weight:      Height:        General: Pt is alert, awake, not in acute distress Cardiovascular: RRR, S1/S2 +, no rubs, no gallops Respiratory: CTA bilaterally, no wheezing, no rhonchi Abdominal: Soft, NT, ND, bowel sounds + Extremities: no edema   The results of significant diagnostics from this hospitalization (including imaging, microbiology, ancillary and laboratory) are listed below for reference.     Microbiology: No results found for this or any previous visit (from the past 240 hour(s)).   Labs: BNP (last 3 results) No results for input(s): BNP in the last 8760 hours. Basic Metabolic Panel: Recent Labs  Lab 01/02/21 0531 01/03/21 0358 01/04/21 0544  NA 135 134*  --   K 4.7 4.6  --   CL 104 100  --   CO2 28 28  --   GLUCOSE 102* 117*  --   BUN 26* 30*  --   CREATININE 0.85 0.89 1.00  CALCIUM 8.5* 8.4*  --   MG 2.0 1.9  --   PHOS  --  3.9  --    Liver Function Tests: No results for input(s): AST, ALT, ALKPHOS, BILITOT, PROT, ALBUMIN in the last 168 hours. No results for input(s): LIPASE, AMYLASE in the last 168 hours. Recent Labs  Lab 01/02/21 0531  AMMONIA 16   CBC: No results for input(s): WBC, NEUTROABS, HGB, HCT, MCV, PLT in the last 168 hours. Cardiac Enzymes: No results for input(s): CKTOTAL, CKMB, CKMBINDEX, TROPONINI in the last 168 hours. BNP: Invalid input(s): POCBNP CBG: No results for input(s): GLUCAP in the last 168 hours. D-Dimer No results for input(s): DDIMER in the last 72 hours. Hgb A1c No results for input(s): HGBA1C in the last 72 hours. Lipid Profile No results for input(s): CHOL, HDL, LDLCALC, TRIG, CHOLHDL, LDLDIRECT in the last 72 hours. Thyroid function studies No results for input(s): TSH, T4TOTAL, T3FREE, THYROIDAB in the last 72 hours.  Invalid input(s): FREET3 Anemia work up No results for input(s): VITAMINB12, FOLATE, FERRITIN, TIBC, IRON, RETICCTPCT in the last 72 hours. Urinalysis     Component Value Date/Time   COLORURINE YELLOW 12/28/2020 1327   APPEARANCEUR CLEAR 12/28/2020 1327   LABSPEC 1.025 12/28/2020 1327   PHURINE 5.5 12/28/2020 1327   GLUCOSEU NEGATIVE 12/28/2020 1327   HGBUR NEGATIVE 12/28/2020 1327   BILIRUBINUR NEGATIVE 12/28/2020 1327   KETONESUR NEGATIVE 12/28/2020 1327   PROTEINUR NEGATIVE 12/28/2020 1327   NITRITE NEGATIVE 12/28/2020 1327   LEUKOCYTESUR NEGATIVE 12/28/2020 1327   Sepsis Labs Invalid input(s): PROCALCITONIN,  WBC,  LACTICIDVEN Microbiology No results found for this or any previous visit (from the past 240 hour(s)).   Time coordinating discharge: Over 30 minutes  SIGNED:   12/30/2020, MD  [facility] 01/08/2021, 8:34 AM Pager   If 7PM-7AM, please contact night-coverage [email or web address] Password [email or web address]

## 2021-01-08 NOTE — TOC Transition Note (Signed)
Transition of Care [facility]) - CM/SW Discharge Note   Patient Details  Name: Charles Ray MRN: [id number] Date of Birth: [birth year]  [facility] CM/SW Contact:  Allayne Butcher, RN Phone Number: 01/08/2021, 10:24 AM   Clinical Narrative:    Patient medically cleared for discharge to [facility] today.  Authorization approved.  Once [facility] has given patient a room assignment bedside RN will call report and RNCM will arrange transport via [facility].  Patient's son Harlon updated on DC for today.   Final next level of care: Skilled Nursing Facility Barriers to Discharge: Barriers Resolved   Patient Goals and CMS Choice Patient states their goals for this hospitalization and ongoing recovery are:: Patient unable to state goals but patient's son would like him to go to rehab and then possibly go live with him in [facility].gov Compare Post Acute Care list provided to:: Patient Represenative (must comment) Choice offered to / list presented to : Adult Children  Discharge Placement              Patient chooses bed at: [facility] Patient to be transferred to facility by: [facility] Name of family member notified: Maisie Fus Patient and family notified of of transfer: 01/08/21  Discharge Plan and Services   Discharge Planning Services: CM Consult Post Acute Care Choice: Skilled Nursing Facility          DME Arranged: N/A DME Agency: NA       HH Arranged: NA HH Agency: NA        Social Determinants of Health (SDOH) Interventions     Readmission Risk Interventions No flowsheet data found.

## 2021-01-08 NOTE — TOC Progression Note (Signed)
Transition of Care [facility]) - Progression Note    Patient Details  Name: Charles Ray MRN: [id number] Date of Birth: November 02, 1950  Transition of Care [facility]) CM/SW Contact  Allayne Butcher, RN Phone Number: 01/08/2021, 11:00 AM  Clinical Narrative:    Patient going to room 65A, bedside RN to call report and RNCM will arrange transport.   Expected Discharge Plan: Skilled Nursing Facility Barriers to Discharge: Barriers Resolved  Expected Discharge Plan and Services Expected Discharge Plan: Skilled Nursing Facility   Discharge Planning Services: CM Consult Post Acute Care Choice: Skilled Nursing Facility Living arrangements for the past 2 months: Single Family Home Expected Discharge Date: 01/08/21               DME Arranged: N/A DME Agency: NA       HH Arranged: NA HH Agency: NA         Social Determinants of Health (SDOH) Interventions    Readmission Risk Interventions No flowsheet data found.

## 2021-07-07 ENCOUNTER — Other Ambulatory Visit: Payer: Self-pay

## 2021-07-07 ENCOUNTER — Emergency Department
Admission: EM | Admit: 2021-07-07 | Discharge: 2021-07-07 | Disposition: A | Discharge status: Home / Self Care | Payer: Medicare PPO | Attending: Emergency Medicine | Admitting: Emergency Medicine

## 2021-07-07 DIAGNOSIS — N342 Other urethritis: Secondary | ICD-10-CM | POA: Insufficient documentation

## 2021-07-07 DIAGNOSIS — R300 Dysuria: Secondary | ICD-10-CM | POA: Diagnosis present

## 2021-07-07 LAB — URINALYSIS, ROUTINE W REFLEX MICROSCOPIC
Bilirubin Urine: NEGATIVE
Glucose, UA: NEGATIVE mg/dL
Hgb urine dipstick: NEGATIVE
Ketones, ur: NEGATIVE mg/dL
Leukocytes,Ua: NEGATIVE
Nitrite: NEGATIVE
Protein, ur: NEGATIVE mg/dL
Specific Gravity, Urine: 1.021 (ref 1.005–1.030)
pH: 5 (ref 5.0–8.0)

## 2021-07-07 MED ORDER — DOXYCYCLINE HYCLATE 100 MG PO TABS: 100.0000 mg | ORAL_TABLET | Freq: Two times a day (BID) | ORAL | 0 refills | Status: AC

## 2021-07-07 MED ORDER — PHENAZOPYRIDINE HCL 100 MG PO TABS: 100.0000 mg | ORAL_TABLET | Freq: Three times a day (TID) | ORAL | 0 refills | Status: AC | PRN

## 2021-07-07 MED ORDER — DOXYCYCLINE HYCLATE 100 MG PO TABS
100.0000 mg | ORAL_TABLET | Freq: Once | ORAL | Status: AC
  Administered 2021-07-07: 100 mg via ORAL
  Filled 2021-07-07: qty 1

## 2021-07-07 NOTE — ED Triage Notes (Signed)
Pt comes pov with dysuria. States his PCP checked his urine and it wasn't an infection. Denies any other issues or prostate issues.  ?

## 2021-07-07 NOTE — ED Provider Notes (Signed)
? ?[facility] ?Provider Note ? ?Patient Contact: 5:43 PM (approximate) ? ? ?History  ? ?Dysuria ? ? ?HPI ? ?Charles Ray is a 71 y.o. male who presents the emergency department with a burning sensation in his urethra when he urinates.  Patient states that he has had no bladder pain, no pelvic pain, no pain in the region of his prostate.  He states that there is a burning sensation in the distal aspect of the penile shaft with urination.  He is not urinating more than normal but states that he feels like if he could urinate more he would be able to calm down some of the burn.  It is only present during urination.  No concerns for STD.  No flank pain.  No hematuria.  Patient states that he went to his primary care yesterday had a reassuring urinalysis with no gross signs of infection.  Patient has no penile lesions or sores ?  ? ? ?Physical Exam  ? ?Triage Vital Signs: ?ED Triage Vitals  ?Enc Vitals Group  ?   BP 07/07/21 1451 (!) 148/89  ?   Pulse Rate 07/07/21 1451 82  ?   Resp 07/07/21 1451 18  ?   Temp 07/07/21 1451 97.9 ?F (36.6 ?C)  ?   Temp Source 07/07/21 1451 Oral  ?   SpO2 07/07/21 1451 96 %  ?   Weight 07/07/21 1450 170 lb (77.1 kg)  ?   Height --   ?   Head Circumference --   ?   Peak Flow --   ?   Pain Score 07/07/21 1450 10  ?   Pain Loc --   ?   Pain Edu? --   ?   Excl. in GC? --   ? ? ?Most recent vital signs: ?Vitals:  ? 07/07/21 1451  ?BP: (!) 148/89  ?Pulse: 82  ?Resp: 18  ?Temp: 97.9 ?F (36.6 ?C)  ?SpO2: 96%  ? ? ? ?General: Alert and in no acute distress.  ?Cardiovascular:  Good peripheral perfusion ?Respiratory: Normal respiratory effort without tachypnea or retractions. Lungs CTAB.  ?Gastrointestinal: Bowel sounds ?4 quadrants. Soft and nontender to palpation. No guarding or rigidity. No palpable masses. No distention. No CVA tenderness. ?Musculoskeletal: Full range of motion to all extremities.  ?Neurologic:  No gross focal neurologic deficits are appreciated.  ?Skin:    No rash noted ?Other: ? ? ?ED Results / Procedures / Treatments  ? ?Labs ?(all labs ordered are listed, but only abnormal results are displayed) ?Labs Reviewed  ?URINALYSIS, ROUTINE W REFLEX MICROSCOPIC - Abnormal; Notable for the following components:  ?    Result Value  ? Color, Urine YELLOW (*)   ? APPearance CLEAR (*)   ? All other components within normal limits  ? ? ? ?EKG ? ? ? ? ?RADIOLOGY ? ? ? ?No results found. ? ?PROCEDURES: ? ?Critical Care performed: No ? ?Procedures ? ? ?MEDICATIONS ORDERED IN ED: ?Medications  ?doxycycline (VIBRA-TABS) tablet 100 mg (100 mg Oral Given 07/07/21 1752)  ? ? ? ?IMPRESSION / MDM / ASSESSMENT AND PLAN / ED COURSE  ?I reviewed the triage vital signs and the nursing notes. ?             ?               ? ?Differential diagnosis includes, but is not limited to, UTI, STD, urethritis, prostatitis ? ? ?Patient's diagnosis is consistent with urethritis.  Patient presents emergency  department complaining of a burning sensation in the distal aspect of the penis with urination.  He has no pain in his testicles, in the region of his prostate or in his pelvis/suprapubic region.  No CVA pain or tenderness on exam.  There is no penile lesions or sores.  No concern for STD.  Patient will be treated with doxycycline and Pyridium for improvement of symptoms.  Concerning signs and symptoms to include testicular pain, pain in the region of the prostate, pelvic pain or flank pain is discussed with the patient.  Drink plenty of fluids, Tylenol Motrin for any additional pain relief..  Follow-up with primary care or urology if needed.  Patient is given ED precautions to return to the ED for any worsening or new symptoms. ? ? ?  ? ? ?FINAL CLINICAL IMPRESSION(S) / ED DIAGNOSES  ? ?Final diagnoses:  ?Urethritis  ? ? ? ?Rx / DC Orders  ? ?ED Discharge Orders   ? ?      Ordered  ?  doxycycline (VIBRA-TABS) 100 MG tablet  2 times daily       ? 07/07/21 1747  ?  phenazopyridine (PYRIDIUM) 100 MG  tablet  3 times daily PRN       ? 07/07/21 1747  ? ?  ?  ? ?  ? ? ? ?Note:  This document was prepared using Dragon voice recognition software and may include unintentional dictation errors. ?  ?Racheal Patches, PA-C ?07/07/21 1835 ? ?  ?Arnaldo Natal, MD ?07/07/21 2347 ? ?

## 2021-07-07 NOTE — ED Notes (Signed)
Pt to ED for pain in penis "from the inside" that he feels right before starts to urinate. States urinating is not painful. This is ongoing since 2 weeks. Urine has been sent. ?

## 2021-07-07 NOTE — ED Notes (Signed)
Called lab, they will run urine that was already sent. ?

## 2022-05-21 IMAGING — CR DG CHEST 2V
4 series · 4 of 4 positions shown · non-contrast
Comparison: None.

CLINICAL DATA: cough

EXAM:
CHEST - 2 VIEW

[chest lat (1 of 2)]
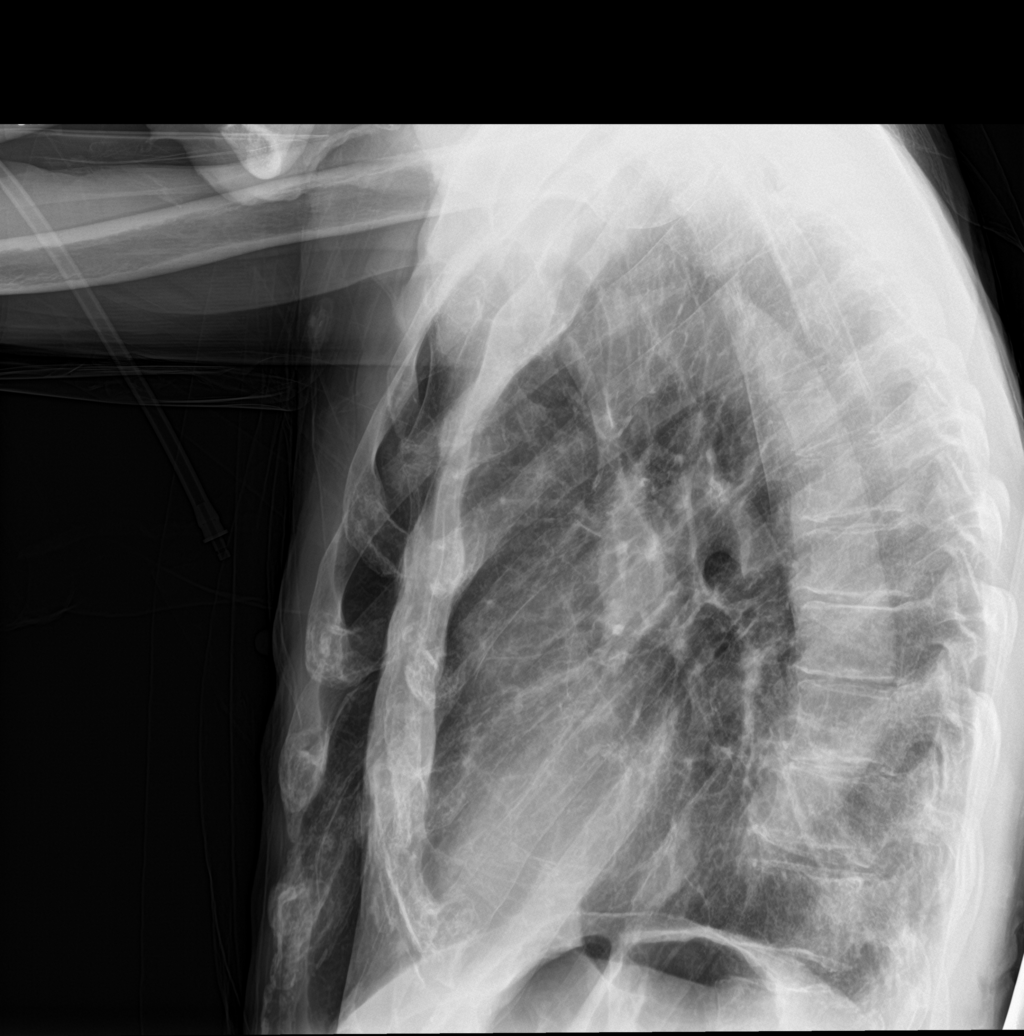

[chest ap (1 of 2)]
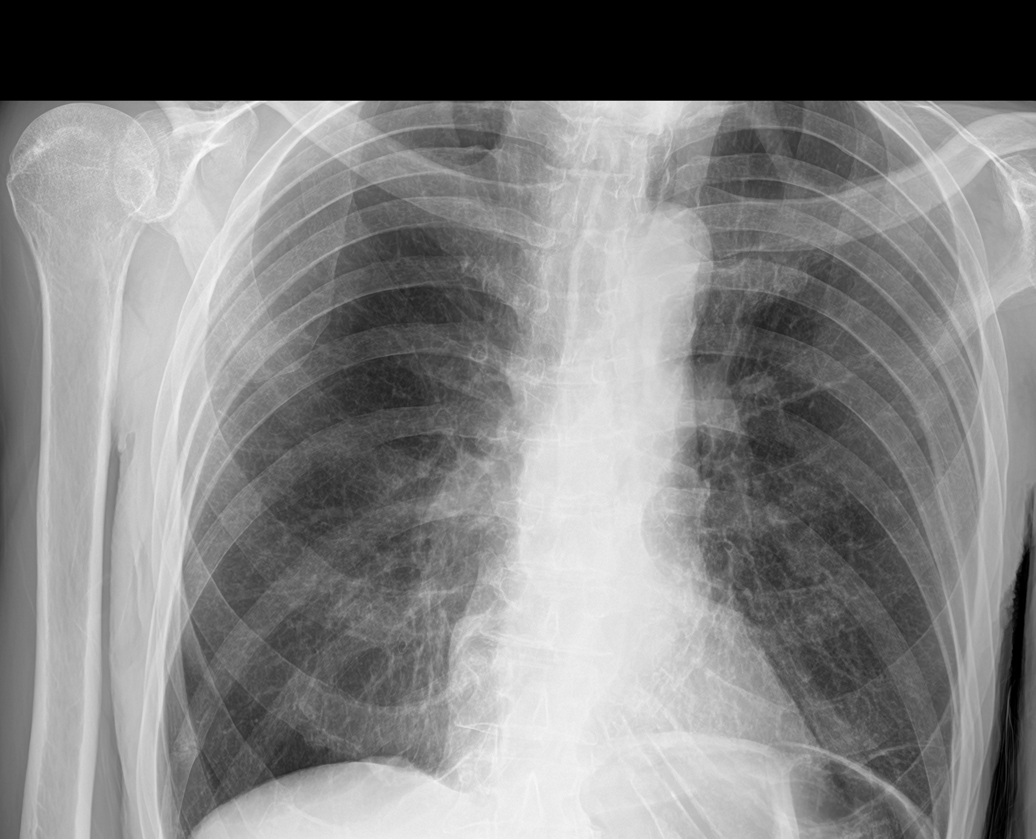

[chest ap (2 of 2)]
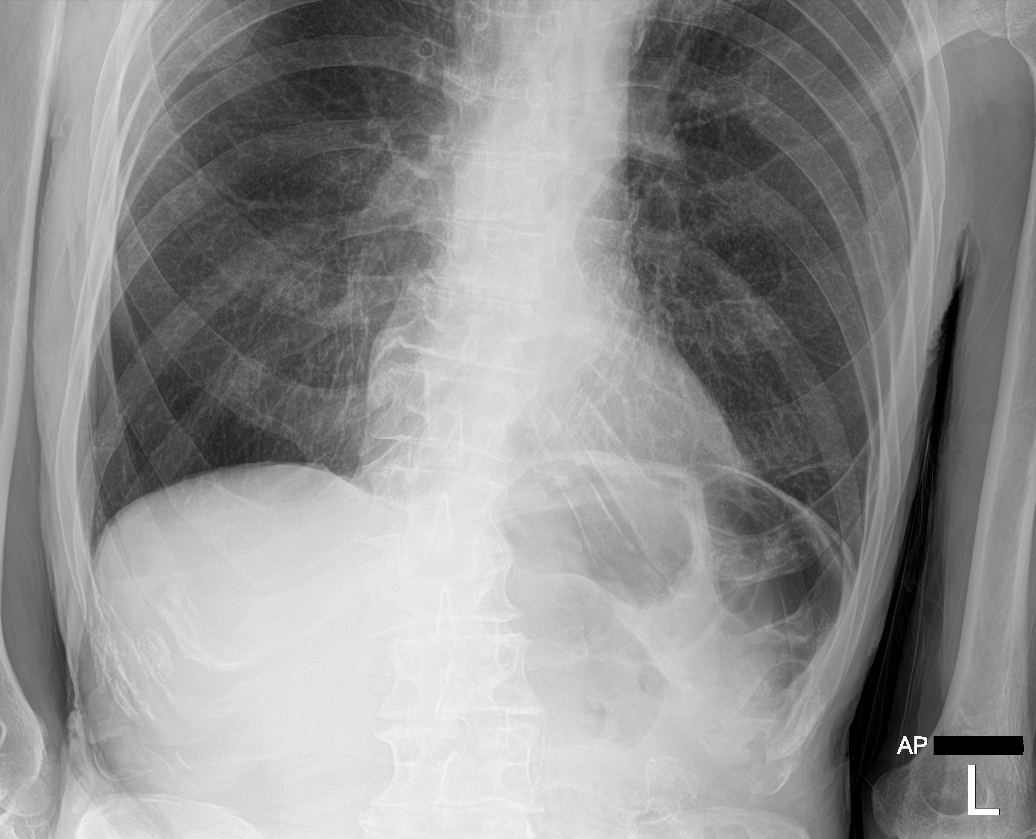

[chest lat (2 of 2)]
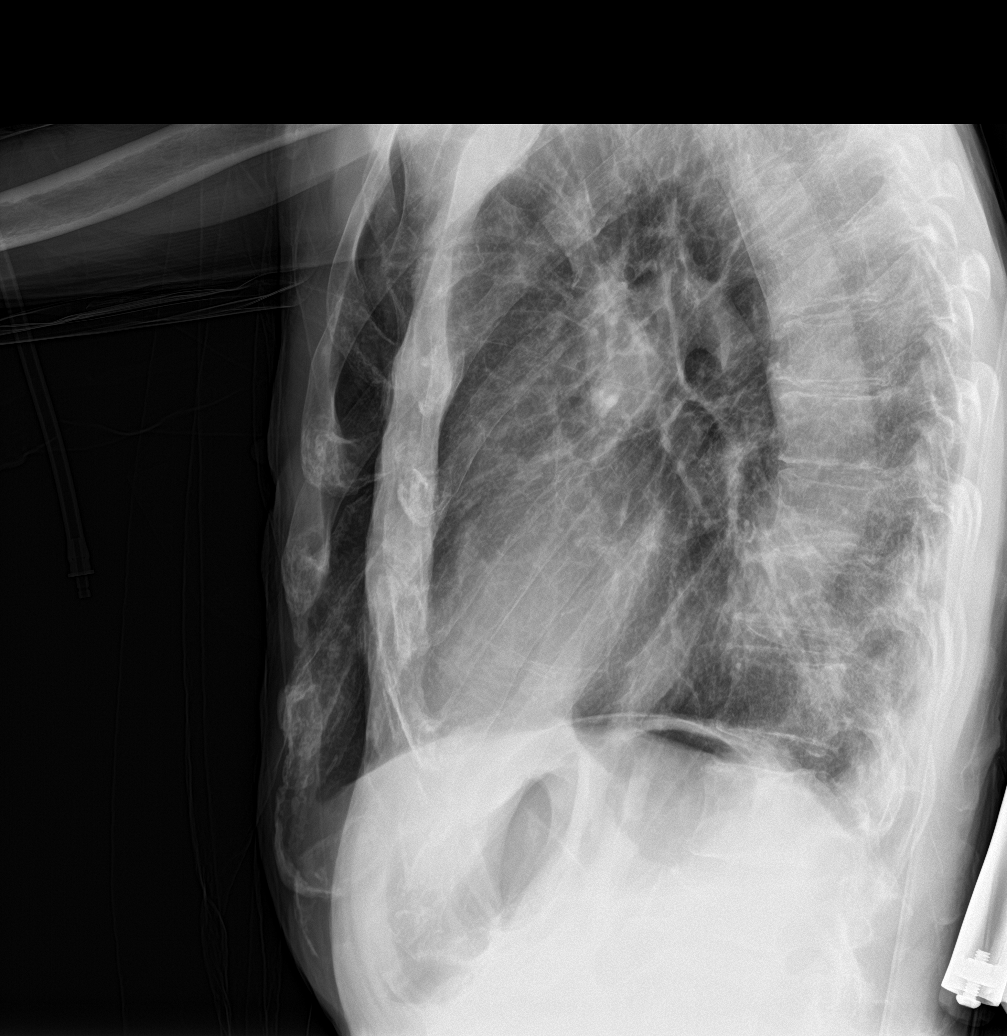

[4 of 4 positions shown; findings below may reference images not displayed]

FINDINGS: The cardiomediastinal silhouette is within normal limits. No pleural
effusion. No pneumothorax. No mass or consolidation. Healing right
tenth rib fracture.
IMPRESSION: 1. No acute cardiopulmonary radiographic abnormality.
2. Healing right tenth rib fracture.

## 2022-05-22 IMAGING — US US EXTREM LOW VENOUS
1 series · 13 of 24 positions shown · non-contrast
Comparison: None.

CLINICAL DATA: Elevated liver function tests.



[Series 1: us venous img lower bilat (dvt) · portal-venous · 13 of 54 slices shown]
[im 1/54]
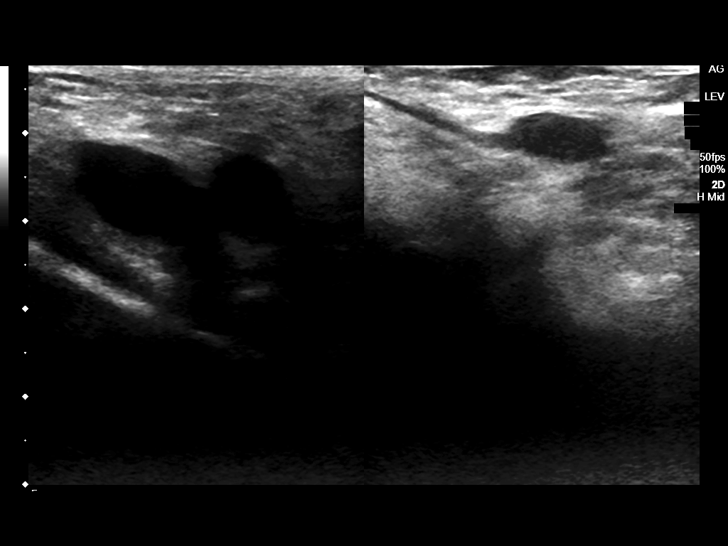
[im 5/54]
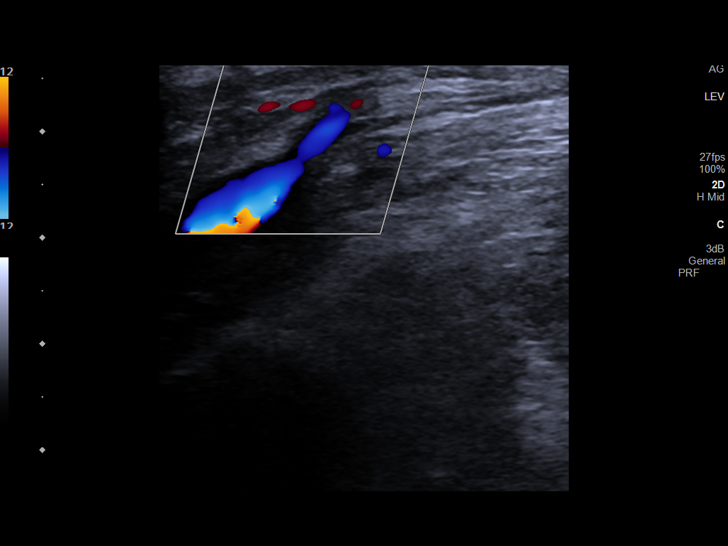
[im 10/54]
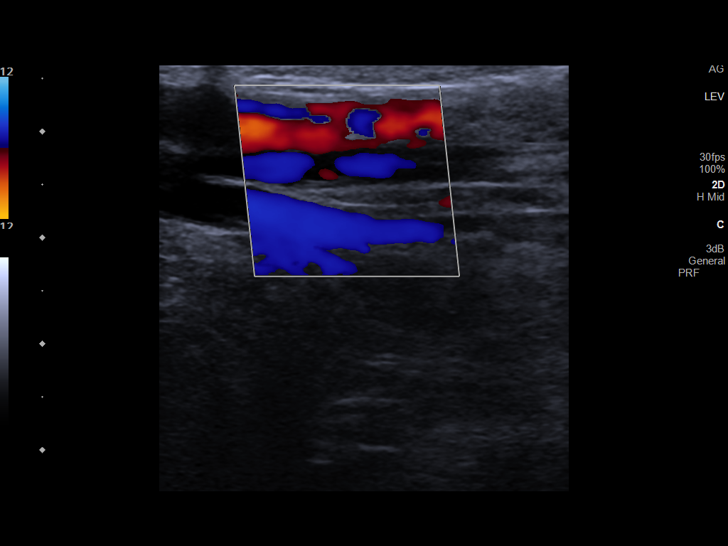
[im 14/54]
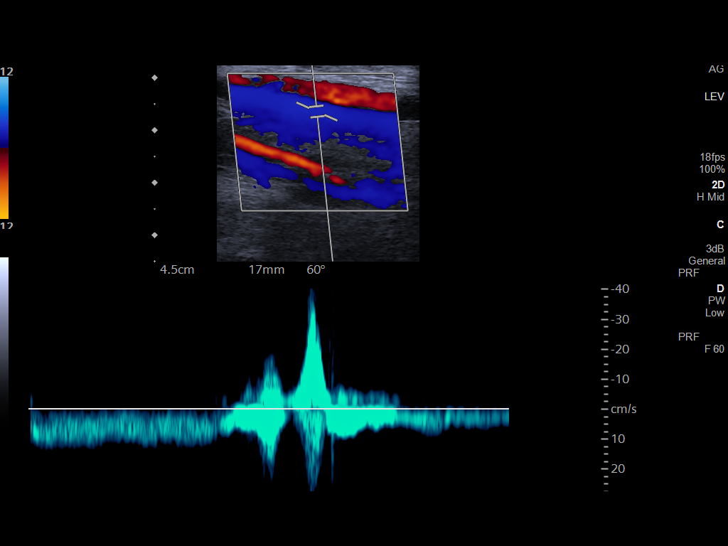
[im 19/54]
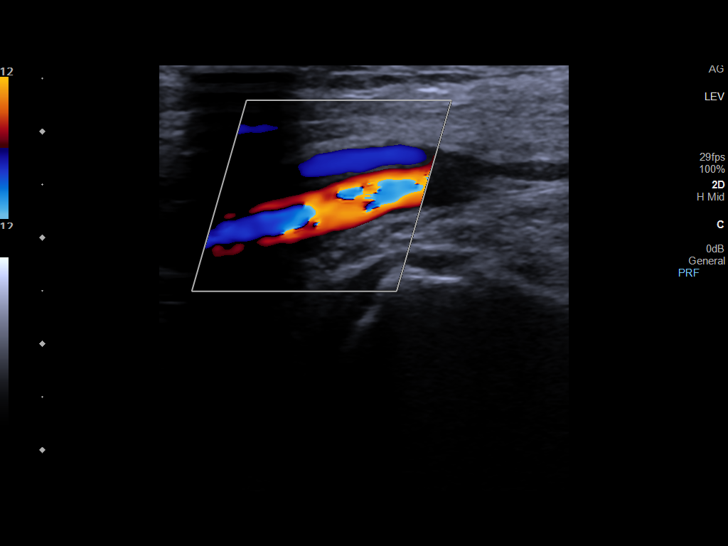
[im 24/54]
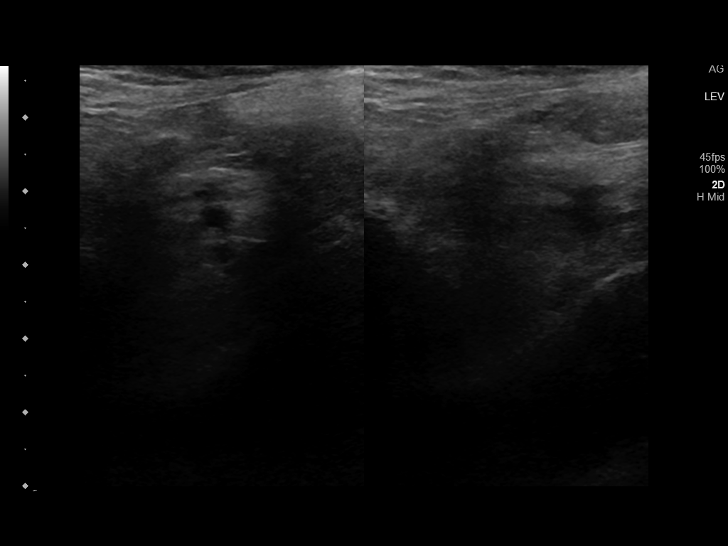
[im 28/54]
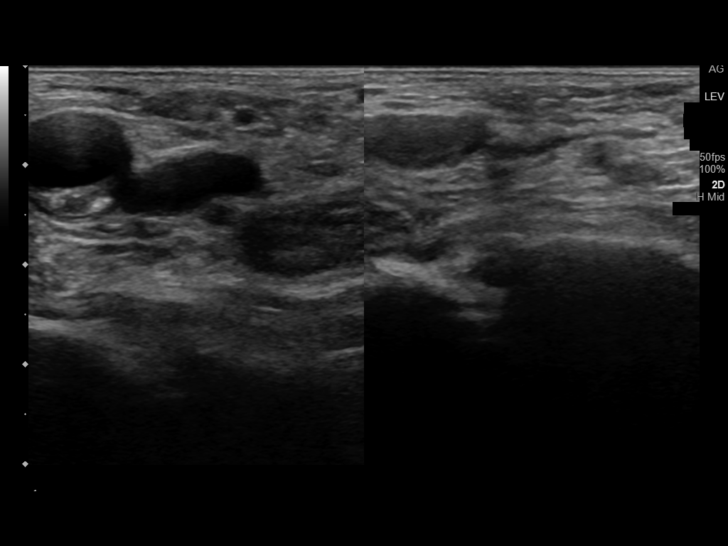
[im 30/54]
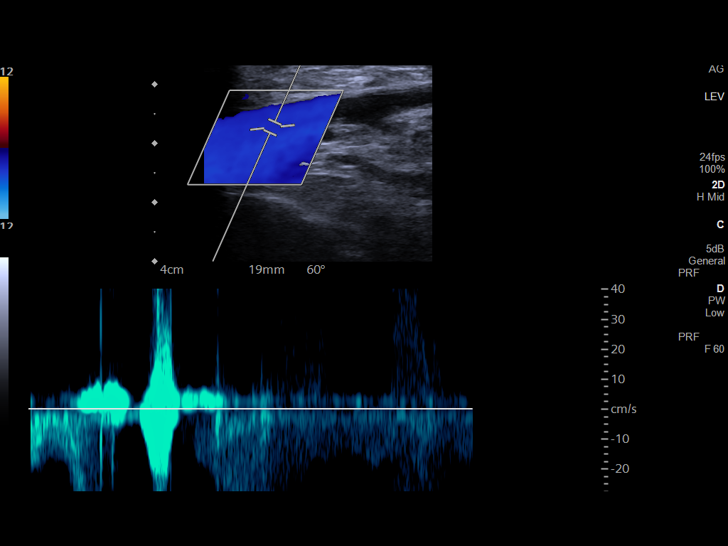
[im 35/54]
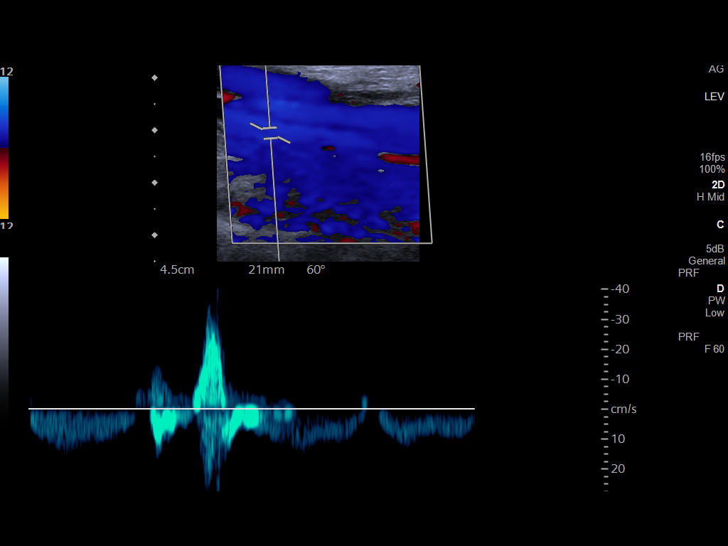
[im 40/54]
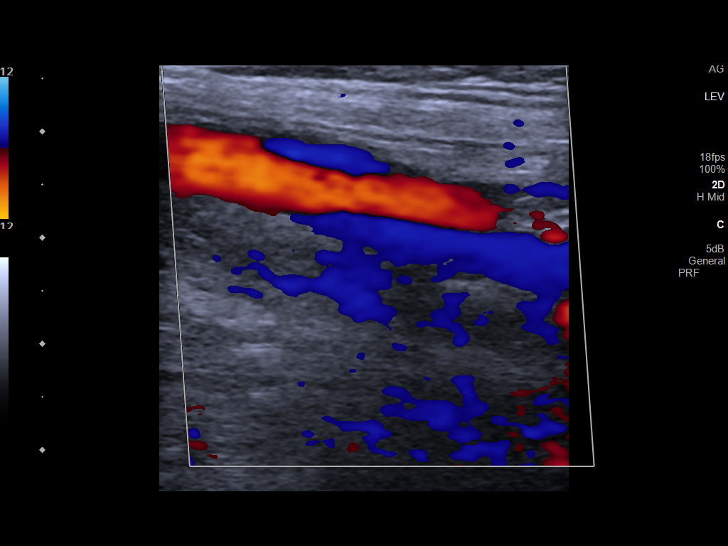
[im 44/54]
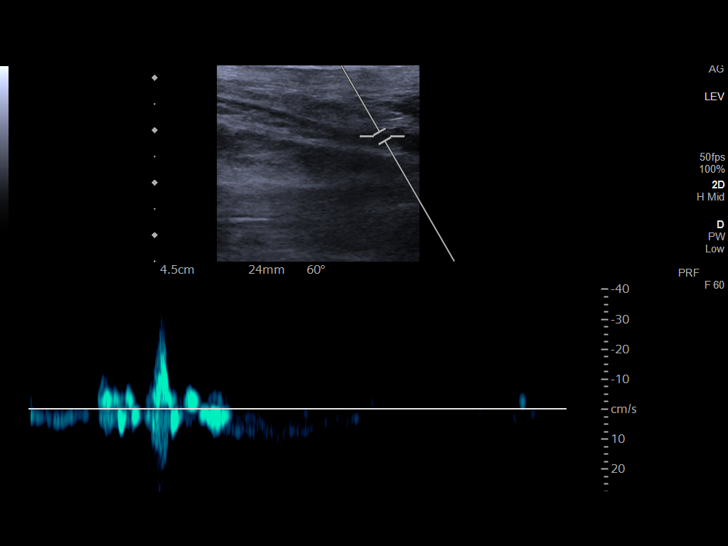
[im 49/54]
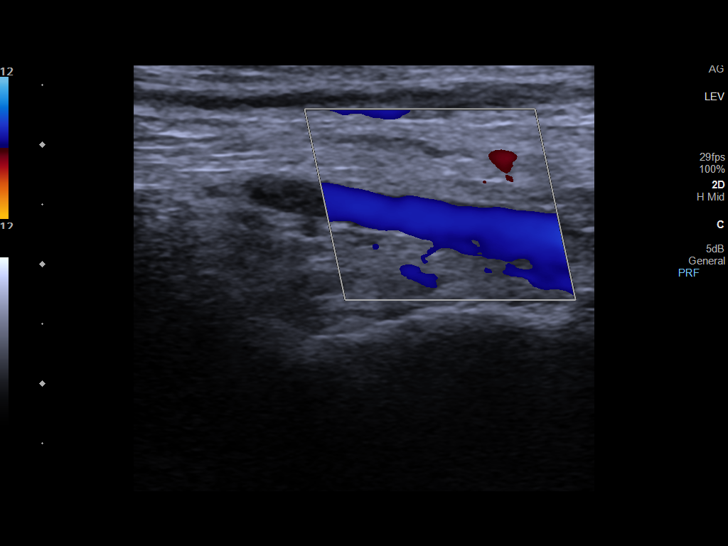
[im 54/54]
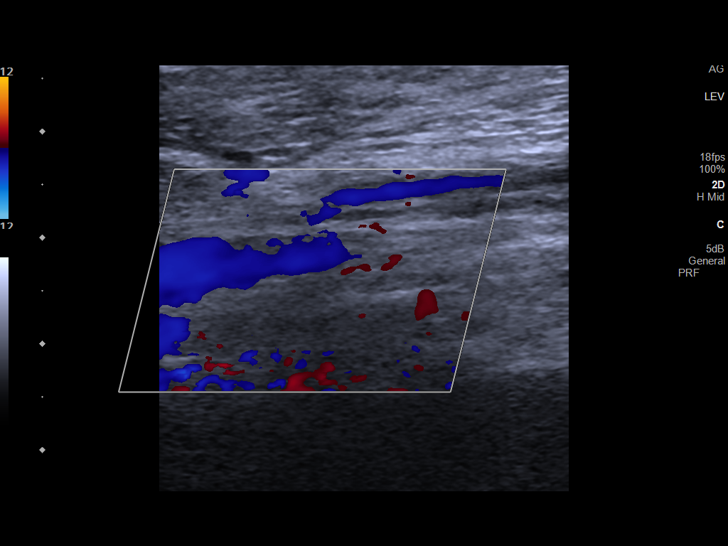

[13 of 24 positions shown; findings below may reference images not displayed]

FINDINGS: RIGHT LOWER EXTREMITY

Common Femoral Vein: No evidence of thrombus. Normal
compressibility, respiratory phasicity and response to augmentation.

Saphenofemoral Junction: No evidence of thrombus. Normal
compressibility and flow on color Doppler imaging.

Profunda Femoral Vein: No evidence of thrombus. Normal
compressibility and flow on color Doppler imaging.

Femoral Vein: No evidence of thrombus. Normal compressibility,
respiratory phasicity and response to augmentation.

Popliteal Vein: No evidence of thrombus. Normal compressibility,
respiratory phasicity and response to augmentation.

Calf Veins: No evidence of thrombus. Normal compressibility and flow
on color Doppler imaging.

Superficial Great Saphenous Vein: No evidence of thrombus. Normal
compressibility.

Venous Reflux:  None.

Other Findings:  None.

LEFT LOWER EXTREMITY

Common Femoral Vein: No evidence of thrombus. Normal
compressibility, respiratory phasicity and response to augmentation.

Saphenofemoral Junction: No evidence of thrombus. Normal
compressibility and flow on color Doppler imaging.

Profunda Femoral Vein: No evidence of thrombus. Normal
compressibility and flow on color Doppler imaging.

Femoral Vein: No evidence of thrombus. Normal compressibility,
respiratory phasicity and response to augmentation.

Popliteal Vein: No evidence of thrombus. Normal compressibility,
respiratory phasicity and response to augmentation.

Calf Veins: No evidence of thrombus. Normal compressibility and flow
on color Doppler imaging.

Superficial Great Saphenous Vein: No evidence of thrombus. Normal
compressibility.

Venous Reflux:  None.

Other Findings:  None.
IMPRESSION: No evidence of deep venous thrombosis in either lower extremity.

## 2023-01-26 DIAGNOSIS — R300 Dysuria: Secondary | ICD-10-CM | POA: Diagnosis not present

## 2023-02-22 DIAGNOSIS — R3912 Poor urinary stream: Secondary | ICD-10-CM | POA: Diagnosis not present

## 2023-02-22 DIAGNOSIS — Z125 Encounter for screening for malignant neoplasm of prostate: Secondary | ICD-10-CM | POA: Diagnosis not present

## 2023-02-22 DIAGNOSIS — R300 Dysuria: Secondary | ICD-10-CM | POA: Diagnosis not present

## 2023-02-22 DIAGNOSIS — N401 Enlarged prostate with lower urinary tract symptoms: Secondary | ICD-10-CM | POA: Diagnosis not present

## 2023-02-27 DIAGNOSIS — Z1331 Encounter for screening for depression: Secondary | ICD-10-CM | POA: Diagnosis not present

## 2023-02-27 DIAGNOSIS — D649 Anemia, unspecified: Secondary | ICD-10-CM | POA: Diagnosis not present

## 2023-02-27 DIAGNOSIS — R351 Nocturia: Secondary | ICD-10-CM | POA: Diagnosis not present

## 2023-02-27 DIAGNOSIS — M542 Cervicalgia: Secondary | ICD-10-CM | POA: Diagnosis not present

## 2023-02-27 DIAGNOSIS — Z720 Tobacco use: Secondary | ICD-10-CM | POA: Diagnosis not present

## 2023-02-27 DIAGNOSIS — Z0000 Encounter for general adult medical examination without abnormal findings: Secondary | ICD-10-CM | POA: Diagnosis not present

## 2023-02-27 DIAGNOSIS — N529 Male erectile dysfunction, unspecified: Secondary | ICD-10-CM | POA: Diagnosis not present

## 2023-02-27 DIAGNOSIS — G8929 Other chronic pain: Secondary | ICD-10-CM | POA: Diagnosis not present

## 2023-02-27 DIAGNOSIS — E43 Unspecified severe protein-calorie malnutrition: Secondary | ICD-10-CM | POA: Diagnosis not present

## 2023-02-27 DIAGNOSIS — N401 Enlarged prostate with lower urinary tract symptoms: Secondary | ICD-10-CM | POA: Diagnosis not present

## 2023-02-27 DIAGNOSIS — Z1211 Encounter for screening for malignant neoplasm of colon: Secondary | ICD-10-CM | POA: Diagnosis not present

## 2023-02-27 DIAGNOSIS — F5104 Psychophysiologic insomnia: Secondary | ICD-10-CM | POA: Diagnosis not present

## 2023-04-14 DIAGNOSIS — R768 Other specified abnormal immunological findings in serum: Secondary | ICD-10-CM | POA: Diagnosis not present

## 2023-06-26 DIAGNOSIS — R636 Underweight: Secondary | ICD-10-CM | POA: Diagnosis not present

## 2023-06-26 DIAGNOSIS — Z0000 Encounter for general adult medical examination without abnormal findings: Secondary | ICD-10-CM | POA: Diagnosis not present

## 2023-06-26 DIAGNOSIS — N529 Male erectile dysfunction, unspecified: Secondary | ICD-10-CM | POA: Diagnosis not present

## 2023-06-26 DIAGNOSIS — F17200 Nicotine dependence, unspecified, uncomplicated: Secondary | ICD-10-CM | POA: Diagnosis not present

## 2023-06-26 DIAGNOSIS — R351 Nocturia: Secondary | ICD-10-CM | POA: Diagnosis not present

## 2023-06-26 DIAGNOSIS — R413 Other amnesia: Secondary | ICD-10-CM | POA: Diagnosis not present

## 2023-09-25 DIAGNOSIS — Z681 Body mass index (BMI) 19 or less, adult: Secondary | ICD-10-CM | POA: Diagnosis not present

## 2023-09-25 DIAGNOSIS — R2681 Unsteadiness on feet: Secondary | ICD-10-CM | POA: Diagnosis not present

## 2023-09-25 DIAGNOSIS — Z008 Encounter for other general examination: Secondary | ICD-10-CM | POA: Diagnosis not present

## 2023-09-25 DIAGNOSIS — J410 Simple chronic bronchitis: Secondary | ICD-10-CM | POA: Diagnosis not present

## 2023-09-25 DIAGNOSIS — F17210 Nicotine dependence, cigarettes, uncomplicated: Secondary | ICD-10-CM | POA: Diagnosis not present

## 2023-09-25 DIAGNOSIS — R636 Underweight: Secondary | ICD-10-CM | POA: Diagnosis not present

## 2023-10-23 ENCOUNTER — Emergency Department
Admission: EM | Admit: 2023-10-23 | Discharge: 2023-10-23 | Disposition: A | Discharge status: Home / Self Care | Attending: Emergency Medicine | Admitting: Emergency Medicine

## 2023-10-23 ENCOUNTER — Emergency Department

## 2023-10-23 ENCOUNTER — Other Ambulatory Visit: Payer: Self-pay

## 2023-10-23 DIAGNOSIS — F29 Unspecified psychosis not due to a substance or known physiological condition: Secondary | ICD-10-CM | POA: Diagnosis not present

## 2023-10-23 DIAGNOSIS — R410 Disorientation, unspecified: Secondary | ICD-10-CM | POA: Diagnosis not present

## 2023-10-23 DIAGNOSIS — Z720 Tobacco use: Secondary | ICD-10-CM | POA: Diagnosis not present

## 2023-10-23 DIAGNOSIS — I951 Orthostatic hypotension: Secondary | ICD-10-CM | POA: Insufficient documentation

## 2023-10-23 DIAGNOSIS — Z79899 Other long term (current) drug therapy: Secondary | ICD-10-CM | POA: Insufficient documentation

## 2023-10-23 DIAGNOSIS — R42 Dizziness and giddiness: Secondary | ICD-10-CM | POA: Diagnosis not present

## 2023-10-23 DIAGNOSIS — I6523 Occlusion and stenosis of bilateral carotid arteries: Secondary | ICD-10-CM | POA: Diagnosis not present

## 2023-10-23 DIAGNOSIS — I10 Essential (primary) hypertension: Secondary | ICD-10-CM | POA: Diagnosis not present

## 2023-10-23 DIAGNOSIS — R4182 Altered mental status, unspecified: Secondary | ICD-10-CM | POA: Diagnosis not present

## 2023-10-23 LAB — CBC WITH DIFFERENTIAL/PLATELET
Abs Immature Granulocytes: 0.04 K/uL (ref 0.00–0.07)
Basophils Absolute: 0.1 K/uL (ref 0.0–0.1)
Basophils Relative: 1 %
Eosinophils Absolute: 0.2 K/uL (ref 0.0–0.5)
Eosinophils Relative: 3 %
HCT: 39.3 % (ref 39.0–52.0)
Hemoglobin: 12.9 g/dL — ABNORMAL LOW (ref 13.0–17.0)
Immature Granulocytes: 0 %
Lymphocytes Relative: 16 %
Lymphs Abs: 1.5 K/uL (ref 0.7–4.0)
MCH: 30.6 pg (ref 26.0–34.0)
MCHC: 32.8 g/dL (ref 30.0–36.0)
MCV: 93.3 fL (ref 80.0–100.0)
Monocytes Absolute: 0.7 K/uL (ref 0.1–1.0)
Monocytes Relative: 7 %
Neutro Abs: 7.1 K/uL (ref 1.7–7.7)
Neutrophils Relative %: 73 %
Platelets: 341 K/uL (ref 150–400)
RBC: 4.21 MIL/uL — ABNORMAL LOW (ref 4.22–5.81)
RDW: 14 % (ref 11.5–15.5)
WBC: 9.7 K/uL (ref 4.0–10.5)
nRBC: 0 % (ref 0.0–0.2)

## 2023-10-23 LAB — COMPREHENSIVE METABOLIC PANEL WITH GFR
ALT: 12 U/L (ref 0–44)
AST: 16 U/L (ref 15–41)
Albumin: 3.5 g/dL (ref 3.5–5.0)
Alkaline Phosphatase: 79 U/L (ref 38–126)
Anion gap: 7 (ref 5–15)
BUN: 27 mg/dL — ABNORMAL HIGH (ref 8–23)
CO2: 26 mmol/L (ref 22–32)
Calcium: 8.8 mg/dL — ABNORMAL LOW (ref 8.9–10.3)
Chloride: 105 mmol/L (ref 98–111)
Creatinine, Ser: 0.83 mg/dL (ref 0.61–1.24)
GFR, Estimated: >60 mL/min (ref >60)
Glucose, Bld: 107 mg/dL — ABNORMAL HIGH (ref 70–99)
Potassium: 4.1 mmol/L (ref 3.5–5.1)
Sodium: 138 mmol/L (ref 135–145)
Total Bilirubin: 0.4 mg/dL (ref 0.0–1.2)
Total Protein: 7 g/dL (ref 6.5–8.1)

## 2023-10-23 LAB — URINALYSIS, W/ REFLEX TO CULTURE (INFECTION SUSPECTED)
Bacteria, UA: NONE SEEN
Bilirubin Urine: NEGATIVE
Glucose, UA: NEGATIVE mg/dL
Hgb urine dipstick: NEGATIVE
Ketones, ur: NEGATIVE mg/dL
Leukocytes,Ua: NEGATIVE
Nitrite: NEGATIVE
Protein, ur: NEGATIVE mg/dL
Specific Gravity, Urine: 1.023 (ref 1.005–1.030)
Squamous Epithelial / HPF: 0 /HPF (ref 0–5)
pH: 5 (ref 5.0–8.0)

## 2023-10-23 LAB — ETHANOL: Alcohol, Ethyl (B): <15 mg/dL (ref <15)

## 2023-10-23 LAB — TROPONIN I (HIGH SENSITIVITY): Troponin I (High Sensitivity): 5 ng/L (ref <18)

## 2023-10-23 LAB — URINE DRUG SCREEN, QUALITATIVE (ARMC ONLY)
Amphetamines, Ur Screen: NOT DETECTED
Barbiturates, Ur Screen: NOT DETECTED
Benzodiazepine, Ur Scrn: NOT DETECTED
Cannabinoid 50 Ng, Ur ~~LOC~~: POSITIVE — AB
Cocaine Metabolite,Ur ~~LOC~~: NOT DETECTED
MDMA (Ecstasy)Ur Screen: NOT DETECTED
Methadone Scn, Ur: NOT DETECTED
Opiate, Ur Screen: NOT DETECTED
Phencyclidine (PCP) Ur S: NOT DETECTED
Tricyclic, Ur Screen: POSITIVE — AB

## 2023-10-23 LAB — AMMONIA: Ammonia: <13 umol/L (ref 9–35)

## 2023-10-23 LAB — PROTIME-INR
INR: 1 (ref 0.8–1.2)
Prothrombin Time: 13.7 s (ref 11.4–15.2)

## 2023-10-23 MED ORDER — THIAMINE HCL 100 MG/ML IJ SOLN
100.0000 mg | Freq: Once | INTRAMUSCULAR | Status: AC
  Administered 2023-10-23: 100 mg via INTRAVENOUS
  Filled 2023-10-23: qty 2

## 2023-10-23 MED ORDER — SODIUM CHLORIDE 0.9 % IV BOLUS (SEPSIS)
1000.0000 mL | Freq: Once | INTRAVENOUS | Status: AC
  Administered 2023-10-23: 1000 mL via INTRAVENOUS

## 2023-10-23 NOTE — ED Provider Notes (Signed)
 [facility] Provider Note    Event Date/Time   First MD Initiated Contact with Patient 10/23/23 [phone]     (approximate)   History   Dizziness   HPI  Charles Ray is a 73 y.o. male with history of alcohol use disorder, tobacco use who presents to the emergency department with EMS for concerns for lightheadedness and feeling like he was confused.  States he woke up and called his sister because he was feeling poorly.  She advised him to call 911.  He lives at home alone.  He does report that he fell 2 weeks ago but denies hitting his head or any other injury.  He denies chest pain or shortness of breath, fevers, cough, vomiting, diarrhea, bloody stools, melena, numbness, tingling or weakness.   History provided by patient, EMS.    No past medical history on file.  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 Authorizing Provider  acetaminophen  (TYLENOL ) 325 MG tablet Take 2 tablets (650 mg total) by mouth every 6 (six) hours as needed for mild pain or headache (fever >/= 101). 01/08/21   Dickie Begun, MD  albuterol  (VENTOLIN  HFA) 108 (90 Base) MCG/ACT inhaler Inhale 2 puffs into the lungs every 4 (four) hours as needed for wheezing or shortness of breath.    [provider]  amitriptyline  (ELAVIL ) 150 MG tablet Take 300 mg by mouth at bedtime.    [provider]  chlorpheniramine-HYDROcodone  (TUSSIONEX) 10-8 MG/5ML SUER Take 5 mLs by mouth every 12 (twelve) hours as needed for cough. 01/08/21   Dickie Begun, MD  doxycycline  (VIBRA -TABS) 100 MG tablet Take 1 tablet (100 mg total) by mouth 2 (two) times daily. 07/07/21   Cuthriell, Dorn BIRCH, PA-C  feeding supplement (ENSURE  ENLIVE / ENSURE PLUS) LIQD Take 237 mLs by mouth 3 (three) times daily between meals. 01/08/21   Dickie Begun, MD  folic acid  (FOLVITE ) 1 MG tablet Take 1 tablet (1 mg total) by mouth daily. 01/08/21   Dickie Begun, MD  hydrocortisone  cream 1 % Apply topically 3 (three) times daily as needed for itching. 01/08/21   Dickie Begun, MD  lipase/protease/amylase (CREON ) 12000-38000 units CPEP capsule Take 1 capsule (12,000 Units total) by mouth 3 (three) times daily with meals. 01/08/21   Dickie Begun, MD  midodrine  (PROAMATINE ) 2.5 MG tablet Take 1 tablet (2.5 mg total) by mouth 3 (three) times daily with meals. 01/08/21   Dickie Begun, MD  Multiple Vitamin (MULTIVITAMIN WITH MINERALS) TABS tablet Take 1 tablet by mouth daily. 01/08/21   Dickie Begun, MD  nicotine  (NICODERM CQ  - DOSED IN MG/24 HOURS) 21 mg/24hr patch Place 1 patch (21 mg total) onto the skin daily. 01/08/21   Dickie Begun, MD  phenazopyridine  (PYRIDIUM ) 100 MG tablet Take 1 tablet (100 mg total) by mouth 3 (three) times daily as needed for up to 6 doses for pain. 07/07/21   Cuthriell, Dorn BIRCH, PA-C  senna-docusate (SENOKOT-S) 8.6-50 MG tablet Take 2 tablets by mouth 2 (two) times daily. 01/08/21   Dickie Begun, MD  thiamine  100 MG tablet Take 1 tablet (100 mg total) by mouth daily. 01/08/21   Dickie Begun, MD    Physical Exam   Triage Vital  Signs: ED Triage Vitals  Encounter Vitals Group     BP 10/23/23 0515 113/72     Girls Systolic BP Percentile --      Girls Diastolic BP Percentile --      Boys Systolic BP Percentile --      Boys Diastolic BP Percentile --      Pulse Rate 10/23/23 0515 94     Resp 10/23/23 0515 18     Temp 10/23/23 0515 (!) 97.4 F (36.3 C)     Temp Source 10/23/23 0515 Oral     SpO2 10/23/23 0506 97 %     Weight 10/23/23 0516 133 lb 2.5 oz (60.4 kg)     Height 10/23/23 0516 6' 7 (2.007 m)     Head Circumference --      Peak Flow --      Pain Score 10/23/23 0516 0     Pain Loc --      Pain Education --       Exclude from Growth Chart --     Most recent vital signs: Vitals:   10/23/23 0730 10/23/23 0740  BP: 137/60   Pulse: 65 65  Resp: (!) 22 18  Temp:    SpO2: 99% 98%    CONSTITUTIONAL: Alert, responds appropriately to questions.  Elderly, thin, in no distress, oriented x 4 HEAD: Normocephalic, atraumatic EYES: Conjunctivae clear, pupils appear equal, sclera nonicteric ENT: normal nose; moist mucous membranes NECK: Supple, normal ROM, no cervical spine tenderness or step-off or deformity CARD: RRR; S1 and S2 appreciated RESP: Normal chest excursion without splinting or tachypnea; breath sounds clear and equal bilaterally; no wheezes, no rhonchi, no rales, no hypoxia or respiratory distress, speaking full sentences ABD/GI: Non-distended; soft, non-tender, no rebound, no guarding, no peritoneal signs BACK: The back appears normal EXT: Normal ROM in all joints; no deformity noted, no edema, no calf tenderness or calf swelling SKIN: Normal color for age and race; warm; no rash on exposed skin NEURO: Moves all extremities equally, normal speech, no pronator drift, sensation to light touch intact diffusely, cranial nerves II through XII intact, normal gait PSYCH: The patient's mood and manner are appropriate.   ED Results / Procedures / Treatments   LABS: (all labs ordered are listed, but only abnormal results are displayed) Labs Reviewed  CBC WITH DIFFERENTIAL/PLATELET - Abnormal; Notable for the following components:      Result Value   RBC 4.21 (*)    Hemoglobin 12.9 (*)    All other components within normal limits  COMPREHENSIVE METABOLIC PANEL WITH GFR - Abnormal; Notable for the following components:   Glucose, Bld 107 (*)    BUN 27 (*)    Calcium 8.8 (*)    All other components within normal limits  URINALYSIS, W/ REFLEX TO CULTURE (INFECTION SUSPECTED) - Abnormal; Notable for the following components:   Color, Urine YELLOW (*)    APPearance CLEAR (*)    All other  components within normal limits  URINE DRUG SCREEN, QUALITATIVE ([facility] ONLY) - Abnormal; Notable for the following components:   Tricyclic, Ur Screen POSITIVE (*)    Cannabinoid 50 Ng, Ur [place] POSITIVE (*)    All other components within normal limits  ETHANOL  AMMONIA  PROTIME-INR  TROPONIN I (HIGH SENSITIVITY)     EKG:  EKG Interpretation Date/Time:  Monday October 23 2023 05:09:48 EDT Ventricular Rate:  79 PR Interval:  178 QRS Duration:  97 QT Interval:  395 QTC Calculation: 453 R Axis:  27  Text Interpretation: Sinus rhythm Probable left atrial enlargement Inferior infarct, old Lateral leads are also involved Confirmed by Neomi Neptune [phone]) on 10/23/2023 5:18:03 AM         RADIOLOGY: My personal review and interpretation of imaging: CT head shows no acute abnormality.  I have personally reviewed all radiology reports.   CT HEAD WO CONTRAST ( ) Result Date: 10/23/2023 EXAM: CT HEAD WITHOUT 10/23/2023 05:27:41 AM TECHNIQUE: CT of the head was performed without the administration of intravenous contrast. Automated exposure control, iterative reconstruction, and/or weight based adjustment of the mA/kV was utilized to reduce the radiation dose to as low as reasonably achievable. COMPARISON: CT head without contrast 12/28/2020. CLINICAL HISTORY: Mental status change, unknown cause. Pt BIB [facility] from home, pt woke up 1H ago and c/o feeling lost in his home and dizziness with standing. EMS pt disoriented to time, - stroke screen and ambulated independently to stretcher. Pt A\T\O x's 4 upon arrival. Dr. Neomi at bedside. LKN @ 2300. FINDINGS: BRAIN AND VENTRICLES: No acute intracranial hemorrhage. No mass effect or midline shift. No extra-axial fluid collection. Gray-white differentiation is maintained. No hydrocephalus. A remote lacunar infarct is again noted in the right basal ganglia. Moderate atrophy and white matter changes are similar to the prior exam. No other acute or focal  cortical infarct is present. ORBITS: No acute abnormality. SINUSES AND MASTOIDS: No acute abnormality. SOFT TISSUES AND SKULL: No acute skull fracture. No acute soft tissue abnormality. VASCULATURE: Atherosclerotic calcifications are present in the cavernous carotid arteries bilaterally. No hyperdense vessel is present. IMPRESSION: 1. No acute intracranial abnormality. 2. Remote lacunar infarct in the right basal ganglia. 3. Moderate atrophy and white matter changes, similar to the prior exam. 4. 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 Electronically signed by: Lonni Necessary MD 10/23/2023 05:36 AM EDT RP Workstation: [id number]     PROCEDURES:  Critical Care performed: No    .1-3 Lead EKG Interpretation  Performed by: Trea Carnegie, Neptune SAILOR, DO Authorized by: Creedon Danielski, Neptune SAILOR, DO     Interpretation: normal     ECG rate:  65   ECG rate assessment: normal     Rhythm: sinus rhythm     Ectopy: none     Conduction: normal       IMPRESSION / MDM / ASSESSMENT AND PLAN / ED COURSE  I reviewed the triage vital signs and the nursing notes.    Patient here with dizziness, confusion.  Currently oriented x 4, neurologically intact.  The patient is on the cardiac monitor to evaluate for evidence of arrhythmia and/or significant heart rate changes.   DIFFERENTIAL DIAGNOSIS (includes but not limited to):   Dehydration, orthostasis, anemia, electrolyte derangement, arrhythmia, ACS, stroke, intracranial hemorrhage   Patient's presentation is most consistent with acute presentation with potential threat to life or bodily function.   PLAN: Will obtain labs, urine, CT head.  Will give IV fluids, IV thiamine .  No signs of current alcohol withdrawal.  States it has been about a week since his last drink.   MEDICATIONS GIVEN IN ED: Medications  sodium chloride  0.9 % bolus 1,000 mL (0 mLs Intravenous Stopped 10/23/23 0627)  thiamine  (VITAMIN B1) injection 100  mg (100 mg Intravenous Given 10/23/23 0543)  sodium chloride  0.9 % bolus 1,000 mL (0 mLs Intravenous Stopped 10/23/23 0800)     ED COURSE: Patient's labs show hemoglobin of 12.9.  Normal electrolytes.  Negative troponin.  Negative ethanol level, normal ammonia and INR.  Urine shows  no sign of infection.  Drug screen positive for tricyclics.  He is on Elavil .  Also positive for cannabinoids.  CT head reviewed and interpreted by myself and the radiologist and shows no acute abnormality.  Patient reports feeling better but is orthostatic after a liter of fluids.  Will give second liter, encourage oral fluids and reassess.   Patient states he is feeling much better.  Tolerating p.o.  Able to ambulate.  Remains hemodynamically stable.  I feel he is safe to be discharged home with his sister.  Recommended increase fluid intake, avoiding alcohol.  They are comfortable with this plan.  Recommended PCP follow-up.   At this time, I do not feel there is any life-threatening condition present. I reviewed all nursing notes, vitals, pertinent previous records.  All lab and urine results, EKGs, imaging ordered have been independently reviewed and interpreted by myself.  I reviewed all available radiology reports from any imaging ordered this visit.  Based on my assessment, I feel the patient is safe to be discharged home without further emergent workup and can continue workup as an outpatient as needed. Discussed all findings, treatment plan as well as usual and customary return precautions.  They verbalize understanding and are comfortable with this plan.  Outpatient follow-up has been provided as needed.  All questions have been answered.    CONSULTS: Admission considered but workup reassuring and patient feeling better after IV hydration.  I feel he is appropriate for further outpatient management.   OUTSIDE RECORDS REVIEWED: Reviewed last internal medicine visit on 06/26/2023.       FINAL CLINICAL  IMPRESSION(S) / ED DIAGNOSES   Final diagnoses:  Lightheadedness  Orthostasis     Rx / DC Orders   ED Discharge Orders     None        Note:  This document was prepared using Dragon voice recognition software and may include unintentional dictation errors.   Lylia Karn, Josette SAILOR, DO 10/23/23 [phone]

## 2023-10-23 NOTE — ED Notes (Addendum)
 Pt resting at this time. Alert and denies needs at this time. Fall risk bundle in place.

## 2023-10-23 NOTE — ED Triage Notes (Signed)
 Pt BIB [facility] from home, pt woke up 1H ago and c/o feeling lost in his home and dizziness with standing. EMS pt disoriented to time, - stroke screen and ambulated independently to stretcher. Pt A&O x's 4 upon arrival. Dr. Neomi at bedside. LKN @ 2300 7/13

## 2023-11-06 DIAGNOSIS — R300 Dysuria: Secondary | ICD-10-CM | POA: Diagnosis not present
